# Patient Record
Sex: Female | Born: 1980 | Hispanic: Yes | Marital: Single | Smoking: Never smoker
Health system: Southern US, Community
[De-identification: ages and names within clinical notes are randomized; demographics above are authoritative.]

## PROBLEM LIST (undated history)

## (undated) DIAGNOSIS — Z21 Asymptomatic human immunodeficiency virus [HIV] infection status: Secondary | ICD-10-CM

## (undated) DIAGNOSIS — B20 Human immunodeficiency virus [HIV] disease: Secondary | ICD-10-CM

## (undated) DIAGNOSIS — B999 Unspecified infectious disease: Secondary | ICD-10-CM

## (undated) DIAGNOSIS — F32A Depression, unspecified: Secondary | ICD-10-CM

## (undated) HISTORY — PX: NO PAST SURGERIES: SHX2092

## (undated) HISTORY — DX: Asymptomatic human immunodeficiency virus (hiv) infection status: Z21

## (undated) HISTORY — DX: Depression, unspecified: F32.A

## (undated) HISTORY — DX: Unspecified infectious disease: B99.9

## (undated) HISTORY — DX: Human immunodeficiency virus (HIV) disease: B20

---

## 2009-11-06 ENCOUNTER — Encounter: Payer: Self-pay | Admitting: Internal Medicine

## 2009-11-06 LAB — HIV ANTIBODY (ROUTINE TESTING W REFLEX): HIV: REACTIVE

## 2009-11-24 ENCOUNTER — Encounter: Payer: Self-pay | Admitting: Internal Medicine

## 2009-12-12 ENCOUNTER — Ambulatory Visit: Payer: Self-pay | Admitting: Internal Medicine

## 2009-12-12 DIAGNOSIS — B20 Human immunodeficiency virus [HIV] disease: Secondary | ICD-10-CM

## 2009-12-12 LAB — CONVERTED CEMR LAB
Albumin: 4.4 g/dL (ref 3.5–5.2)
Basophils Relative: 0 % (ref 0–1)
Bilirubin Urine: NEGATIVE
CO2: 23 meq/L (ref 19–32)
Cholesterol: 132 mg/dL (ref 0–200)
Eosinophils Relative: 1 % (ref 0–5)
GC Probe Amp, Urine: NEGATIVE
Glucose, Bld: 90 mg/dL (ref 70–99)
HCT: 42 % (ref 36.0–46.0)
HIV-1 antibody: POSITIVE — AB
HIV: REACTIVE
Hemoglobin: 13.8 g/dL (ref 12.0–15.0)
Hep A Total Ab: POSITIVE — AB
Hep B S Ab: NEGATIVE
Ketones, ur: NEGATIVE mg/dL
MCHC: 32.9 g/dL (ref 30.0–36.0)
Monocytes Absolute: 0.3 10*3/uL (ref 0.1–1.0)
Monocytes Relative: 6 % (ref 3–12)
Neutro Abs: 3.2 10*3/uL (ref 1.7–7.7)
Nitrite: NEGATIVE
Potassium: 3.7 meq/L (ref 3.5–5.3)
RBC: 5.14 M/uL — ABNORMAL HIGH (ref 3.87–5.11)
Sodium: 138 meq/L (ref 135–145)
Specific Gravity, Urine: 1.016 (ref 1.005–1.030)
Total Bilirubin: 0.4 mg/dL (ref 0.3–1.2)
Total Protein: 7.4 g/dL (ref 6.0–8.3)
Triglycerides: 176 mg/dL — ABNORMAL HIGH (ref <150)
Urine Glucose: NEGATIVE mg/dL
pH: 8 (ref 5.0–8.0)

## 2009-12-28 ENCOUNTER — Ambulatory Visit: Payer: Self-pay | Admitting: Internal Medicine

## 2009-12-29 ENCOUNTER — Encounter (INDEPENDENT_AMBULATORY_CARE_PROVIDER_SITE_OTHER): Payer: Self-pay | Admitting: *Deleted

## 2010-01-03 ENCOUNTER — Encounter (INDEPENDENT_AMBULATORY_CARE_PROVIDER_SITE_OTHER): Payer: Self-pay | Admitting: *Deleted

## 2010-01-03 ENCOUNTER — Telehealth: Payer: Self-pay | Admitting: Internal Medicine

## 2010-01-04 ENCOUNTER — Encounter (INDEPENDENT_AMBULATORY_CARE_PROVIDER_SITE_OTHER): Payer: Self-pay | Admitting: *Deleted

## 2010-01-08 ENCOUNTER — Encounter (INDEPENDENT_AMBULATORY_CARE_PROVIDER_SITE_OTHER): Payer: Self-pay | Admitting: *Deleted

## 2010-02-08 ENCOUNTER — Encounter (INDEPENDENT_AMBULATORY_CARE_PROVIDER_SITE_OTHER): Payer: Self-pay | Admitting: *Deleted

## 2010-02-12 ENCOUNTER — Encounter (INDEPENDENT_AMBULATORY_CARE_PROVIDER_SITE_OTHER): Payer: Self-pay | Admitting: *Deleted

## 2010-02-12 ENCOUNTER — Ambulatory Visit: Payer: Self-pay | Admitting: Internal Medicine

## 2010-02-12 LAB — CONVERTED CEMR LAB
Albumin: 4.3 g/dL (ref 3.5–5.2)
Alkaline Phosphatase: 61 units/L (ref 39–117)
BUN: 7 mg/dL (ref 6–23)
Eosinophils Absolute: 0.1 10*3/uL (ref 0.0–0.7)
Eosinophils Relative: 2 % (ref 0–5)
Glucose, Bld: 86 mg/dL (ref 70–99)
HCT: 39.8 % (ref 36.0–46.0)
HIV-1 RNA Quant, Log: 2.24 — ABNORMAL HIGH (ref <1.68)
Lymphs Abs: 1.1 10*3/uL (ref 0.7–4.0)
MCV: 84.9 fL (ref 78.0–100.0)
Monocytes Relative: 10 % (ref 3–12)
Potassium: 3.8 meq/L (ref 3.5–5.3)
RBC: 4.69 M/uL (ref 3.87–5.11)
WBC: 4.7 10*3/uL (ref 4.0–10.5)

## 2010-03-01 ENCOUNTER — Ambulatory Visit: Payer: Self-pay | Admitting: Internal Medicine

## 2010-04-11 ENCOUNTER — Encounter (INDEPENDENT_AMBULATORY_CARE_PROVIDER_SITE_OTHER): Payer: Self-pay | Admitting: *Deleted

## 2010-05-30 ENCOUNTER — Ambulatory Visit: Payer: Self-pay | Admitting: Internal Medicine

## 2010-05-30 LAB — CONVERTED CEMR LAB
Albumin: 4.9 g/dL (ref 3.5–5.2)
CO2: 25 meq/L (ref 19–32)
Calcium: 8.9 mg/dL (ref 8.4–10.5)
Chloride: 104 meq/L (ref 96–112)
Eosinophils Relative: 2 % (ref 0–5)
Glucose, Bld: 89 mg/dL (ref 70–99)
HCT: 38.8 % (ref 36.0–46.0)
HIV 1 RNA Quant: <20 copies/mL (ref <20)
HIV-1 RNA Quant, Log: <1.3 (ref <1.30)
Lymphocytes Relative: 20 % (ref 12–46)
Lymphs Abs: 1.5 10*3/uL (ref 0.7–4.0)
Neutro Abs: 5.3 10*3/uL (ref 1.7–7.7)
Neutrophils Relative %: 72 % (ref 43–77)
Platelets: 313 10*3/uL (ref 150–400)
Potassium: 3.9 meq/L (ref 3.5–5.3)
Sodium: 138 meq/L (ref 135–145)
Total Protein: 7.7 g/dL (ref 6.0–8.3)
WBC: 7.4 10*3/uL (ref 4.0–10.5)

## 2010-06-13 ENCOUNTER — Ambulatory Visit: Payer: Self-pay | Admitting: Internal Medicine

## 2010-06-13 DIAGNOSIS — M25569 Pain in unspecified knee: Secondary | ICD-10-CM | POA: Insufficient documentation

## 2010-08-28 ENCOUNTER — Ambulatory Visit
Admission: RE | Admit: 2010-08-28 | Discharge: 2010-08-28 | Payer: Self-pay | Source: Home / Self Care | Attending: Internal Medicine | Admitting: Internal Medicine

## 2010-08-28 ENCOUNTER — Encounter: Payer: Self-pay | Admitting: Adult Health

## 2010-08-28 LAB — CONVERTED CEMR LAB
AST: 23 units/L (ref 0–37)
Albumin: 4.4 g/dL (ref 3.5–5.2)
Alkaline Phosphatase: 94 units/L (ref 39–117)
BUN: 11 mg/dL (ref 6–23)
Basophils Absolute: 0 10*3/uL (ref 0.0–0.1)
Basophils Relative: 1 % (ref 0–1)
CO2: 27 meq/L (ref 19–32)
Calcium: 9.6 mg/dL (ref 8.4–10.5)
Creatinine, Ser: 0.63 mg/dL (ref 0.40–1.20)
HIV-1 RNA Quant, Log: <1.3 (ref <1.30)
MCHC: 33.9 g/dL (ref 30.0–36.0)
Neutro Abs: 3.7 10*3/uL (ref 1.7–7.7)
Neutrophils Relative %: 61 % (ref 43–77)
RBC: 4.28 M/uL (ref 3.87–5.11)
RDW: 12.6 % (ref 11.5–15.5)
Total Bilirubin: 0.2 mg/dL — ABNORMAL LOW (ref 0.3–1.2)
Total Protein: 7.4 g/dL (ref 6.0–8.3)

## 2010-08-28 NOTE — Assessment & Plan Note (Signed)
Summary: F/U/VS   CC:  follow-up visit, lab results, and c/o bilateral knee pain x 6 days.  History of Present Illness: (Interpretor utilized) Pt taking her Atripla every day.  No side effects. C/o bilateral knee pain when she bends down for the last 6 days.  No known injury.  she has not tried any OTC medication for the pain.  Preventive Screening-Counseling & Management  Alcohol-Tobacco     Alcohol drinks/day: 0     Smoking Status: never  Caffeine-Diet-Exercise     Caffeine use/day: occasional soda     Does Patient Exercise: yes     Type of exercise: walking  Safety-Violence-Falls     Seat Belt Use: yes      Sexual History:  n/a.        Drug Use:  never.    Comments: pt. given condoms   Updated Prior Medication List: ATRIPLA 600-200-300 MG TABS (EFAVIRENZ-EMTRICITAB-TENOFOVIR) Take 1 tablet by mouth at bedtime  Current Allergies (reviewed today): No known allergies  Review of Systems  The patient denies anorexia, fever, and weight loss.    Vital Signs:  Patient profile:   30 year old female Height:      60.5 inches (153.67 cm) Weight:      149.12 pounds (67.78 kg) BMI:     28.75 Temp:     98.4 degrees F (36.89 degrees C) oral Pulse rate:   73 / minute BP sitting:   108 / 73  (right arm)  Vitals Entered By: Wendall Mola CMA Duncan Dull) (June 13, 2010 9:42 AM) CC: follow-up visit, lab results, c/o bilateral knee pain x 6 days Is Patient Diabetic? No Pain Assessment Patient in pain? yes     Location: knees Intensity: 5 Type: aching Onset of pain  Intermittent Nutritional Status BMI of 25 - 29 = overweight Nutritional Status Detail appetite "good"  Have you ever been in a relationship where you felt threatened, hurt or afraid?No   Does patient need assistance? Functional Status Self care Ambulation Normal Comments pt. has missed one dose of Atirpla since last visit   Physical Exam  General:  alert, well-developed, well-nourished,  and well-hydrated.   Head:  normocephalic and atraumatic.   Mouth:  pharynx pink and moist.   Msk:  normal ROM, no joint tenderness, no joint swelling, and no joint warmth.     Impression & Recommendations:  Problem # 1:  HIV INFECTION (ICD-042)  Pt.s most recent CD4ct was 430and VL <20 .  Pt instructed to continue the current antiretroviral regimen.  Pt encouraged to take medication regularly and not miss doses.  Pt will f/u in 3 months for repeat blood work and will see me 2 weeks later. Influenza vaccine and third Hepatitis vaccine given.  Diagnostics Reviewed:  HIV: HIV positive - not AIDS (12/28/2009)   HIV-Western blot: Positive (12/12/2009)   CD4: 430 (05/31/2010)   WBC: 7.4 (05/30/2010)   Hgb: 13.1 (05/30/2010)   HCT: 38.8 (05/30/2010)   Platelets: 313 (05/30/2010) HIV genotype: See Comment (12/12/2009)   HIV-1 RNA: <20 copies/mL (05/30/2010)   HBSAg: NEG (12/12/2009)  Orders: Est. Patient Level III (99213)Future Orders: T-CD4SP ([facility]) (CD4SP) ... 09/11/2010 T-HIV Viral Load [id number]) ... 09/11/2010 T-Comprehensive Metabolic Panel [id number]) ... 09/11/2010 T-CBC w/Diff ([id number]) ... 09/11/2010  Problem # 2:  KNEE PAIN, BILATERAL (ICD-719.46) exam nl trial of Ibuprofen  Other Orders: Influenza Vaccine NON MCR (62952) Hepatitis B Vaccine >79yrs (84132) Admin 1st Vaccine (44010)  Patient Instructions: 1)  Please  schedule a follow-up appointment in 3 months, 2 weeks after labs.       Immunizations Administered:  Influenza Vaccine # 1:    Vaccine Type: Fluvax Non-MCR    Site: left deltoid    Mfr: Novartis    Dose: 0.5 ml    Route: IM    Given by: Wendall Mola CMA ( AAMA)    Exp. Date: 10/28/2010    Lot #: [id number]    VIS given: 02/20/10 version given June 13, 2010.  Hepatitis B Vaccine # 3:    Vaccine Type: HepB Adult    Site: right deltoid    Mfr: Merck    Dose: 0.5 ml    Route: IM    Given by: Wendall Mola CMA ( AAMA)     Exp. Date: 07/16/2012    Lot #: [id number]    VIS given: 02/12/06 version given June 13, 2010.  Flu Vaccine Consent Questions:    Do you have a history of severe allergic reactions to this vaccine? no    Any prior history of allergic reactions to egg and/or gelatin? no    Do you have a sensitivity to the preservative Thimersol? no    Do you have a past history of Guillan-Barre Syndrome? no    Do you currently have an acute febrile illness? no    Have you ever had a severe reaction to latex? no    Vaccine information given and explained to patient? yes    Are you currently pregnant? no

## 2010-08-28 NOTE — Assessment & Plan Note (Signed)
Summary: new 042/tkk   CC:  new patient to establish and lab results.  History of Present Illness: (Interpreter utilized)Pt diagnosed HIV (+) in april at her yearly PE. She currently does not have a partner.  Her risk factor would be heterosexual sex. She has been feeling well.  Not currently employed.  Preventive Screening-Counseling & Management  Alcohol-Tobacco     Alcohol drinks/day: 0     Smoking Status: never  Caffeine-Diet-Exercise     Caffeine use/day: 0     Does Patient Exercise: no  Safety-Violence-Falls     Seat Belt Use: yes      Sexual History:  n/a.        Drug Use:  never.     Current Allergies (reviewed today): No known allergies  Social History: Sexual History:  n/a Drug Use:  never  Review of Systems  The patient denies anorexia, fever, weight loss, and dyspnea on exertion.    Vital Signs:  Patient profile:   30 year old female Height:      60.5 inches (153.67 cm) Weight:      152.8 pounds (69.45 kg) BMI:     29.46 Temp:     98.7 degrees F (37.06 degrees C) oral Pulse rate:   87 / minute BP sitting:   118 / 71  (left arm)  Vitals Entered By: Wendall Mola CMA Duncan Dull) (December 28, 2009 2:36 PM) CC: new patient to establish, lab results Is Patient Diabetic? No Pain Assessment Patient in pain? no      Nutritional Status BMI of 25 - 29 = overweight Nutritional Status Detail appetite "very good"  Have you ever been in a relationship where you felt threatened, hurt or afraid?No   Does patient need assistance? Functional Status Self care Ambulation Normal   Physical Exam  General:  alert, well-developed, well-nourished, and well-hydrated.   Head:  normocephalic and atraumatic.   Mouth:  pharynx pink and moist.  no thrush  Neck:  no cervical lymphadenopathy.   Lungs:  normal breath sounds.   Heart:  normal rate, regular rhythm, and no murmur.          Medication Adherence: 12/28/2009   Adherence to medications reviewed with  patient. Counseling to provide adequate adherence provided   Prevention For Positives: 12/28/2009   Safe sex practices discussed with patient. Condoms offered.                             Impression & Recommendations:  Problem # 1:  HIV INFECTION (ICD-042) Discussed pathophysiology of HIV and the meaning of CD4ct and VL.  Pt.s current Cd4ct is 380  and VL is  29,440 and genotype shows a naive virus .  At this Point antiretroviral treatment is indicated . Discussed treatment options.  Pt does not want to get pregnant and is not sexually active. She would like to start Atripla.  Potential side effects were discussed. I will refer her to [name] to get enrolled in a drug program so she can get her medication.  She will return in 6 weeks for repeat labs.  hepatits B vaccine #1 given today.  Discussed safe sex and transmisiion routes with the patient.  Diagnostics Reviewed:  HIV: REACTIVE (12/12/2009)   HIV-Western blot: Positive (12/12/2009)   CD4: 380 (12/13/2009)   WBC: 5.0 (12/12/2009)   Hgb: 13.8 (12/12/2009)   HCT: 42.0 (12/12/2009)   Platelets: 278 (12/12/2009) HIV genotype: See Comment (12/12/2009)  HIV-1 RNA: 29400 (12/12/2009)   HBSAg: NEG (12/12/2009)  Orders: New Patient Level III (99203)Future Orders: T-CBC w/Diff ([id number]) ... 02/27/2010 T-CD4SP ([facility]) (CD4SP) ... 02/27/2010 T-Comprehensive Metabolic Panel [id number]) ... 02/27/2010 T-HIV Viral Load [id number]) ... 02/27/2010  Medications Added to Medication List This Visit: 1)  Atripla 600-200-300 Mg Tabs (Efavirenz-emtricitab-tenofovir) .... Take 1 tablet by mouth at bedtime  Other Orders: Hepatitis B Vaccine >23yrs [phone]) Admin 1st Vaccine (81829) Admin 1st Vaccine [facility]) [phone])  Patient Instructions: 1)  Please schedule a follow-up appointment in 10 weeks, 2 weeks after labs.    Hepatitis B Vaccine # 1    Vaccine Type: HepB Adult    Site: left deltoid    Mfr: Merck    Dose: 0.5 ml    Route:  IM    Given by: Wendall Mola CMA ( AAMA)    Exp. Date: 04/24/2012    Lot #: [id number]    VIS given: 02/12/06 version given December 28, 2009.          Medication Adherence: 12/28/2009   Adherence to medications reviewed with patient. Counseling to provide adequate adherence provided    Prevention For Positives: 12/28/2009   Safe sex practices discussed with patient. Condoms offered.

## 2010-08-28 NOTE — Miscellaneous (Signed)
Summary: Bridge Counselor  Clinical Lists Changes BC met with pt at the [facility] Department today. BC provided HIV education and treatment adherence. BC discussed how and when to take Atripla and the pregnancy warning. Pt does not yet have her medication due to ADAP pending. BC picked up a sample bottle from [place] to deliver to pt tomorrow.  Sharol Roussel  January 04, 2010 4:47 PM

## 2010-08-28 NOTE — Progress Notes (Signed)
Summary: sample given  Phone Note Call from Patient   Caller: Bridge Counselor Summary of Call: Bridge counselor came by to pick up medication to deliver to patient on tomorrow.  Her ADAP application is still pending approval. Initial call taken by: Paulo Fruit  BS,CPht II,MPH,  January 03, 2010 4:18 PM    Prescriptions: ATRIPLA 600-200-300 MG TABS (EFAVIRENZ-EMTRICITAB-TENOFOVIR) Take 1 tablet by mouth at bedtime  #30 x 0   Entered by:   Paulo Fruit  BS,CPht II,MPH   Authorized by:   Yisroel Ramming MD   Signed by:   Paulo Fruit  BS,CPht II,MPH on 01/03/2010   Method used:   Samples Given   RxID:   [id number]   Sample Given, Lot #: [id number] Expiration Date:03 2013 Patient has been instructed regarding the correct time, dose and frequency of taking this med, including desired effects and most common side effects. Paulo Fruit  BS,CPht II,MPH  January 03, 2010 4:19 PM

## 2010-08-28 NOTE — Assessment & Plan Note (Signed)
Summary: F/U [MKJ]   CC:  follow-up visit, lab results, and c/o red spots on right leg.  History of Present Illness: (Interpreter utilized) Pt missed 3 days of her Atripla because of the transfer from [facility] to [facility]. She is tolerating it well.    Preventive Screening-Counseling & Management  Alcohol-Tobacco     Alcohol drinks/day: 0     Smoking Status: never  Caffeine-Diet-Exercise     Caffeine use/day: occasional soda     Does Patient Exercise: no  Safety-Violence-Falls     Seat Belt Use: yes      Sexual History:  n/a.        Drug Use:  never.     Updated Prior Medication List: ATRIPLA 600-200-300 MG TABS (EFAVIRENZ-EMTRICITAB-TENOFOVIR) Take 1 tablet by mouth at bedtime  Current Allergies: No known allergies  Review of Systems  The patient denies anorexia, fever, and weight loss.    Vital Signs:  Patient profile:   30 year old female Height:      60.5 inches (153.67 cm) Weight:      148.12 pounds (67.33 kg) BMI:     28.55 Temp:     98.0 degrees F (36.67 degrees C) oral Pulse rate:   56 / minute BP sitting:   94 / 65  (left arm)  Vitals Entered By: Wendall Mola CMA Duncan Dull) (March 01, 2010 9:39 AM) CC: follow-up visit, lab results, c/o red spots on right leg Is Patient Diabetic? No Pain Assessment Patient in pain? no      Nutritional Status BMI of 25 - 29 = overweight Nutritional Status Detail appetite "good"  Does patient need assistance? Functional Status Self care Ambulation Normal Comments pt. missed one dose of meds since last visit   Physical Exam  General:  alert, well-developed, well-nourished, and well-hydrated.   Head:  normocephalic and atraumatic.   Lungs:  normal breath sounds.      Impression & Recommendations:  Problem # 1:  HIV INFECTION (ICD-042) Pt.s most recent CD4ct was 390 and VL 173 .  Pt instructed to continue the current antiretroviral regimen.  Pt encouraged to take medication regularly and not miss  doses.  Pt will f/u in 3 months for repeat blood work and will see me 2 weeks later. [place] Hepatitis B vaccine given.  Diagnostics Reviewed:  HIV: HIV positive - not AIDS (12/28/2009)   HIV-Western blot: Positive (12/12/2009)   CD4: 390 (02/13/2010)   WBC: 4.7 (02/12/2010)   Hgb: 13.8 (02/12/2010)   HCT: 39.8 (02/12/2010)   Platelets: 278 (02/12/2010) HIV genotype: See Comment (12/12/2009)   HIV-1 RNA: 173 (02/12/2010)   HBSAg: NEG (12/12/2009)  Orders: Est. Patient Level III (99213)Future Orders: T-CD4SP ([facility]) (CD4SP) ... 05/30/2010 T-HIV Viral Load [id number]) ... 05/30/2010 T-Comprehensive Metabolic Panel [id number]) ... 05/30/2010 T-CBC w/Diff ([id number]) ... 05/30/2010  Other Orders: Hepatitis B Vaccine >17yrs (29528) Admin 1st Vaccine (41324)  Patient Instructions: 1)  Please schedule a follow-up appointment in 3 months, 2 weeks after labs.    Immunizations Administered:  Hepatitis B Vaccine # 2:    Vaccine Type: HepB Adult    Site: left deltoid    Mfr: Merck    Dose: 0.5 ml    Route: IM    Given by: Wendall Mola CMA ( AAMA)    Exp. Date: 11/26/2011    Lot #: [id number]    VIS given: 02/12/06 version given March 01, 2010.

## 2010-08-28 NOTE — Miscellaneous (Signed)
Summary: Bridge Counselor  Clinical Lists Changes BC spoke with pt this date. Pt did receive her medication in the mail and has resumed taking them with no problems. BC will provide transportation to pt's appointment on 02/12/10 at 10am.  Selena Batten [place]  February 08, 2010 11:08 AM

## 2010-08-28 NOTE — Miscellaneous (Signed)
Summary: Bridge Counselor  Clinical Lists Changes BC missed pt at her appointment with Dr. Philipp Deputy. BC will meet with pt at the [facility] Department on June 8,2011 to complete pt's assessment.  Sharol Roussel  December 29, 2009 12:41 PM

## 2010-08-28 NOTE — Letter (Signed)
Summary: Catherine Grimes: Income Verification  Catherine Grimes: Income Verification   Imported By: Florinda Marker 01/02/2010 15:37:01  _____________________________________________________________________  External Attachment:    Type:   Image     Comment:   External Document

## 2010-08-28 NOTE — Miscellaneous (Signed)
Summary: Bridge Counselor  Clinical Lists Changes BC provided transportation to [facility] this date for pt's lab work. Pt also brought in several lab bills that were given to [name] D to be passed to [name]. Pt was told to bring her ryan white card to every clinic visit. Pt will return on August 4th to see Dr. Philipp Deputy.  Sharol Roussel  February 12, 2010 11:28 AM

## 2010-08-28 NOTE — Assessment & Plan Note (Signed)
Summary: new 042/spanish speaking only    Other Orders: T-Comprehensive Metabolic Panel [phone]) T-CBC w/Diff [id number]) T-CD4SP [facility]) (CD4SP) T-Chlamydia  Probe, urine [id number]) T-GC Probe, urine [phone]) T-Hepatitis B Surface Antigen [id number]) T-Hepatitis B Surface Antibody 825 108 0224) T-Hepatitis B Core Antibody [id number]) T-Hepatitis C Antibody ([id number]) T-HIV Antibody  (Reflex) ([id number]) T-HIV Ab Confirmatory Test/Western Blot ([id number]) T-Lipid Profile [id number]) T-RPR (Syphilis) (42706-23762) T-Urinalysis (83151-76160) T-HIV1 Quant rflx Ultra or Genotype ([id number]) T-Hepatitis A Antibody (85462-70350) Pneumococcal Vaccine (09381) Admin 1st Vaccine (82993) TB Skin Test (71696) Admin of Any Addtl Vaccine (78938)     Immunizations Administered:  Pneumonia Vaccine:    Vaccine Type: Pneumovax    Site: left deltoid    Mfr: Merck    Dose: 0.5 ml    Route: IM    Given by: Wendall Mola CMA ( AAMA)    Exp. Date: 05/23/2011    Lot #: [id number]  PPD Skin Test:    Vaccine Type: PPD    Site: left forearm    Mfr: Sanofi Pasteur    Dose: 0.1 ml    Route: ID    Given by: Wendall Mola CMA ( AAMA)    Exp. Date: 12/01/2011    Lot #: [id number]   Appended Document: New 042 intake     Clinical Lists Changes  Observations: Added new observation of TB PPDRESULT: negative (12/12/2009 12:36) Added new observation of PPD RESULT: < 5mm (12/12/2009 12:36) Added new observation of TB-PPD RDDTE: 12/14/2009 (12/12/2009 12:36) Added new observation of INFECTDIS MD: Philipp Deputy (12/12/2009 12:36) Added new observation of DATE1STVISIT: 12/12/2009 (12/12/2009 12:36) Added new observation of MARITAL STAT: Single (12/12/2009 12:36) Added new observation of LATINO/HISP: Yes (12/12/2009 12:36) Added new observation of RWPARTICIP: Yes (12/12/2009 12:36) Added new observation of PREGTHISYR: No (12/12/2009 12:36) Added new  observation of SUBSTANCEABU: 12/12/2009 (12/12/2009 12:36) Added new observation of DEPSCREEN: 12/12/2009 (12/12/2009 12:36) Added new observation of EDUCMATPROV: 12/12/2009 (12/12/2009 12:36) Added new observation of PAP SMEAR: unknown (12/12/2009 12:36) Added new observation of LAST PAP DAT: 11/22/2009 (12/12/2009 12:36) Added new observation of SH REVIEWED: Pt reports sexual intercourse wit 3 female patients in the last year.  One in the last six months  (12/12/2009 12:36) Added new observation of DATE OF LMP: 11-07-09 (12/12/2009 12:36) Added new observation of PREVENTPOS: 12/12/2009 (12/12/2009 12:36) Added new observation of GENDER: Female (12/12/2009 12:36) Added new observation of HIV RISK BEH: Heterosexual contact (12/12/2009 12:36) Added new observation of HIV INIT DX: 11/08/2009 (12/12/2009 12:36) Added new observation of DRUG USE: No (12/12/2009 12:36) Added new observation of ALCOHOL COMM: No (12/12/2009 12:36) Added new observation of TOBACCO USE: never (12/12/2009 12:36) Added new observation of PAYOR: No Insurance (12/12/2009 12:36)      Infectious Disease New Patient Intake Referring MD/Agency: [facility] [place]  Address: [address] [place] [address]  Medical Records: Received Health Insurance / Payor: No Insurance Employer: None   Does insurance cover prescriptions? No Our patient has been informed that medication assistance programs are available.  Our Co-ordinator will be meeting with the patient during this visit to discuss financial and medication assistance.   Do you have a Primary physician: No Are family members aware of patient's diagnosis?  If so, are they supportive? Aunt, cousin  Describe patient's current social support (family, friends, support groups): good  Medical History Medical Problems OTHER than HIV: No   Medication Allergies: No   Medications: No   Medical Hx Comments: Spanish speaking only, interpreter  present. Pt  states he had a Pap Smear at  [facility] Dept on 11-22-09   Family History Heart Disease: No Hypertension: No Diabetes: No High Cholesterol: No Kidney Disease: No Cancer: No Thyroid Disease: No  Tobacco Use: never  Behavioral Health Assessment Have you ever been diagnosed with depression or mental illness? No  Do you drink alcohol? No Do you use recreational drugs? No Do you feel you have a problem with drugs and/or alcohol? No   Have you ever been in a treatment facility for any addiction? No If you are currently using drugs and/or alcohol, would you like help for this addiction? No  HIV Intake Information When did you first test positive for HIV? 11/08/2009 Type of test Conducted: WB   Where was this test performed?  Name of Agency: [facility]  City/State: [place] Was this your first time ever being tested or HIV? Yes Risk Factor(s) for HIV: Heterosexual contact  Method of Exposure to HIV: Heterosexual Intercourse Have you ever been hospitalized for any HIV-related condition? No  Have you ever been under the care of a physician for being HIV positive? No  Newly Diagnosed Patients Has a Disease Intervention Specialist from the Health Department contacted the patient? Yes.   The patient has been informed that the [facility] Department will contact ALL newly reported cases. Health Department Contact:  [phone]   (SSN is needed for confirmation)  Reported Today: 12/05/2009 Health Department Contact:  [phone]            (SSN is needed for confirmation)  Person Reporting: prev. reported/tkk Do you have any Non-HIV related medical conditions or other prior hospitalizations or surgeries? No  HIV Medications Information The patient is currently NOT taking any HIV medications.  Infection History  Patient has been diagnosed with the following opportunistic infections: Are there any other symptoms you need to discuss? No Have  you received literature/education prior to this visit about HIV/AIDS? No Do you understand the meaning of a Viral Load? No Do you understand the meaning of a CD4 count? No Lab Values Education/Handout Given Yes Medication Education/Handout Given Yes  Sexual History Are you in a current relationship? No Are you currently sexually active? No If no, when was your last encounter? 10/06/2009 When was your last unprotected sex? 10/06/2009 Safe Sex Counseling/Pamphlet Given Sexual History Comments: Pt reports sexual intercourse wit 3 female patients in the last year.  One in the last six months   Evaluation and Follow-Up INTAKE CHECK LIST: HIV Education, Safe Sex Counseling, Case Management Referral, HIV Material Given, Juanell Fairly Consent  Prevention For Positives: 12/12/2009   Safe sex practices discussed with patient. Condoms offered. Juanell Fairly Consent: Yes Are you in need of condoms at this time? Yes Our patient has been informed that condoms are always available in this clinic.   Are you involved in any social organization? [facility] Providers Name of Agency: Referral made to [place] home care providers  and Bridge Counselors  Does patient have problems that warrant Social Worker referral? No SW Comments: Sharol Roussel will provide pt with transportation and connection to [facility] Providers .  Obstetric/Gynecological History Pap Smear Date: 11/22/2009 Result:  unknown Office/Clinic:  [facility]   Last Menstrual Period: 11-07-09 Hysterectomy:  No  Pregnancy History Gravida: 1 Para: 0           Prevention For Positives: 12/12/2009   Safe sex practices discussed with patient. Condoms offered.  12/12/2009   Patient was screened for substance abuse and depression. Referal was made as indicated.                       PPD Results    Date of reading: 12/14/2009    Results: < 5mm    Interpretation: negative

## 2010-08-28 NOTE — Miscellaneous (Signed)
Summary: HIPAA Restrictions  HIPAA Restrictions   Imported By: Florinda Marker 12/12/2009 14:53:40  _____________________________________________________________________  External Attachment:    Type:   Image     Comment:   External Document

## 2010-08-28 NOTE — Miscellaneous (Signed)
Summary: RW update  Clinical Lists Changes 

## 2010-08-28 NOTE — Miscellaneous (Signed)
Summary: Bridge Counselor  Clinical Lists Changes BC received pt's letter of support via mail this date. BC gave the letter to [name] D for pt's ADAP.  Sharol Roussel  January 08, 2010 9:39 AM

## 2010-08-28 NOTE — Miscellaneous (Signed)
Summary: Bridge Counselor  Clinical Lists Changes BC delivered pt's medication today. BC also reinforced how and when to take the Atripla. BC also gave pt a pillbox. BC assisted pt with preparing a support letter for her ADAP. Pt will have her aunt sign it and mail back to [facility]. BC will give the letter to Paulo Fruit once received.  Sharol Roussel  January 04, 2010 4:49 PM

## 2010-08-29 LAB — T-HELPER CELL (CD4) - (RCID CLINIC ONLY)
CD4 % Helper T Cell: 35 % (ref 33–55)
CD4 T Cell Abs: 650 uL (ref 400–2700)

## 2010-09-10 ENCOUNTER — Encounter (INDEPENDENT_AMBULATORY_CARE_PROVIDER_SITE_OTHER): Payer: Self-pay | Admitting: *Deleted

## 2010-09-11 ENCOUNTER — Ambulatory Visit: Payer: Self-pay | Admitting: Adult Health

## 2010-09-11 ENCOUNTER — Encounter (INDEPENDENT_AMBULATORY_CARE_PROVIDER_SITE_OTHER): Payer: Self-pay | Admitting: *Deleted

## 2010-09-19 NOTE — Miscellaneous (Signed)
Summary: ADAP update  Clinical Lists Changes  Observations: Added new observation of AIDSDAP: Pending-approval for 2012 (09/11/2010 12:36)

## 2010-09-19 NOTE — Miscellaneous (Signed)
  Clinical Lists Changes  Observations: Added new observation of INCOMESOURCE: UNEMPLOYED (09/10/2010 15:58) Added new observation of HOUSEINCOME: 0  (09/10/2010 15:58) Added new observation of #CHILD<18 IN: No  (09/10/2010 15:58) Added new observation of FAMILYSIZE: 1  (09/10/2010 15:58) Added new observation of HOUSING: Stable/permanent  (09/10/2010 15:58)

## 2010-09-19 NOTE — Miscellaneous (Signed)
Summary: ADAP  Clinical Lists Changes BC completed pt's ADAP eligibility paperwork today and passed all documents to Ardis Rowan for submission.

## 2010-09-21 ENCOUNTER — Encounter (INDEPENDENT_AMBULATORY_CARE_PROVIDER_SITE_OTHER): Payer: Self-pay | Admitting: *Deleted

## 2010-09-25 NOTE — Miscellaneous (Signed)
  Clinical Lists Changes  Observations: Added new observation of HOUSING: stable/permanent (09/21/2010 11:33)

## 2010-10-09 LAB — T-HELPER CELL (CD4) - (RCID CLINIC ONLY): CD4 T Cell Abs: 430 uL (ref 400–2700)

## 2010-11-06 ENCOUNTER — Telehealth: Payer: Self-pay | Admitting: *Deleted

## 2010-11-06 NOTE — Telephone Encounter (Signed)
rec'd message from [place], our interpretor. Pt c/o urinary burning & pain with voids x 2 wks. Worse over the past 4 days. She does not know if pt has a pcp. She will ask her. If so, go to pcp. If not, go to Urgent Care as we have no appts for today & pt needs to be seen today. She will relay this

## 2010-11-08 ENCOUNTER — Ambulatory Visit (INDEPENDENT_AMBULATORY_CARE_PROVIDER_SITE_OTHER): Payer: Self-pay | Admitting: Adult Health

## 2010-11-08 ENCOUNTER — Encounter: Payer: Self-pay | Admitting: Adult Health

## 2010-11-08 VITALS — BP 123/67 | HR 78 | Temp 98.1°F | Wt 145.0 lb

## 2010-11-08 DIAGNOSIS — B20 Human immunodeficiency virus [HIV] disease: Secondary | ICD-10-CM

## 2010-11-08 MED ORDER — EMTRICITAB-RILPIVIR-TENOFOV DF 200-25-300 MG PO TABS: 1.0000 | ORAL_TABLET | Freq: Every day | ORAL | Status: DC

## 2010-11-08 NOTE — Progress Notes (Signed)
  Subjective:    Patient ID: Catherine Grimes, female    DOB: January 14, 1981, 30 y.o.   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  She asked questions regarding risk of infection to infant, risk of infection to her partner, and general risks to her overall health should she become pregnant.    Review of Systems     Objective:   Physical Exam        Assessment & Plan:  A detailed discussion with her, her pastor's wife, and through the interpreter was made.  We discussed the overall risks of vertical transmission with a mother who is virologically controlled and immunocompetent as being significantly low.  We also discussed the risk of infecting her partner as being low as long as she maintains complete virologic suppression as she has been doing.  While it is highly likely she would give normal delivery and carry to term without complication given her current state of good health, we did emphasize that obstetricians still consider pregnancy in HIV as "high risk."  She verbally acknowledged this information.   We also addressed the concern of being on certain medications that may cause fetal abnormalities such as the Atripla she is currently taking (Cat.D).  As she is showing interest in becoming pregnant when she is married, we decided to switch her off Atripla and begin Complera 200-25-300 1 tab qd (Cat. B).  We suggested that at the time of pregnancy, we may consider another treatment option, but for now Complera therapy would be safe enough with the least amount of changes in her treatment schedule.  Drug effects, regimen, SE's, and ADR's reviewed.  Verbally acknowledged information and agreed with plan.  We will have her RTC in 4 weeks to recheck staging labs with a f/u in 6 weeks.

## 2010-11-12 ENCOUNTER — Ambulatory Visit: Payer: Self-pay | Admitting: Adult Health

## 2010-12-14 ENCOUNTER — Inpatient Hospital Stay (HOSPITAL_COMMUNITY): Admission: RE | Admit: 2010-12-14 | Payer: Self-pay | Source: Ambulatory Visit

## 2010-12-14 ENCOUNTER — Ambulatory Visit (INDEPENDENT_AMBULATORY_CARE_PROVIDER_SITE_OTHER): Payer: Self-pay | Admitting: *Deleted

## 2010-12-14 DIAGNOSIS — Z124 Encounter for screening for malignant neoplasm of cervix: Secondary | ICD-10-CM

## 2010-12-14 DIAGNOSIS — R87610 Atypical squamous cells of undetermined significance on cytologic smear of cervix (ASC-US): Secondary | ICD-10-CM

## 2010-12-14 NOTE — Progress Notes (Signed)
  Subjective:     Catherine Grimes is a 30 y.o. woman who comes in today for a  pap smear only.  Review of Systems   Objective:    There were no vitals taken for this visit. Pelvic Exam: Pap smear obtained.   Assessment:    Screening pap smear.   Plan:    Follow up in one year or as indicated by Pap results.

## 2010-12-14 NOTE — Patient Instructions (Signed)
  Your results will be back in a week.  I will send you a letter.  Thank you for coming to the Center for your care

## 2010-12-20 ENCOUNTER — Other Ambulatory Visit: Payer: Self-pay

## 2010-12-20 ENCOUNTER — Encounter: Payer: Self-pay | Admitting: *Deleted

## 2010-12-27 ENCOUNTER — Other Ambulatory Visit (INDEPENDENT_AMBULATORY_CARE_PROVIDER_SITE_OTHER): Payer: Self-pay

## 2010-12-27 ENCOUNTER — Other Ambulatory Visit: Payer: Self-pay | Admitting: Infectious Diseases

## 2010-12-27 DIAGNOSIS — B20 Human immunodeficiency virus [HIV] disease: Secondary | ICD-10-CM

## 2010-12-28 LAB — COMPREHENSIVE METABOLIC PANEL
CO2: 29 mEq/L (ref 19–32)
Creat: 0.79 mg/dL (ref 0.40–1.20)
Glucose, Bld: 79 mg/dL (ref 70–99)
Sodium: 140 mEq/L (ref 135–145)
Total Bilirubin: 0.2 mg/dL — ABNORMAL LOW (ref 0.3–1.2)
Total Protein: 7.1 g/dL (ref 6.0–8.3)

## 2010-12-28 LAB — CBC WITH DIFFERENTIAL/PLATELET
Eosinophils Absolute: 0.2 10*3/uL (ref 0.0–0.7)
Eosinophils Relative: 3 % (ref 0–5)
HCT: 37.6 % (ref 36.0–46.0)
Hemoglobin: 12.7 g/dL (ref 12.0–15.0)
Lymphs Abs: 2 10*3/uL (ref 0.7–4.0)
MCH: 29.8 pg (ref 26.0–34.0)
MCV: 88.3 fL (ref 78.0–100.0)
Monocytes Relative: 6 % (ref 3–12)
RBC: 4.26 MIL/uL (ref 3.87–5.11)

## 2010-12-31 LAB — HIV-1 RNA QUANT-NO REFLEX-BLD
HIV 1 RNA Quant: <20 copies/mL (ref <20)
HIV-1 RNA Quant, Log: <1.3 {Log} (ref <1.30)

## 2011-01-01 ENCOUNTER — Encounter: Payer: Self-pay | Admitting: *Deleted

## 2011-01-01 ENCOUNTER — Telehealth: Payer: Self-pay | Admitting: *Deleted

## 2011-01-01 NOTE — Telephone Encounter (Signed)
Patient mailed letter in Spanish with appointment date and time at [facility].  Scheduled for 02/25/11 at 2:45 pm Wendall Mola CMA

## 2011-01-08 ENCOUNTER — Ambulatory Visit: Payer: Self-pay | Admitting: Adult Health

## 2011-01-10 ENCOUNTER — Ambulatory Visit (INDEPENDENT_AMBULATORY_CARE_PROVIDER_SITE_OTHER): Payer: Self-pay | Admitting: Adult Health

## 2011-01-10 ENCOUNTER — Encounter: Payer: Self-pay | Admitting: Adult Health

## 2011-01-10 VITALS — BP 91/57 | HR 79 | Temp 98.5°F | Wt 149.0 lb

## 2011-01-10 DIAGNOSIS — B20 Human immunodeficiency virus [HIV] disease: Secondary | ICD-10-CM

## 2011-01-10 NOTE — Progress Notes (Signed)
  Subjective:    Patient ID: Catherine Grimes, female    DOB: 06-May-1981, 30 y.o.   MRN: [id number]  HPI Presents to clinic for laboratory followup. Adherent to her new ARV regimen with good tolerance and no complications. Voices no complaints today.   Review of Systems  Constitutional: Negative.   HENT: Negative.   Eyes: Negative.   Respiratory: Negative.   Cardiovascular: Negative.   Gastrointestinal: Negative.   Genitourinary: Negative.   Musculoskeletal: Negative.   Skin: Negative.   Neurological: Negative.   Hematological: Negative.   Psychiatric/Behavioral: Negative.        Objective:   Physical Exam  Constitutional: She is oriented to person, place, and time. She appears well-developed and well-nourished. No distress.  HENT:  Head: Normocephalic and atraumatic.  Eyes: Conjunctivae and EOM are normal. Pupils are equal, round, and reactive to light.  Neck: Normal range of motion. Neck supple.  Cardiovascular: Normal rate and regular rhythm.   Pulmonary/Chest: Effort normal.  Abdominal: Soft. Bowel sounds are normal.  Musculoskeletal: Normal range of motion.  Neurological: She is alert and oriented to person, place, and time. No cranial nerve deficit. She exhibits normal muscle tone. Coordination normal.  Skin: Skin is warm and dry.  Psychiatric: She has a normal mood and affect. Her behavior is normal. Judgment and thought content normal.          Assessment & Plan:  1. HIV. Labs obtained 12/27/2010 show a CD4 count of 790 at 39% with a viral load of less than 20 copies/mL. Clinically stable on current regimen. Recommend continuing present management, repeating labs in 6 weeks for followup in 2 months. Verbally acknowledged this information and agreed with plan of care.

## 2011-01-31 ENCOUNTER — Other Ambulatory Visit: Payer: Self-pay

## 2011-02-25 ENCOUNTER — Encounter: Payer: Self-pay | Admitting: Family Medicine

## 2011-03-05 ENCOUNTER — Other Ambulatory Visit (INDEPENDENT_AMBULATORY_CARE_PROVIDER_SITE_OTHER): Payer: Self-pay

## 2011-03-05 ENCOUNTER — Other Ambulatory Visit: Payer: Self-pay | Admitting: Adult Health

## 2011-03-05 DIAGNOSIS — B20 Human immunodeficiency virus [HIV] disease: Secondary | ICD-10-CM

## 2011-03-06 LAB — T-HELPER CELL (CD4) - (RCID CLINIC ONLY): CD4 T Cell Abs: 940 uL (ref 400–2700)

## 2011-03-06 LAB — CBC WITH DIFFERENTIAL/PLATELET
Basophils Absolute: 0 10*3/uL (ref 0.0–0.1)
Basophils Relative: 0 % (ref 0–1)
Eosinophils Absolute: 0.2 10*3/uL (ref 0.0–0.7)
Eosinophils Relative: 3 % (ref 0–5)
Lymphocytes Relative: 35 % (ref 12–46)
MCHC: 33.6 g/dL (ref 30.0–36.0)
MCV: 87.1 fL (ref 78.0–100.0)
Platelets: 314 10*3/uL (ref 150–400)
RDW: 13 % (ref 11.5–15.5)
WBC: 7 10*3/uL (ref 4.0–10.5)

## 2011-03-06 LAB — COMPLETE METABOLIC PANEL WITH GFR
ALT: 42 U/L — ABNORMAL HIGH (ref 0–35)
AST: 24 U/L (ref 0–37)
Alkaline Phosphatase: 72 U/L (ref 39–117)
Creat: 0.7 mg/dL (ref 0.50–1.10)
Total Bilirubin: 0.3 mg/dL (ref 0.3–1.2)

## 2011-03-07 LAB — HIV-1 RNA QUANT-NO REFLEX-BLD
HIV 1 RNA Quant: <20 copies/mL (ref <20)
HIV-1 RNA Quant, Log: <1.3 {Log} (ref <1.30)

## 2011-03-19 ENCOUNTER — Encounter: Payer: Self-pay | Admitting: Adult Health

## 2011-03-19 ENCOUNTER — Ambulatory Visit (INDEPENDENT_AMBULATORY_CARE_PROVIDER_SITE_OTHER): Payer: Self-pay | Admitting: Adult Health

## 2011-03-19 ENCOUNTER — Ambulatory Visit: Payer: Self-pay

## 2011-03-19 VITALS — BP 92/56 | HR 63 | Temp 98.7°F | Ht 64.0 in | Wt 141.0 lb

## 2011-03-19 DIAGNOSIS — Z23 Encounter for immunization: Secondary | ICD-10-CM

## 2011-03-19 DIAGNOSIS — B20 Human immunodeficiency virus [HIV] disease: Secondary | ICD-10-CM

## 2011-03-19 NOTE — Patient Instructions (Signed)
Followup with Juanell Fairly, and ADAP eligibility with Mrs. Jeraldine Loots today.

## 2011-03-27 ENCOUNTER — Ambulatory Visit: Payer: Self-pay

## 2011-04-29 ENCOUNTER — Other Ambulatory Visit: Payer: Self-pay | Admitting: *Deleted

## 2011-04-29 DIAGNOSIS — B20 Human immunodeficiency virus [HIV] disease: Secondary | ICD-10-CM

## 2011-04-29 MED ORDER — EMTRICITAB-RILPIVIR-TENOFOV DF 200-25-300 MG PO TABS: 1.0000 | ORAL_TABLET | Freq: Every day | ORAL | Status: DC

## 2011-07-02 ENCOUNTER — Other Ambulatory Visit (INDEPENDENT_AMBULATORY_CARE_PROVIDER_SITE_OTHER): Payer: Self-pay

## 2011-07-02 ENCOUNTER — Other Ambulatory Visit: Payer: Self-pay | Admitting: Infectious Diseases

## 2011-07-02 DIAGNOSIS — B20 Human immunodeficiency virus [HIV] disease: Secondary | ICD-10-CM

## 2011-07-03 ENCOUNTER — Other Ambulatory Visit: Payer: Self-pay

## 2011-07-03 LAB — CBC WITH DIFFERENTIAL/PLATELET
Basophils Absolute: 0 10*3/uL (ref 0.0–0.1)
Eosinophils Absolute: 0.2 10*3/uL (ref 0.0–0.7)
Lymphocytes Relative: 22 % (ref 12–46)
Lymphs Abs: 1.5 10*3/uL (ref 0.7–4.0)
Neutrophils Relative %: 67 % (ref 43–77)
Platelets: 346 10*3/uL (ref 150–400)
RBC: 4.27 MIL/uL (ref 3.87–5.11)
RDW: 13.5 % (ref 11.5–15.5)
WBC: 7 10*3/uL (ref 4.0–10.5)

## 2011-07-03 LAB — COMPLETE METABOLIC PANEL WITH GFR
ALT: 47 U/L — ABNORMAL HIGH (ref 0–35)
AST: 30 U/L (ref 0–37)
CO2: 24 mEq/L (ref 19–32)
Chloride: 106 mEq/L (ref 96–112)
Creat: 0.77 mg/dL (ref 0.50–1.10)
GFR, Est African American: >89 mL/min
Sodium: 139 mEq/L (ref 135–145)
Total Bilirubin: 0.3 mg/dL (ref 0.3–1.2)
Total Protein: 7 g/dL (ref 6.0–8.3)

## 2011-07-03 LAB — T-HELPER CELL (CD4) - (RCID CLINIC ONLY): CD4 T Cell Abs: 590 uL (ref 400–2700)

## 2011-07-04 LAB — HIV-1 RNA QUANT-NO REFLEX-BLD: HIV-1 RNA Quant, Log: <1.3 {Log} (ref <1.30)

## 2011-07-17 ENCOUNTER — Other Ambulatory Visit: Payer: Self-pay | Admitting: Infectious Diseases

## 2011-07-17 ENCOUNTER — Encounter: Payer: Self-pay | Admitting: Infectious Diseases

## 2011-07-17 ENCOUNTER — Ambulatory Visit (INDEPENDENT_AMBULATORY_CARE_PROVIDER_SITE_OTHER): Payer: Self-pay | Admitting: Infectious Diseases

## 2011-07-17 DIAGNOSIS — Z79899 Other long term (current) drug therapy: Secondary | ICD-10-CM

## 2011-07-17 DIAGNOSIS — N912 Amenorrhea, unspecified: Secondary | ICD-10-CM

## 2011-07-17 DIAGNOSIS — B20 Human immunodeficiency virus [HIV] disease: Secondary | ICD-10-CM

## 2011-07-17 DIAGNOSIS — Z113 Encounter for screening for infections with a predominantly sexual mode of transmission: Secondary | ICD-10-CM

## 2011-07-17 NOTE — Assessment & Plan Note (Signed)
She is doing very well on her current/new ART. She is given condoms and instructed that she must use condoms until she wants to get pregnant. She is advised to use her seat belt as well. She will rtc in 5-6 months. Her vax are up to date.

## 2011-07-17 NOTE — Progress Notes (Signed)
  Subjective:    Patient ID: Catherine Grimes, female    DOB: 09/12/1980, 30 y.o.   MRN: [id number]  HPI 30 yo F with hx of HIV+, prev on atripla, wants to get pregnant. Recently changed to complera.    CD4 590, VL <20 (07-02-11). No problems with meds- using the bathroom without difficulty, no problems with urine. No abn dreams.  Not married, having irregular periods, every 5 months.    Review of Systems     Objective:   Physical Exam  Constitutional: She appears well-developed and well-nourished.  HENT:  Mouth/Throat: No oropharyngeal exudate.  Eyes: EOM are normal. Pupils are equal, round, and reactive to light. No scleral icterus.  Neck: Neck supple.  Cardiovascular: Normal rate, regular rhythm and normal heart sounds.   Pulmonary/Chest: Effort normal and breath sounds normal.  Abdominal: Soft. Bowel sounds are normal. She exhibits no distension. There is no tenderness.  Musculoskeletal: She exhibits no edema.  Lymphadenopathy:    She has no cervical adenopathy.          Assessment & Plan:

## 2011-07-17 NOTE — Assessment & Plan Note (Signed)
Will refer her to OB/GYN. Will check her HgBA1C, TSH, and pregnancy test.

## 2011-07-18 LAB — HEMOGLOBIN A1C
Hgb A1c MFr Bld: 5.3 % (ref <5.7)
Mean Plasma Glucose: 105 mg/dL (ref <117)

## 2011-07-18 LAB — HCG, SERUM, QUALITATIVE: Preg, Serum: POSITIVE

## 2011-07-19 ENCOUNTER — Telehealth: Payer: Self-pay

## 2011-07-19 NOTE — Telephone Encounter (Signed)
Patient was contacted via Interpretation line and informed of her positive pregnancy test.   She was told to continue the Complera and go to the [facility] Dept for referral for Pregnancy Medicaid so she can be under care of OB physician. Follow up with Dr Ninetta Lights as planned.  Call our office if she has any problem . Someone from our office will call her on Monday to give more information.  Laurell Josephs, RN IV

## 2011-07-22 ENCOUNTER — Telehealth: Payer: Self-pay | Admitting: *Deleted

## 2011-07-22 NOTE — Telephone Encounter (Signed)
Spoke with patient via interpretor line and she said she is living in [place] now.  I gave her the address to the [facility] Department and phone number.  Referral for OB sent to [facility] clinic.  I will call her back as soon as they schedule her appointment at [facility]. Wendall Mola CMA

## 2011-07-22 NOTE — Telephone Encounter (Signed)
Patient notified of her pregnancy through interpretor Raynelle Fanning with [facility] on 07/18/11.  Raynelle Fanning cell number [phone]. Wendall Mola CMA

## 2011-07-25 ENCOUNTER — Telehealth: Payer: Self-pay | Admitting: *Deleted

## 2011-07-25 NOTE — Telephone Encounter (Signed)
Patient notified via Georgiann Mccoy, of her appointment at [facility] on 08/13/11 at 8:15 AM. Wendall Mola CMA

## 2011-07-25 NOTE — Progress Notes (Signed)
Patient referred to [facility], appt. Scheduled for 08/13/11 at 8:15 AM.  Patient notified via interpretor. Wendall Mola CMA

## 2011-07-30 NOTE — L&D Delivery Note (Signed)
Delivery Note Progressed quickly to complete dilation and only pushed a few times.  At 11:30 PM a viable and healthy female was delivered via Vaginal, Spontaneous Delivery (Presentation:OA ;  ).  APGAR: 9/9, ; weight .   Placenta status: spontaneous and grossly intact with 3 vessel Cord:    No difficulty with shoulders, compound hand noted at birth.  Anesthesia:  none Episiotomy: none Lacerations: small periurethral, not bleeding,not repaired Suture Repair: none Est. Blood Loss (mL): 100  Mom to postpartum.  Baby to nursery-stable.  [facility] 02/23/2012, 11:40 PM

## 2011-08-27 ENCOUNTER — Ambulatory Visit (INDEPENDENT_AMBULATORY_CARE_PROVIDER_SITE_OTHER): Payer: Self-pay | Admitting: *Deleted

## 2011-08-27 DIAGNOSIS — B20 Human immunodeficiency virus [HIV] disease: Secondary | ICD-10-CM

## 2011-08-27 DIAGNOSIS — O0990 Supervision of high risk pregnancy, unspecified, unspecified trimester: Secondary | ICD-10-CM

## 2011-08-27 NOTE — Progress Notes (Signed)
In for initial prenatal hollisters. All Labs and initial prenatal done. She wants to think about flu shot, not sure if she's had it or not she will check and let us know.  Ultrasound scheduled for tomorrow at 330. Pt is feeling well, no problems at this time. Education Booklets given.

## 2011-08-28 ENCOUNTER — Ambulatory Visit (HOSPITAL_COMMUNITY)
Admission: RE | Admit: 2011-08-28 | Discharge: 2011-08-28 | Disposition: A | Discharge status: Home / Self Care | Payer: Self-pay | Source: Ambulatory Visit | Attending: Obstetrics and Gynecology | Admitting: Obstetrics and Gynecology

## 2011-08-28 DIAGNOSIS — O98519 Other viral diseases complicating pregnancy, unspecified trimester: Secondary | ICD-10-CM | POA: Insufficient documentation

## 2011-08-28 DIAGNOSIS — Z21 Asymptomatic human immunodeficiency virus [HIV] infection status: Secondary | ICD-10-CM | POA: Insufficient documentation

## 2011-08-28 DIAGNOSIS — Z3689 Encounter for other specified antenatal screening: Secondary | ICD-10-CM | POA: Insufficient documentation

## 2011-08-28 DIAGNOSIS — B20 Human immunodeficiency virus [HIV] disease: Secondary | ICD-10-CM

## 2011-08-28 LAB — OBSTETRIC PANEL
Antibody Screen: NEGATIVE
Basophils Absolute: 0 10*3/uL (ref 0.0–0.1)
Basophils Relative: 1 % (ref 0–1)
Eosinophils Absolute: 0.1 10*3/uL (ref 0.0–0.7)
Eosinophils Relative: 2 % (ref 0–5)
HCT: 37.6 % (ref 36.0–46.0)
MCH: 29.2 pg (ref 26.0–34.0)
MCHC: 34.8 g/dL (ref 30.0–36.0)
MCV: 83.9 fL (ref 78.0–100.0)
Monocytes Absolute: 0.3 10*3/uL (ref 0.1–1.0)
Platelets: 239 10*3/uL (ref 150–400)
RDW: 12.9 % (ref 11.5–15.5)
Rubella: 87.5 IU/mL — ABNORMAL HIGH
WBC: 3.6 10*3/uL — ABNORMAL LOW (ref 4.0–10.5)

## 2011-08-28 LAB — GLUCOSE TOLERANCE, 1 HOUR: Glucose, 1 Hour GTT: 107 mg/dL (ref 70–140)

## 2011-08-29 LAB — HEMOGLOBINOPATHY EVALUATION: Hemoglobin Other: 0 %

## 2011-08-29 LAB — CULTURE, OB URINE: Colony Count: NO GROWTH

## 2011-09-02 NOTE — Progress Notes (Signed)
This encounter was created in error - please disregard.

## 2011-09-03 NOTE — Assessment & Plan Note (Addendum)
Clinically stable on current regimen. Continue present management.  Counseling provided on prevention of transmission of HIV. Condoms offered:  Yes Medication adherence discussed with patient.  Follow up visit in 4 months with labs 2 weeks prior to appointment. Patient acknowledged information provided to them and agreed with plan of care.    

## 2011-09-03 NOTE — Progress Notes (Signed)
Subjective:    Patient ID: Catherine Grimes is a 31 y.o. female.  Chief Complaint: HIV Follow-up Visit Catherine Grimes is here for follow-up of HIV infection. She is feeling unchanged since her last visit.  She claims continued adherence to therapy with good tolerance and no complications. There are not additional complaints. She is asking how long must she wait before she can become pregnant.  Data Review: Diagnostic studies reviewed.  Review of Systems - General ROS: negative for - fatigue, fever, hot flashes or malaise Psychological ROS: negative for - anxiety, behavioral disorder, concentration difficulties, depression or mood swings Ophthalmic ROS: negative ENT ROS: negative Respiratory ROS: no cough, shortness of breath, or wheezing Cardiovascular ROS: no chest pain or dyspnea on exertion Gastrointestinal ROS: no abdominal pain, change in bowel habits, or black or bloody stools Musculoskeletal ROS: negative Neurological ROS: no TIA or stroke symptoms Dermatological ROS: negative for rash and skin lesion changes  Objective:   General appearance: alert and cooperative Head: Normocephalic, without obvious abnormality, atraumatic Eyes: conjunctivae/corneas clear. PERRL, EOM's intact. Fundi benign. Ears: normal TM's and external ear canals both ears Throat: lips, mucosa, and tongue normal; teeth and gums normal Resp: clear to auscultation bilaterally Chest wall: no tenderness Cardio: regular rate and rhythm, S1, S2 normal, no murmur, click, rub or gallop GI: soft, non-tender; bowel sounds normal; no masses,  no organomegaly Extremities: extremities normal, atraumatic, no cyanosis or edema Skin: Skin color, texture, turgor normal. No rashes or lesions Neurologic: Grossly normal Psych:  No vegetative signs or delusional behaviors noted.    Laboratory: From 03/05/2011 ,  CD4 count was 940 c/cmm @ 38 %. Viral load <20 copies/ml.     Assessment/Plan:   HIV  INFECTION Clinically stable on current regimen. Continue present management.  Counseling provided on prevention of transmission of HIV. Condoms offered:  Yes Medication adherence discussed with patient. Informed her that for safety sake, she should refill an from getting pregnant, between 4-6 months after the last dose of her Atripla until then, she should use condoms.  Follow up visit in 4 months with labs 2 weeks prior to appointment. Patient acknowledged information provided to them and agreed with plan of care.      Shawntay Prest A. Sundra Aland, MS, [facility] Infectious Disease [phone]  09/03/2011, 9:05 AM

## 2011-09-05 ENCOUNTER — Ambulatory Visit (INDEPENDENT_AMBULATORY_CARE_PROVIDER_SITE_OTHER): Payer: Self-pay | Admitting: Family Medicine

## 2011-09-05 ENCOUNTER — Encounter: Payer: Self-pay | Admitting: Family Medicine

## 2011-09-05 DIAGNOSIS — O0990 Supervision of high risk pregnancy, unspecified, unspecified trimester: Secondary | ICD-10-CM

## 2011-09-05 DIAGNOSIS — B20 Human immunodeficiency virus [HIV] disease: Secondary | ICD-10-CM

## 2011-09-05 DIAGNOSIS — Z21 Asymptomatic human immunodeficiency virus [HIV] infection status: Secondary | ICD-10-CM

## 2011-09-05 DIAGNOSIS — O98519 Other viral diseases complicating pregnancy, unspecified trimester: Secondary | ICD-10-CM

## 2011-09-05 DIAGNOSIS — O98719 Human immunodeficiency virus [HIV] disease complicating pregnancy, unspecified trimester: Secondary | ICD-10-CM | POA: Insufficient documentation

## 2011-09-05 DIAGNOSIS — R87610 Atypical squamous cells of undetermined significance on cytologic smear of cervix (ASC-US): Secondary | ICD-10-CM

## 2011-09-05 LAB — POCT URINALYSIS DIP (DEVICE)
Bilirubin Urine: NEGATIVE
Glucose, UA: NEGATIVE mg/dL
Ketones, ur: NEGATIVE mg/dL

## 2011-09-05 NOTE — Patient Instructions (Signed)
Embarazo - Segundo trimestre (Pregnancy - Second Trimester) El segundo trimestre del embarazo (del 3 al 6mes) es un perodo de evolucin rpida para usted y el beb. Hacia el final del sexto mes, el beb mide aproximadamente 23 cm y pesa 680 g. Comenzar a sentir los movimientos del beb entre las 18 y las 20 semanas de embarazo. Podr sentir las pataditas ("quickening en ingls"). Hay un rpido aumento de peso. Puede segregar un lquido claro (calostro) de las mamas. Quizs sienta pequeas contracciones en el vientre (tero) Esto se conoce como falso trabajo de parto o contracciones de Braxton-Hicks. Es como una prctica del trabajo de parto que se produce cuando el beb est listo para salir. Generalmente los problemas de vmitos matinales ya se han superado hacia el final del primer trimestre. Algunas mujeres desarrollan pequeas manchas oscuras (que se denominan cloasma, mscara del embarazo) en la cara que normalmente se van luego del nacimiento del beb. La exposicin al sol empeora las manchas. Puede desarrollarse acn en algunas mujeres embarazadas, y puede desaparecer en aquellas que ya tienen acn. EXAMENES PRENATALES  Durante los exmenes prenatales, deber seguir realizando pruebas de sangre, segn avance el embarazo. Estas pruebas se realizan para controlar su salud y la del beb. Tambin se realizan anlisis de sangre para conocer los niveles de hemoglobina. La anemia (bajo nivel de hemoglobina) es frecuente durante el embarazo. Para prevenirla, se administran hierro y vitaminas. Tambin se le realizarn exmenes para saber si tiene diabetes entre las 24 y las 28 semanas del embarazo. Podrn repetirle algunas de las pruebas que le hicieron previamente.   En cada visita le medirn el tamao del tero. Esto se realiza para asegurarse de que el beb est creciendo correctamente de acuerdo al estado del embarazo.   Tambin en cada visita prenatal controlarn su presin arterial. Esto se  realiza para asegurarse de que no tenga toxemia.   Se controlar su orina para asegurarse de que no tenga infecciones, diabetes o protena en la orina.   Se controlar su peso regularmente para asegurarse que el aumento ocurre al ritmo indicado. Esto se hace para asegurarse que usted y el beb tienen una evolucin normal.   En algunas ocasiones se realiza una prueba de ultrasonido para confirmar el correcto desarrollo y evolucin del beb. Esta prueba se realiza con ondas sonoras inofensivas para el beb, de modo que el profesional pueda calcular ms precisamente la fecha del parto.  Algunas veces se realizan pruebas especializadas del lquido amnitico que rodea al beb. Esta prueba se denomina amniocentesis. El lquido amnitico se obtiene introduciendo una aguja en el vientre (abdomen). Se realiza para controlar los cromosomas en aquellos casos en los que existe alguna preocupacin acerca de algn problema gentico que pueda sufrir el beb. En ocasiones se lleva a cabo cerca del final del embarazo, si es necesario inducir al parto. En este caso se realiza para asegurarse que los pulmones del beb estn lo suficientemente maduros como para que pueda vivir fuera del tero. CAMBIOS QUE OCURREN EN EL SEGUNDO TRIMESTRE DEL EMBARAZO Su organismo atravesar numerosos cambios durante el embarazo. Estos pueden variar de una persona a otra. Converse con el profesional que la asiste acerca los cambios que usted note y que la preocupen.  Durante el segundo trimestre probablemente sienta un aumento del apetito. Es normal tener "antojos" de ciertas comidas. Esto vara de una persona a otra y de un embarazo a otro.   El abdomen inferior comenzar a abultarse.   Podr tener la necesidad   de orinar con ms frecuencia debido a que el tero y el beb presionan sobre la vejiga. Tambin es frecuente contraer ms infecciones urinarias durante el embarazo (dolor al orinar). Puede evitarlas bebiendo gran cantidad de  lquidos y vaciando la vejiga antes y despus de mantener relaciones sexuales.   Podrn aparecer las primeras estras en las caderas, abdomen y mamas. Estos son cambios normales del cuerpo durante el embarazo. No existen medicamentos ni ejercicios que puedan prevenir estos cambios.   Es posible que comience a desarrollar venas inflamadas y abultadas (varices) en las piernas. El uso de medias de descanso, elevar sus pies durante 15 minutos, 3 a 4 veces al da y limitar la sal en su dieta ayuda a aliviar el problema.   Podr sentir acidez gstrica a medida que el tero crece y presiona contra el estmago. Puede tomar anticidos, con la autorizacin de su mdico, para aliviar este problema. Tambin es til ingerir pequeas comidas 4 a 5 veces al da.   La constipacin puede tratarse con un laxante o agregando fibra a su dieta. Beber grandes cantidades de lquidos, comer vegetales, frutas y granos integrales es de gran ayuda.   Tambin es beneficioso practicar actividad fsica. Si ha sido una persona activa hasta el embarazo, podr continuar con la mayora de las actividades durante el mismo. Si ha sido menos activa, puede ser beneficioso que comience con un programa de ejercicios, como realizar caminatas.   Puede desarrollar hemorroides (vrices en el recto) hacia el final del segundo trimestre. Tomar baos de asiento tibios y utilizar cremas recomendadas por el profesional que lo asiste sern de ayuda para los problemas de hemorroides.   Tambin podr sentir dolor de espalda durante este momento de su embarazo. Evite levantar objetos pesados, utilice zapatos de taco bajo y mantenga una buena postura para ayudar a reducir los problemas de espalda.   Algunas mujeres embarazadas desarrollan hormigueo y adormecimiento de la mano y los dedos debido a la hinchazn y compresin de los ligamentos de la mueca (sndrome del tnel carpiano). Esto desaparece una vez que el beb nace.   Como sus pechos se  agrandan, necesitar un sujetador ms grande. Use un sostn de soporte, cmodo y de algodn. No utilice un sostn para amamantar hasta el ltimo mes de embarazo si va a amamantar al beb.   Podr observar una lnea oscura desde el ombligo hacia la zona pbica denominada linea nigra.   Podr observar que sus mejillas se ponen coloradas debido al aumento de flujo sanguneo en la cara.   Podr desarrollar "araitas" en la cara, cuello y pecho. Esto desaparece una vez que el beb nace.  INSTRUCCIONES PARA EL CUIDADO DOMICILIARIO  Es extremadamente importante que evite el cigarrillo, hierbas medicinales, alcohol y las drogas no prescriptas durante el embarazo. Estas sustancias qumicas afectan la formacin y el desarrollo del beb. Evite estas sustancias durante todo el embarazo para asegurar el nacimiento de un beb sano.   La mayor parte de los cuidados que se aconsejan son los mismos que los indicados para el primer trimestre del embarazo. Cumpla con las citas tal como se le indic. Siga las instrucciones del profesional que lo asiste con respecto al uso de los medicamentos, el ejercicio y la dieta.   Durante el embarazo debe obtener nutrientes para usted y para su beb. Consuma alimentos balanceados a intervalos regulares. Elija alimentos como carne, pescado, leche y otros productos lcteos descremados, vegetales, frutas, panes integrales y cereales. El profesional le informar cul es el   aumento de peso ideal.   Las relaciones sexuales fsicas pueden continuarse hasta cerca del fin del embarazo si no existen otros problemas. Estos problemas pueden ser la prdida temprana (prematura) de lquido amnitico de las membranas, sangrado vaginal, dolor abdominal u otros problemas mdicos o del embarazo.   Realice actividad fsica todos los das, si no tiene restricciones. Consulte con el profesional que la asiste si no sabe con certeza si determinados ejercicios son seguros. El mayor aumento de peso tiene  lugar durante los ltimos 2 trimestres del embarazo. El ejercicio la ayudar a:   Controlar su peso.   Ponerla en forma para el parto.   Ayudarla a perder peso luego de haber dado a luz.   Use un buen sostn o como los que se usan para hacer deportes para aliviar la sensibilidad de las mamas. Tambin puede serle til si lo usa mientras duerme. Si pierde calostro, podr utilizar apsitos en el sostn.   No utilice la baera con agua caliente, baos turcos y saunas durante el embarazo.   Utilice el cinturn de seguridad sin excepcin cuando conduzca. Este la proteger a usted y al beb en caso de accidente.   Evite comer carne cruda, queso crudo, y el contacto con los utensilios y desperdicios de los gatos. Estos elementos contienen grmenes que pueden causar defectos de nacimiento en el beb.   El segundo trimestre es un buen momento para visitar a su dentista y evaluar su salud dental si an no lo ha hecho. Es importante mantener los dientes limpios. Utilice un cepillo de dientes blando. Cepllese ms suavemente durante el embarazo.   Es ms fcil perder algo de orina durante el embarazo. Apretar y fortalecer los msculos de la pelvis la ayudar con este problema. Practique detener la miccin cuando est en el bao. Estos son los mismos msculos que necesita fortalecer. Son tambin los mismos msculos que utiliza cuando trata de evitar los gases. Puede practicar apretando estos msculos 10 veces, y repetir esto 3 veces por da aproximadamente. Una vez que conozca qu msculos debe apretar, no realice estos ejercicios durante la miccin. Puede favorecerle una infeccin si la orina vuelve hacia atrs.   Pida ayuda si tiene necesidades econmicas, de asesoramiento o nutricionales durante el embarazo. El profesional podr ayudarla con respecto a estas necesidades, o derivarla a otros especialistas.   La piel puede ponerse grasa. Si esto sucede, lvese la cara con un jabn suave, utilice un  humectante no graso y maquillaje con base de aceite o crema.  CONSUMO DE MEDICAMENTOS Y DROGAS DURANTE EL EMBARAZO  Contine tomando las vitaminas apropiadas para esta etapa tal como se le indic. Las vitaminas deben contener un miligramo de cido flico y deben suplementarse con hierro. Guarde todas las vitaminas fuera del alcance de los nios. La ingestin de slo un par de vitaminas o tabletas que contengan hierro puede ocasionar la muerte en un beb o en un nio pequeo.   Evite el uso de medicamentos, inclusive los de venta libre y hierbas que no hayan sido prescriptos o indicados por el profesional que la asiste. Algunos medicamentos pueden causar problemas fsicos al beb. Utilice los medicamentos de venta libre o de prescripcin para el dolor, el malestar o la fiebre, segn se lo indique el profesional que lo asiste. No utilice aspirina.   El consumo de alcohol est relacionado con ciertos defectos de nacimiento. Esto incluye el sndrome de alcoholismo fetal. Debe evitar el consumo de alcohol en cualquiera de sus formas. El cigarrillo   causa nacimientos prematuros y bebs de bajo peso. El uso de drogas recreativas est absolutamente prohibido. Son muy nocivas para el beb. Un beb que nace de una madre adicta, ser adicto al nacer. Ese beb tendr los mismos sntomas de abstinencia que un adulto.   Infrmele al profesional si consume alguna droga.   No consuma drogas ilegales. Pueden causarle mucho dao al beb.  SOLICITE ATENCIN MDICA SI: Tiene preguntas o preocupaciones durante su embarazo. Es mejor que llame para consultar las dudas que esperar hasta su prxima visita prenatal. De esta forma se sentir ms tranquila.  SOLICITE ATENCIN MDICA DE INMEDIATO SI:  La temperatura oral se eleva sin motivo por encima de 102 F (38.9 C) o segn le indique el profesional que lo asiste.   Tiene una prdida de lquido por la vagina (canal de parto). Si sospecha una ruptura de las membranas,  tmese la temperatura y llame al profesional para informarlo sobre esto.   Observa unas pequeas manchas, una hemorragia vaginal o elimina cogulos. Notifique al profesional acerca de la cantidad y de cuntos apsitos est utilizando. Unas pequeas manchas de sangre son algo comn durante el embarazo, especialmente despus de mantener relaciones sexuales.   Presenta un olor desagradable en la secrecin vaginal y observa un cambio en el color, de transparente a blanco.   Contina con las nuseas y no obtiene alivio de los remedios indicados. Vomita sangre o algo similar a la borra del caf.   [place] o sube ms de 900 g. en una semana, o segn lo indicado por el profesional que la asiste.   Observa que se le hinchan el rostro, las manos, los pies o las piernas.   Ha estado expuesta a la rubola y no ha sufrido la enfermedad.   Ha estado expuesta a la quinta enfermedad o a la varicela.   Presenta dolor abdominal. Las molestias en el ligamento redondo son una causa no cancerosa (benigna) frecuente de dolor abdominal durante el embarazo. El profesional que la asiste deber evaluarla.   Presenta dolor de cabeza intenso que no se alivia.   Presenta fiebre, diarrea, dolor al orinar o le falta la respiracin.   Presenta dificultad para ver, visin borrosa, o visin doble.   Sufre una cada, un accidente de trnsito o cualquier tipo de trauma.   Vive en un hogar en el que existe violencia fsica o mental.  Document Released: 04/24/2005 Document Revised: 03/27/2011 [employer] Patient Information 2012 [employer]. Amamantar al beb (Breastfeeding) LOS BENEFICIOS DE AMAMANTAR Para el beb  La primera leche (calostro ) ayuda al mejor funcionamiento del sistema digestivo del beb.   La leche tiene anticuerpos que provienen de la madre y que ayudan a prevenir las infecciones en el beb.   Hay una menor incidencia de asma, enfermedades alrgicas y SMSI (sndrome de muerte sbita nfantil).    Los nutrientes que contiene la leche materna son mejores que las frmulas para el bibern y favorecen el desarrollo cerebral.   Los bebs amamantados sufren menos gases, clicos y constipacin.  Para la mam  La lactancia materna favorece el desarrollo de un vnculo muy especial entre la madre y el beb.   Es ms conveniente, siempre disponible a la temperatura adecuada y ms econmica que la leche maternizada.   Consume caloras en la madre y la ayuda a perder el peso ganado durante el embarazo.   Favorece la contraccin del tero a su tamao normal, de manera ms rpida y disminuye las hemorragias luego del parto.     Las madres que amamantan tienen menor riesgo de desarrollar cncer de mama.  AMAMNTELO CON FRECUENCIA  Un beb sano, nacido a trmino, puede amamantarse con tanta frecuencia como cada hora, o espaciar las comidas cada tres horas.   Esta frecuencia variar de un beb a otro. Observe al beb cuando manifieste signos de hambre, antes que regirse por el reloj.   Amamntelo tan seguido como el beb lo solicite, o cuando usted sienta la necesidad de aliviar sus mamas.   Despierte al beb si han pasado 3  4 horas desde la ltima comida.   El amamantamiento frecuente la ayudar a producir ms leche y a prevenir problemas de dolor en los pezones e hinchazn de las mamas.  LA POSICIN DEL BEB PARA AMAMANTARLO  Ya sea que se encuentre acostada o sentada, asegrese que el abdomen del beb enfrente el suyo.   Sostenga la mama con el pulgar por arriba y el resto de los dedos por debajo. Asegrese que sus dedos se encuentren lejos del pezn y de la boca del beb.   Toque suavemente los labios del beb y la mejilla ms cercana a la mama con el dedo o el pezn.   Cuando la boca del beb se abra lo suficiente, introduzca el pezn y la zona oscura que lo rodea tanto como le sea posible dentro de la boca.   Coloque a beb cerca suyo de modo que su nariz y mejillas toquen las mamas  al mamar.  LAS COMIDAS  La duracin de cada comida vara de un beb a otro y de una comida a otra.   El beb debe succionar alrededor de dos o tres minutos para que le llegue leche. Esto se denomina "bajada". Por este motivo, permita que el nio se alimente en cada mama todo lo que desee. Terminar de mamar cuando haya recibido la cantidad adecuada de nutrientes.   Para detener la succin coloque su dedo en la comisura de la boca del nio y deslcelo entre sus encas antes de quitarle la mama de la boca. Esto la ayudar a evitar el dolor en los pezones.  REDUCIR LA CONGESTIN DE LAS MAMAS  Durante la primera semana despus del parto, usted puede experimentar congestin en las mamas. Cuando las mamas estn congestionadas, se sienten calientes, llenas y molestas al tacto. Puede reducir la congestin si:   Lo amamanta frecuentemente, cada 2-3 horas. Las mams que amamantan pronto y con frecuencia tienen menos problemas de congestin.   Coloque bolsas fras livianas entre cada mamada. Esto ayuda a reducir la hinchazn. Envuelva las bolsas de hielo en una toalla liviana para proteger su piel.   Aplique compresas hmedas calientes sobre la mama durante 5 a 10 minutos antes de amamantar al nio. Esto aumenta la circulacin y ayuda a que la leche fluya.   Masajee suavemente la mama antes y durante la alimentacin.   Asegrese que el nio vaca al menos una mama antes de cambiar de lado.   Use un sacaleche para vaciar la mama si el beb se duerme o no se alimenta bien. Tambin podr quitarse la leche con esta bomba si tiene que volver al trabajo o siente que las mamas estn congestionadas.   Evite los biberones, chupetes o complementar la alimentacin con agua o jugos en lugar de la leche materna.   Verifique que el beb se encuentra en la posicin correcta mientras lo alimenta.   Evite el cansancio, el estrs y la anemia   Use un soutien que sostenga   bien sus mamas y evite los que tienen aro.    Consuma una dieta balanceada y beba lquidos en cantidad.  Si sigue estas indicaciones, la congestin debe mejorar en 24 a 48 horas. Si an tiene dificultades, consulte a su asesor en lactancia. TENDR SUFICIENTE LECHE MI BEB? Algunas veces las madres se preocupan acerca de si sus bebs tendrn la leche suficiente. Puede asegurarse que el beb tiene la leche suficiente si:  El beb succiona y escucha que traga activamente.   El nio se alimenta al menos 8 a 12 veces en 24 horas. Alimntelo hasta que se desprenda por sus propios medios o se quede dormido en la primera mama (al menos durante 10 a 20 minutos), luego ofrzcale el otro lado.   El beb moja 5 a 6 paales descartables (6 a 8 paales de tela) en 24 horas cuando tiene 5  6 das de vida.   Tiene al menos 2-3 deposiciones todos los das en los primeros meses. La leche materna es todo el alimento que el beb necesita. No es necesario que el nio ingiera agua o preparados de bibern. De hecho, para ayudar a que sus mamas produzcan ms leche, lo mejor es no darle al beb suplementos durante las primeras semanas.   La materia fecal debe ser blanda y amarillenta.   El beb debe aumentar 112 a 196 g por semana.  CUDESE Cuide sus mamas del siguiente modo:  Bese o dchese diariamente.   No lave sus pezones con jabn.   Comience a amamantar del lado izquierdo en una comida y del lado derecho en la siguiente.   Notar que aumenta el suministro de leche a los 2 a 5 das despus del parto. Puede sentir algunas molestias por la congestin, lo que hace que sus mamas estn duras y sensibles. La congestin disminuye en 24 a 48 horas. Mientras tanto, aplique toallas hmedas calientes durante 5 a 10 minutos antes de amamantar. Un masaje suave y la extraccin de un poco de leche antes de amamantar ablandarn las mamas y har ms fcil que el beb se agarre. Use un buen sostn y seque al aire los pezones durante 10 a 15 minutos luego de cada  alimentacin.   Solo utilice apsitos de algodn.   Utilice lanolina pura sobre los pezones luego de amamantar. No necesita lavarlos luego de alimentar al nio.  Cudese del siguiente modo:   Consuma alimentos bien balanceados y refrigerios nutritivos.   Beba leche, jugos de fruta y agua para satisfacer la sed (alrededor de 8 vasos por da).   Descanse lo suficiente.   Aumente la ingesta de calcio en la dieta (1200mg/da).   Evite los alimentos que usted nota que puedan afectar al beb.  SOLICITE ATENCIN MDICA SI:  Tiene preguntas que formular o dificultades con la alimentacin a pecho.   Necesita ayuda.   Observa una zona dura, roja y que le duele en la zona de la mama, y se acompaa de fiebre de 100.5 F (38.1 C) o ms.   El beb est muy somnoliento como para alimentarse bien o tiene problemas para dormir.   El beb moja menos de 6 paales por da, a partir de los 5 das de vida.   La piel del beb o la parte blanca de sus ojos est ms amarilla de lo que estaba en el hospital.   Se siente deprimida.  Document Released: 07/15/2005 Document Revised: 03/27/2011 [employer] Patient Information 2012 [employer]. 

## 2011-09-05 NOTE — Progress Notes (Signed)
Pulse: 81

## 2011-09-05 NOTE — Progress Notes (Signed)
Nutrition Notes: (1st consult) Pt referred for 1st consult due to weight loss. Dx. HIV+, overweight, poor wt gain. Pt reports poor appetite, only eats 2 meals daily, snacks occasional. Current wt of 146# is 5# loss from reported pregravid wt of 151#. Disc wt gain goals of 15-25# total, plots around 8#< expected for weeks gestation. Pt agrees to increase dairy (currently on gets 3x/ week).  Overall intake of fruits and veggies and proteins is adequate.  Follow up in 4-6 weeks. Cy Blamer, RD

## 2011-09-05 NOTE — Progress Notes (Signed)
   Subjective:    Catherine Grimes is a G1P0 [redacted]w[redacted]d being seen today for her first obstetrical visit.  Her obstetrical history is significant for HIV. Patient does not intend to breast feed. Pregnancy history fully reviewed.  Patient reports no complaints.  Filed Vitals:   09/05/11 0858  BP: 95/53  Temp: 98 F (36.7 C)  Weight: 146 lb (66.225 kg)    HISTORY: OB History    Grav Para Term Preterm Abortions TAB SAB Ect Mult Living   1              # Outc Date GA Lbr Len/2nd Wgt Sex Del Anes PTL Lv   1 CUR              Past Medical History  Diagnosis Date  . HIV infection   . Infection     HIV   Past Surgical History  Procedure Date  . No past surgeries    Family History  Problem Relation Age of Onset  . Diabetes Mother      Exam    Uterine Size: size equals dates  Pelvic Exam:    Perineum: No Hemorrhoids   Vulva: normal   Vagina:  normal mucosa       Cervix: nulliparous appearance and small growth noted on cervix   Adnexa: normal adnexa   Bony Pelvis: average  System:     Skin: normal coloration and turgor, no rashes    Neurologic: oriented, normal, normal mood   Extremities: normal strength, tone, and muscle mass   HEENT sclera clear, anicteric   Mouth/Teeth mucous membranes moist, pharynx normal without lesions   Neck supple   Cardiovascular: regular rate and rhythm, no murmurs or gallops   Respiratory:  appears well, vitals normal, no respiratory distress, acyanotic, normal RR, ear and throat exam is normal, neck free of mass or lymphadenopathy, chest clear, no wheezing, crepitations, rhonchi, normal symmetric air entry   Abdomen: soft, non-tender; bowel sounds normal; no masses,  no organomegaly   Urinary: urethral meatus normal and bladder not palpable      Assessment:    Pregnancy: G1P0 Patient Active Problem List  Diagnoses  . HIV INFECTION  . Supervision of high-risk pregnancy  . ASCUS on Pap smear        Plan:     Initial labs  drawn. Prenatal vitamins. Problem list reviewed and updated. Genetic Screening discussed Quad Screen: undecided.  Ultrasound discussed; fetal survey: discussed.  Follow up in 4 weeks.   Kipp Shank S 09/05/2011

## 2011-09-09 ENCOUNTER — Telehealth: Payer: Self-pay | Admitting: *Deleted

## 2011-09-09 NOTE — Telephone Encounter (Addendum)
Need to call patient with interpreter- see below Message copied by Gerome Apley on Mon Sep 09, 2011  1:13 PM ------      Message from: Reva Bores      Created: Mon Sep 09, 2011  8:01 AM       nml pap, may inform pt.

## 2011-09-10 NOTE — Telephone Encounter (Signed)
Called pt with [facility] interpreter # [phone].  The call was answered by voice mail- no message left.

## 2011-09-11 NOTE — Telephone Encounter (Signed)
Called pt with Spanish interpreter and left message to return our call before noon and if she does not call back within the time frame Spanish interpreter will call before she leaves.

## 2011-09-11 NOTE — Telephone Encounter (Signed)
Called patient with Spanish interpreter, left message we are calling again with some information, please call clinic - it is not urgent

## 2011-09-12 NOTE — Telephone Encounter (Signed)
Per Dr. Shawnie Pons we do not need to inform pt about normal pap results due to cytology sending out normal pap result letters.

## 2011-10-03 ENCOUNTER — Encounter: Payer: Self-pay | Admitting: Physician Assistant

## 2011-10-09 ENCOUNTER — Other Ambulatory Visit (INDEPENDENT_AMBULATORY_CARE_PROVIDER_SITE_OTHER): Payer: Self-pay

## 2011-10-09 ENCOUNTER — Ambulatory Visit: Payer: Self-pay

## 2011-10-09 DIAGNOSIS — B20 Human immunodeficiency virus [HIV] disease: Secondary | ICD-10-CM

## 2011-10-09 DIAGNOSIS — Z113 Encounter for screening for infections with a predominantly sexual mode of transmission: Secondary | ICD-10-CM

## 2011-10-09 DIAGNOSIS — Z79899 Other long term (current) drug therapy: Secondary | ICD-10-CM

## 2011-10-10 ENCOUNTER — Ambulatory Visit (INDEPENDENT_AMBULATORY_CARE_PROVIDER_SITE_OTHER): Payer: Self-pay | Admitting: Family Medicine

## 2011-10-10 ENCOUNTER — Encounter: Payer: Self-pay | Admitting: Family Medicine

## 2011-10-10 VITALS — BP 105/62 | Temp 97.2°F | Wt 149.8 lb

## 2011-10-10 DIAGNOSIS — B20 Human immunodeficiency virus [HIV] disease: Secondary | ICD-10-CM

## 2011-10-10 DIAGNOSIS — O0990 Supervision of high risk pregnancy, unspecified, unspecified trimester: Secondary | ICD-10-CM

## 2011-10-10 DIAGNOSIS — Z21 Asymptomatic human immunodeficiency virus [HIV] infection status: Secondary | ICD-10-CM

## 2011-10-10 DIAGNOSIS — O98519 Other viral diseases complicating pregnancy, unspecified trimester: Secondary | ICD-10-CM

## 2011-10-10 DIAGNOSIS — O98719 Human immunodeficiency virus [HIV] disease complicating pregnancy, unspecified trimester: Secondary | ICD-10-CM

## 2011-10-10 LAB — POCT URINALYSIS DIP (DEVICE)
Hgb urine dipstick: NEGATIVE
Nitrite: NEGATIVE
Protein, ur: NEGATIVE mg/dL
Specific Gravity, Urine: 1.015 (ref 1.005–1.030)
Urobilinogen, UA: 0.2 mg/dL (ref 0.0–1.0)

## 2011-10-10 LAB — COMPLETE METABOLIC PANEL WITH GFR
ALT: 15 U/L (ref 0–35)
AST: 20 U/L (ref 0–37)
CO2: 23 mEq/L (ref 19–32)
Calcium: 8.8 mg/dL (ref 8.4–10.5)
Chloride: 105 mEq/L (ref 96–112)
Creat: 0.58 mg/dL (ref 0.50–1.10)
GFR, Est African American: >89 mL/min
Potassium: 4.1 mEq/L (ref 3.5–5.3)
Sodium: 136 mEq/L (ref 135–145)
Total Protein: 6.3 g/dL (ref 6.0–8.3)

## 2011-10-10 LAB — CBC
MCV: 89.6 fL (ref 78.0–100.0)
Platelets: 285 10*3/uL (ref 150–400)
RBC: 3.94 MIL/uL (ref 3.87–5.11)
RDW: 14.4 % (ref 11.5–15.5)
WBC: 7.3 10*3/uL (ref 4.0–10.5)

## 2011-10-10 LAB — LIPID PANEL
LDL Cholesterol: 36 mg/dL (ref 0–99)
Triglycerides: 190 mg/dL — ABNORMAL HIGH (ref <150)
VLDL: 38 mg/dL (ref 0–40)

## 2011-10-10 LAB — T-HELPER CELL (CD4) - (RCID CLINIC ONLY): CD4 % Helper T Cell: 41 % (ref 33–55)

## 2011-10-10 LAB — RPR

## 2011-10-10 NOTE — Progress Notes (Signed)
Patient without complaints or questions.  Denies vaginal bleeding, abnormal vaginal discharge, contractions, loss of fluid.  Reports good fetal activity.  Will schedule Follow up in 4 weeks.

## 2011-10-10 NOTE — Patient Instructions (Signed)
Embarazo - Segundo trimestre (Pregnancy - Second Trimester) El segundo trimestre del embarazo (del 3 al 6mes) es un perodo de evolucin rpida para usted y el beb. Hacia el final del sexto mes, el beb mide aproximadamente 23 cm y pesa 680 g. Comenzar a sentir los movimientos del beb entre las 18 y las 20 semanas de embarazo. Podr sentir las pataditas ("quickening en ingls"). Hay un rpido aumento de peso. Puede segregar un lquido claro (calostro) de las mamas. Quizs sienta pequeas contracciones en el vientre (tero) Esto se conoce como falso trabajo de parto o contracciones de Braxton-Hicks. Es como una prctica del trabajo de parto que se produce cuando el beb est listo para salir. Generalmente los problemas de vmitos matinales ya se han superado hacia el final del primer trimestre. Algunas mujeres desarrollan pequeas manchas oscuras (que se denominan cloasma, mscara del embarazo) en la cara que normalmente se van luego del nacimiento del beb. La exposicin al sol empeora las manchas. Puede desarrollarse acn en algunas mujeres embarazadas, y puede desaparecer en aquellas que ya tienen acn. EXAMENES PRENATALES  Durante los exmenes prenatales, deber seguir realizando pruebas de sangre, segn avance el embarazo. Estas pruebas se realizan para controlar su salud y la del beb. Tambin se realizan anlisis de sangre para conocer los niveles de hemoglobina. La anemia (bajo nivel de hemoglobina) es frecuente durante el embarazo. Para prevenirla, se administran hierro y vitaminas. Tambin se le realizarn exmenes para saber si tiene diabetes entre las 24 y las 28 semanas del embarazo. Podrn repetirle algunas de las pruebas que le hicieron previamente.   En cada visita le medirn el tamao del tero. Esto se realiza para asegurarse de que el beb est creciendo correctamente de acuerdo al estado del embarazo.   Tambin en cada visita prenatal controlarn su presin arterial. Esto se realiza  para asegurarse de que no tenga toxemia.   Se controlar su orina para asegurarse de que no tenga infecciones, diabetes o protena en la orina.   Se controlar su peso regularmente para asegurarse que el aumento ocurre al ritmo indicado. Esto se hace para asegurarse que usted y el beb tienen una evolucin normal.   En algunas ocasiones se realiza una prueba de ultrasonido para confirmar el correcto desarrollo y evolucin del beb. Esta prueba se realiza con ondas sonoras inofensivas para el beb, de modo que el profesional pueda calcular ms precisamente la fecha del parto.  Algunas veces se realizan pruebas especializadas del lquido amnitico que rodea al beb. Esta prueba se denomina amniocentesis. El lquido amnitico se obtiene introduciendo una aguja en el vientre (abdomen). Se realiza para controlar los cromosomas en aquellos casos en los que existe alguna preocupacin acerca de algn problema gentico que pueda sufrir el beb. En ocasiones se lleva a cabo cerca del final del embarazo, si es necesario inducir al parto. En este caso se realiza para asegurarse que los pulmones del beb estn lo suficientemente maduros como para que pueda vivir fuera del tero. CAMBIOS QUE OCURREN EN EL SEGUNDO TRIMESTRE DEL EMBARAZO Su organismo atravesar numerosos cambios durante el embarazo. Estos pueden variar de una persona a otra. Converse con el profesional que la asiste acerca los cambios que usted note y que la preocupen.  Durante el segundo trimestre probablemente sienta un aumento del apetito. Es normal tener "antojos" de ciertas comidas. Esto vara de una persona a otra y de un embarazo a otro.   El abdomen inferior comenzar a abultarse.   Podr tener la necesidad   de orinar con ms frecuencia debido a que el tero y el beb presionan sobre la vejiga. Tambin es frecuente contraer ms infecciones urinarias durante el embarazo (dolor al orinar). Puede evitarlas bebiendo gran cantidad de lquidos y  vaciando la vejiga antes y despus de mantener relaciones sexuales.   Podrn aparecer las primeras estras en las caderas, abdomen y mamas. Estos son cambios normales del cuerpo durante el embarazo. No existen medicamentos ni ejercicios que puedan prevenir estos cambios.   Es posible que comience a desarrollar venas inflamadas y abultadas (varices) en las piernas. El uso de medias de descanso, elevar sus pies durante 15 minutos, 3 a 4 veces al da y limitar la sal en su dieta ayuda a aliviar el problema.   Podr sentir acidez gstrica a medida que el tero crece y presiona contra el estmago. Puede tomar anticidos, con la autorizacin de su mdico, para aliviar este problema. Tambin es til ingerir pequeas comidas 4 a 5 veces al da.   La constipacin puede tratarse con un laxante o agregando fibra a su dieta. Beber grandes cantidades de lquidos, comer vegetales, frutas y granos integrales es de gran ayuda.   Tambin es beneficioso practicar actividad fsica. Si ha sido una persona activa hasta el embarazo, podr continuar con la mayora de las actividades durante el mismo. Si ha sido menos activa, puede ser beneficioso que comience con un programa de ejercicios, como realizar caminatas.   Puede desarrollar hemorroides (vrices en el recto) hacia el final del segundo trimestre. Tomar baos de asiento tibios y utilizar cremas recomendadas por el profesional que lo asiste sern de ayuda para los problemas de hemorroides.   Tambin podr sentir dolor de espalda durante este momento de su embarazo. Evite levantar objetos pesados, utilice zapatos de taco bajo y mantenga una buena postura para ayudar a reducir los problemas de espalda.   Algunas mujeres embarazadas desarrollan hormigueo y adormecimiento de la mano y los dedos debido a la hinchazn y compresin de los ligamentos de la mueca (sndrome del tnel carpiano). Esto desaparece una vez que el beb nace.   Como sus pechos se agrandan,  necesitar un sujetador ms grande. Use un sostn de soporte, cmodo y de algodn. No utilice un sostn para amamantar hasta el ltimo mes de embarazo si va a amamantar al beb.   Podr observar una lnea oscura desde el ombligo hacia la zona pbica denominada linea nigra.   Podr observar que sus mejillas se ponen coloradas debido al aumento de flujo sanguneo en la cara.   Podr desarrollar "araitas" en la cara, cuello y pecho. Esto desaparece una vez que el beb nace.  INSTRUCCIONES PARA EL CUIDADO DOMICILIARIO  Es extremadamente importante que evite el cigarrillo, hierbas medicinales, alcohol y las drogas no prescriptas durante el embarazo. Estas sustancias qumicas afectan la formacin y el desarrollo del beb. Evite estas sustancias durante todo el embarazo para asegurar el nacimiento de un beb sano.   La mayor parte de los cuidados que se aconsejan son los mismos que los indicados para el primer trimestre del embarazo. Cumpla con las citas tal como se le indic. Siga las instrucciones del profesional que lo asiste con respecto al uso de los medicamentos, el ejercicio y la dieta.   Durante el embarazo debe obtener nutrientes para usted y para su beb. Consuma alimentos balanceados a intervalos regulares. Elija alimentos como carne, pescado, leche y otros productos lcteos descremados, vegetales, frutas, panes integrales y cereales. El profesional le informar cul es el   aumento de peso ideal.   Las relaciones sexuales fsicas pueden continuarse hasta cerca del fin del embarazo si no existen otros problemas. Estos problemas pueden ser la prdida temprana (prematura) de lquido amnitico de las membranas, sangrado vaginal, dolor abdominal u otros problemas mdicos o del embarazo.   Realice actividad fsica todos los das, si no tiene restricciones. Consulte con el profesional que la asiste si no sabe con certeza si determinados ejercicios son seguros. El mayor aumento de peso tiene lugar  durante los ltimos 2 trimestres del embarazo. El ejercicio la ayudar a:   Controlar su peso.   Ponerla en forma para el parto.   Ayudarla a perder peso luego de haber dado a luz.   Use un buen sostn o como los que se usan para hacer deportes para aliviar la sensibilidad de las mamas. Tambin puede serle til si lo usa mientras duerme. Si pierde calostro, podr utilizar apsitos en el sostn.   No utilice la baera con agua caliente, baos turcos y saunas durante el embarazo.   Utilice el cinturn de seguridad sin excepcin cuando conduzca. Este la proteger a usted y al beb en caso de accidente.   Evite comer carne cruda, queso crudo, y el contacto con los utensilios y desperdicios de los gatos. Estos elementos contienen grmenes que pueden causar defectos de nacimiento en el beb.   El segundo trimestre es un buen momento para visitar a su dentista y evaluar su salud dental si an no lo ha hecho. Es importante mantener los dientes limpios. Utilice un cepillo de dientes blando. Cepllese ms suavemente durante el embarazo.   Es ms fcil perder algo de orina durante el embarazo. Apretar y fortalecer los msculos de la pelvis la ayudar con este problema. Practique detener la miccin cuando est en el bao. Estos son los mismos msculos que necesita fortalecer. Son tambin los mismos msculos que utiliza cuando trata de evitar los gases. Puede practicar apretando estos msculos 10 veces, y repetir esto 3 veces por da aproximadamente. Una vez que conozca qu msculos debe apretar, no realice estos ejercicios durante la miccin. Puede favorecerle una infeccin si la orina vuelve hacia atrs.   Pida ayuda si tiene necesidades econmicas, de asesoramiento o nutricionales durante el embarazo. El profesional podr ayudarla con respecto a estas necesidades, o derivarla a otros especialistas.   La piel puede ponerse grasa. Si esto sucede, lvese la cara con un jabn suave, utilice un humectante no  graso y maquillaje con base de aceite o crema.  CONSUMO DE MEDICAMENTOS Y DROGAS DURANTE EL EMBARAZO  Contine tomando las vitaminas apropiadas para esta etapa tal como se le indic. Las vitaminas deben contener un miligramo de cido flico y deben suplementarse con hierro. Guarde todas las vitaminas fuera del alcance de los nios. La ingestin de slo un par de vitaminas o tabletas que contengan hierro puede ocasionar la muerte en un beb o en un nio pequeo.   Evite el uso de medicamentos, inclusive los de venta libre y hierbas que no hayan sido prescriptos o indicados por el profesional que la asiste. Algunos medicamentos pueden causar problemas fsicos al beb. Utilice los medicamentos de venta libre o de prescripcin para el dolor, el malestar o la fiebre, segn se lo indique el profesional que lo asiste. No utilice aspirina.   El consumo de alcohol est relacionado con ciertos defectos de nacimiento. Esto incluye el sndrome de alcoholismo fetal. Debe evitar el consumo de alcohol en cualquiera de sus formas. El cigarrillo   causa nacimientos prematuros y bebs de bajo peso. El uso de drogas recreativas est absolutamente prohibido. Son muy nocivas para el beb. Un beb que nace de una madre adicta, ser adicto al nacer. Ese beb tendr los mismos sntomas de abstinencia que un adulto.   Infrmele al profesional si consume alguna droga.   No consuma drogas ilegales. Pueden causarle mucho dao al beb.  SOLICITE ATENCIN MDICA SI: Tiene preguntas o preocupaciones durante su embarazo. Es mejor que llame para consultar las dudas que esperar hasta su prxima visita prenatal. De esta forma se sentir ms tranquila.  SOLICITE ATENCIN MDICA DE INMEDIATO SI:  La temperatura oral se eleva sin motivo por encima de 102 F (38.9 C) o segn le indique el profesional que lo asiste.   Tiene una prdida de lquido por la vagina (canal de parto). Si sospecha una ruptura de las membranas, tmese la  temperatura y llame al profesional para informarlo sobre esto.   Observa unas pequeas manchas, una hemorragia vaginal o elimina cogulos. Notifique al profesional acerca de la cantidad y de cuntos apsitos est utilizando. Unas pequeas manchas de sangre son algo comn durante el embarazo, especialmente despus de mantener relaciones sexuales.   Presenta un olor desagradable en la secrecin vaginal y observa un cambio en el color, de transparente a blanco.   Contina con las nuseas y no obtiene alivio de los remedios indicados. Vomita sangre o algo similar a la borra del caf.   [place] o sube ms de 900 g. en una semana, o segn lo indicado por el profesional que la asiste.   Observa que se le hinchan el rostro, las manos, los pies o las piernas.   Ha estado expuesta a la rubola y no ha sufrido la enfermedad.   Ha estado expuesta a la quinta enfermedad o a la varicela.   Presenta dolor abdominal. Las molestias en el ligamento redondo son una causa no cancerosa (benigna) frecuente de dolor abdominal durante el embarazo. El profesional que la asiste deber evaluarla.   Presenta dolor de cabeza intenso que no se alivia.   Presenta fiebre, diarrea, dolor al orinar o le falta la respiracin.   Presenta dificultad para ver, visin borrosa, o visin doble.   Sufre una cada, un accidente de trnsito o cualquier tipo de trauma.   Vive en un hogar en el que existe violencia fsica o mental.  Document Released: 04/24/2005 Document Revised: 07/04/2011 [employer] Patient Information 2012 [employer]. 

## 2011-10-10 NOTE — Progress Notes (Signed)
U/S scheduled 10/16/11 at 1 pm.

## 2011-10-10 NOTE — Progress Notes (Signed)
Pulse: 78 Interpreter present for checkin 

## 2011-10-14 ENCOUNTER — Ambulatory Visit: Payer: Self-pay

## 2011-10-14 LAB — HIV-1 RNA QUANT-NO REFLEX-BLD
HIV 1 RNA Quant: <20 copies/mL (ref <20)
HIV-1 RNA Quant, Log: <1.3 {Log} (ref <1.30)

## 2011-10-16 ENCOUNTER — Ambulatory Visit (HOSPITAL_COMMUNITY)
Admission: RE | Admit: 2011-10-16 | Discharge: 2011-10-16 | Disposition: A | Discharge status: Home / Self Care | Payer: Self-pay | Source: Ambulatory Visit | Attending: Family Medicine | Admitting: Family Medicine

## 2011-10-16 DIAGNOSIS — O98519 Other viral diseases complicating pregnancy, unspecified trimester: Secondary | ICD-10-CM | POA: Insufficient documentation

## 2011-10-16 DIAGNOSIS — O358XX0 Maternal care for other (suspected) fetal abnormality and damage, not applicable or unspecified: Secondary | ICD-10-CM | POA: Insufficient documentation

## 2011-10-16 DIAGNOSIS — Z1389 Encounter for screening for other disorder: Secondary | ICD-10-CM | POA: Insufficient documentation

## 2011-10-16 DIAGNOSIS — Z363 Encounter for antenatal screening for malformations: Secondary | ICD-10-CM | POA: Insufficient documentation

## 2011-10-16 DIAGNOSIS — Z21 Asymptomatic human immunodeficiency virus [HIV] infection status: Secondary | ICD-10-CM | POA: Insufficient documentation

## 2011-10-16 DIAGNOSIS — O98719 Human immunodeficiency virus [HIV] disease complicating pregnancy, unspecified trimester: Secondary | ICD-10-CM

## 2011-10-17 ENCOUNTER — Encounter: Payer: Self-pay | Admitting: Family Medicine

## 2011-10-23 ENCOUNTER — Encounter: Payer: Self-pay | Admitting: Infectious Diseases

## 2011-10-23 ENCOUNTER — Ambulatory Visit: Payer: Self-pay | Admitting: Infectious Diseases

## 2011-10-23 ENCOUNTER — Ambulatory Visit (INDEPENDENT_AMBULATORY_CARE_PROVIDER_SITE_OTHER): Payer: Self-pay | Admitting: Infectious Diseases

## 2011-10-23 VITALS — BP 113/73 | HR 73 | Temp 98.8°F | Wt 150.0 lb

## 2011-10-23 DIAGNOSIS — K1379 Other lesions of oral mucosa: Secondary | ICD-10-CM | POA: Insufficient documentation

## 2011-10-23 DIAGNOSIS — R221 Localized swelling, mass and lump, neck: Secondary | ICD-10-CM

## 2011-10-23 DIAGNOSIS — B20 Human immunodeficiency virus [HIV] disease: Secondary | ICD-10-CM

## 2011-10-23 NOTE — Assessment & Plan Note (Signed)
Will have her seen by oral surgery for resectin, bx.

## 2011-10-23 NOTE — Assessment & Plan Note (Signed)
She is doing very well. Will continue her current meds (rilpivirine is class B). She is given condoms. Spoke with her at length about adherence, condoms, not breast feeding, risk of neonatal infection (very low given her undetectable virus). Will se her back in 6 weeks.

## 2011-10-23 NOTE — Progress Notes (Signed)
  Subjective:    Patient ID: Catherine Grimes, female    DOB: Sep 07, 1980, 31 y.o.   MRN: [id number]  HPI 30 yo F with hx of HIV+, prev on atripla, changed to complera.  Is 21 weeks and 3 days by u/s (10-16-11).   HIV 1 RNA Quant (copies/mL)  Date Value  10/09/2011 <20   07/02/2011 <20   03/05/2011 <20      CD4 T Cell Abs (cmm)  Date Value  10/09/2011 610   07/02/2011 590   03/05/2011 940     Has been doing well, no missed doses of ART. Has had bleeding in her mouth with eating and brushing her teeth. Has had for ~ 3 months.     Review of Systems  Constitutional: Negative for appetite change and unexpected weight change.  Cardiovascular: Negative for leg swelling.  Gastrointestinal: Negative for diarrhea and constipation.  Genitourinary: Negative for dysuria.       Objective:   Physical Exam  Constitutional: She appears well-developed and well-nourished.  HENT:  Head: Head is without abrasion.  Mouth/Throat: Oropharynx is clear and moist. No oropharyngeal exudate.    Eyes: EOM are normal. Pupils are equal, round, and reactive to light.  Neck: Neck supple.  Cardiovascular: Normal rate and regular rhythm.   Pulmonary/Chest: Effort normal and breath sounds normal.  Abdominal: Soft. Bowel sounds are normal. She exhibits no distension. There is no tenderness.  Musculoskeletal: She exhibits no edema.  Lymphadenopathy:    She has no cervical adenopathy.          Assessment & Plan:

## 2011-11-01 ENCOUNTER — Encounter: Payer: Self-pay | Admitting: *Deleted

## 2011-11-01 ENCOUNTER — Other Ambulatory Visit: Payer: Self-pay | Admitting: Infectious Diseases

## 2011-11-01 NOTE — Progress Notes (Signed)
Patient ID: Catherine Grimes, female   DOB: February 12, 1981, 31 y.o.   MRN: [id number]  Per Dr. Ninetta Lights request I have contacted Dr. Tressie Ellis office [phone]) for her referral for oral mass. I left message for him to review pt's case. MD will return on 4/15 and review. Dr. Ileene Hutchinson has Dr. Tyrone Apple # as well as my # to let us know if she is a candidate. Tacey Heap RN

## 2011-11-05 ENCOUNTER — Telehealth: Payer: Self-pay | Admitting: *Deleted

## 2011-11-05 NOTE — Telephone Encounter (Signed)
Interpretor, Raynelle Fanning called regarding patient's oral surgeon referral.  She is having issues with an oral mass and has been unable to to be referred due to being uninsured.  Dr. Jim Like with [facility] agreed to see her at his other office [facility] on 11/07/11 at 1:00 pm, Sharol Roussel with [facility] will provide transportation and the interpretor will meet them there. Wendall Mola CMA

## 2011-11-07 ENCOUNTER — Ambulatory Visit (INDEPENDENT_AMBULATORY_CARE_PROVIDER_SITE_OTHER): Payer: Self-pay | Admitting: Family Medicine

## 2011-11-07 VITALS — BP 102/68 | Temp 97.4°F | Wt 151.4 lb

## 2011-11-07 DIAGNOSIS — Z21 Asymptomatic human immunodeficiency virus [HIV] infection status: Secondary | ICD-10-CM

## 2011-11-07 DIAGNOSIS — O98519 Other viral diseases complicating pregnancy, unspecified trimester: Secondary | ICD-10-CM

## 2011-11-07 DIAGNOSIS — O98719 Human immunodeficiency virus [HIV] disease complicating pregnancy, unspecified trimester: Secondary | ICD-10-CM

## 2011-11-07 LAB — POCT URINALYSIS DIP (DEVICE)
Glucose, UA: NEGATIVE mg/dL
Hgb urine dipstick: NEGATIVE
Protein, ur: NEGATIVE mg/dL
Specific Gravity, Urine: 1.025 (ref 1.005–1.030)
Urobilinogen, UA: 0.2 mg/dL (ref 0.0–1.0)
pH: 5.5 (ref 5.0–8.0)

## 2011-11-07 NOTE — Progress Notes (Signed)
Pulse: 85

## 2011-11-07 NOTE — Patient Instructions (Signed)
Embarazo - Segundo trimestre (Pregnancy - Second Trimester) El segundo trimestre del embarazo (del 3 al 6mes) es un perodo de evolucin rpida para usted y el beb. Hacia el final del sexto mes, el beb mide aproximadamente 23 cm y pesa 680 g. Comenzar a sentir los movimientos del beb entre las 18 y las 20 semanas de embarazo. Podr sentir las pataditas ("quickening en ingls"). Hay un rpido aumento de peso. Puede segregar un lquido claro (calostro) de las mamas. Quizs sienta pequeas contracciones en el vientre (tero) Esto se conoce como falso trabajo de parto o contracciones de Braxton-Hicks. Es como una prctica del trabajo de parto que se produce cuando el beb est listo para salir. Generalmente los problemas de vmitos matinales ya se han superado hacia el final del primer trimestre. Algunas mujeres desarrollan pequeas manchas oscuras (que se denominan cloasma, mscara del embarazo) en la cara que normalmente se van luego del nacimiento del beb. La exposicin al sol empeora las manchas. Puede desarrollarse acn en algunas mujeres embarazadas, y puede desaparecer en aquellas que ya tienen acn. EXAMENES PRENATALES  Durante los exmenes prenatales, deber seguir realizando pruebas de sangre, segn avance el embarazo. Estas pruebas se realizan para controlar su salud y la del beb. Tambin se realizan anlisis de sangre para conocer los niveles de hemoglobina. La anemia (bajo nivel de hemoglobina) es frecuente durante el embarazo. Para prevenirla, se administran hierro y vitaminas. Tambin se le realizarn exmenes para saber si tiene diabetes entre las 24 y las 28 semanas del embarazo. Podrn repetirle algunas de las pruebas que le hicieron previamente.   En cada visita le medirn el tamao del tero. Esto se realiza para asegurarse de que el beb est creciendo correctamente de acuerdo al estado del embarazo.   Tambin en cada visita prenatal controlarn su presin arterial. Esto se  realiza para asegurarse de que no tenga toxemia.   Se controlar su orina para asegurarse de que no tenga infecciones, diabetes o protena en la orina.   Se controlar su peso regularmente para asegurarse que el aumento ocurre al ritmo indicado. Esto se hace para asegurarse que usted y el beb tienen una evolucin normal.   En algunas ocasiones se realiza una prueba de ultrasonido para confirmar el correcto desarrollo y evolucin del beb. Esta prueba se realiza con ondas sonoras inofensivas para el beb, de modo que el profesional pueda calcular ms precisamente la fecha del parto.  Algunas veces se realizan pruebas especializadas del lquido amnitico que rodea al beb. Esta prueba se denomina amniocentesis. El lquido amnitico se obtiene introduciendo una aguja en el vientre (abdomen). Se realiza para controlar los cromosomas en aquellos casos en los que existe alguna preocupacin acerca de algn problema gentico que pueda sufrir el beb. En ocasiones se lleva a cabo cerca del final del embarazo, si es necesario inducir al parto. En este caso se realiza para asegurarse que los pulmones del beb estn lo suficientemente maduros como para que pueda vivir fuera del tero. CAMBIOS QUE OCURREN EN EL SEGUNDO TRIMESTRE DEL EMBARAZO Su organismo atravesar numerosos cambios durante el embarazo. Estos pueden variar de una persona a otra. Converse con el profesional que la asiste acerca los cambios que usted note y que la preocupen.  Durante el segundo trimestre probablemente sienta un aumento del apetito. Es normal tener "antojos" de ciertas comidas. Esto vara de una persona a otra y de un embarazo a otro.   El abdomen inferior comenzar a abultarse.   Podr tener la necesidad   de orinar con ms frecuencia debido a que el tero y el beb presionan sobre la vejiga. Tambin es frecuente contraer ms infecciones urinarias durante el embarazo (dolor al orinar). Puede evitarlas bebiendo gran cantidad de lquidos y  vaciando la vejiga antes y despus de mantener relaciones sexuales.   Podrn aparecer las primeras estras en las caderas, abdomen y mamas. Estos son cambios normales del cuerpo durante el embarazo. No existen medicamentos ni ejercicios que puedan prevenir estos cambios.   Es posible que comience a desarrollar venas inflamadas y abultadas (varices) en las piernas. El uso de medias de descanso, elevar sus pies durante 15 minutos, 3 a 4 veces al da y limitar la sal en su dieta ayuda a aliviar el problema.   Podr sentir acidez gstrica a medida que el tero crece y presiona contra el estmago. Puede tomar anticidos, con la autorizacin de su mdico, para aliviar este problema. Tambin es til ingerir pequeas comidas 4 a 5 veces al da.   La constipacin puede tratarse con un laxante o agregando fibra a su dieta. Beber grandes cantidades de lquidos, comer vegetales, frutas y granos integrales es de gran ayuda.   Tambin es beneficioso practicar actividad fsica. Si ha sido una persona activa hasta el embarazo, podr continuar con la mayora de las actividades durante el mismo. Si ha sido menos activa, puede ser beneficioso que comience con un programa de ejercicios, como realizar caminatas.   Puede desarrollar hemorroides (vrices en el recto) hacia el final del segundo trimestre. Tomar baos de asiento tibios y utilizar cremas recomendadas por el profesional que lo asiste sern de ayuda para los problemas de hemorroides.   Tambin podr sentir dolor de espalda durante este momento de su embarazo. Evite levantar objetos pesados, utilice zapatos de taco bajo y mantenga una buena postura para ayudar a reducir los problemas de espalda.   Algunas mujeres embarazadas desarrollan hormigueo y adormecimiento de la mano y los dedos debido a la hinchazn y compresin de los ligamentos de la mueca (sndrome del tnel carpiano). Esto desaparece una vez que el beb nace.   Como sus pechos se agrandan,  necesitar un sujetador ms grande. Use un sostn de soporte, cmodo y de algodn. No utilice un sostn para amamantar hasta el ltimo mes de embarazo si va a amamantar al beb.   Podr observar una lnea oscura desde el ombligo hacia la zona pbica denominada linea nigra.   Podr observar que sus mejillas se ponen coloradas debido al aumento de flujo sanguneo en la cara.   Podr desarrollar "araitas" en la cara, cuello y pecho. Esto desaparece una vez que el beb nace.  INSTRUCCIONES PARA EL CUIDADO DOMICILIARIO  Es extremadamente importante que evite el cigarrillo, hierbas medicinales, alcohol y las drogas no prescriptas durante el embarazo. Estas sustancias qumicas afectan la formacin y el desarrollo del beb. Evite estas sustancias durante todo el embarazo para asegurar el nacimiento de un beb sano.   La mayor parte de los cuidados que se aconsejan son los mismos que los indicados para el primer trimestre del embarazo. Cumpla con las citas tal como se le indic. Siga las instrucciones del profesional que lo asiste con respecto al uso de los medicamentos, el ejercicio y la dieta.   Durante el embarazo debe obtener nutrientes para usted y para su beb. Consuma alimentos balanceados a intervalos regulares. Elija alimentos como carne, pescado, leche y otros productos lcteos descremados, vegetales, frutas, panes integrales y cereales. El profesional le informar cul es el   aumento de peso ideal.   Las relaciones sexuales fsicas pueden continuarse hasta cerca del fin del embarazo si no existen otros problemas. Estos problemas pueden ser la prdida temprana (prematura) de lquido amnitico de las membranas, sangrado vaginal, dolor abdominal u otros problemas mdicos o del embarazo.   Realice actividad fsica todos los das, si no tiene restricciones. Consulte con el profesional que la asiste si no sabe con certeza si determinados ejercicios son seguros. El mayor aumento de peso tiene  lugar durante los ltimos 2 trimestres del embarazo. El ejercicio la ayudar a:   Controlar su peso.   Ponerla en forma para el parto.   Ayudarla a perder peso luego de haber dado a luz.   Use un buen sostn o como los que se usan para hacer deportes para aliviar la sensibilidad de las mamas. Tambin puede serle til si lo usa mientras duerme. Si pierde calostro, podr utilizar apsitos en el sostn.   No utilice la baera con agua caliente, baos turcos y saunas durante el embarazo.   Utilice el cinturn de seguridad sin excepcin cuando conduzca. Este la proteger a usted y al beb en caso de accidente.   Evite comer carne cruda, queso crudo, y el contacto con los utensilios y desperdicios de los gatos. Estos elementos contienen grmenes que pueden causar defectos de nacimiento en el beb.   El segundo trimestre es un buen momento para visitar a su dentista y evaluar su salud dental si an no lo ha hecho. Es importante mantener los dientes limpios. Utilice un cepillo de dientes blando. Cepllese ms suavemente durante el embarazo.   Es ms fcil perder algo de orina durante el embarazo. Apretar y fortalecer los msculos de la pelvis la ayudar con este problema. Practique detener la miccin cuando est en el bao. Estos son los mismos msculos que necesita fortalecer. Son tambin los mismos msculos que utiliza cuando trata de evitar los gases. Puede practicar apretando estos msculos 10 veces, y repetir esto 3 veces por da aproximadamente. Una vez que conozca qu msculos debe apretar, no realice estos ejercicios durante la miccin. Puede favorecerle una infeccin si la orina vuelve hacia atrs.   Pida ayuda si tiene necesidades econmicas, de asesoramiento o nutricionales durante el embarazo. El profesional podr ayudarla con respecto a estas necesidades, o derivarla a otros especialistas.   La piel puede ponerse grasa. Si esto sucede, lvese la cara con un jabn suave, utilice un  humectante no graso y maquillaje con base de aceite o crema.  CONSUMO DE MEDICAMENTOS Y DROGAS DURANTE EL EMBARAZO  Contine tomando las vitaminas apropiadas para esta etapa tal como se le indic. Las vitaminas deben contener un miligramo de cido flico y deben suplementarse con hierro. Guarde todas las vitaminas fuera del alcance de los nios. La ingestin de slo un par de vitaminas o tabletas que contengan hierro puede ocasionar la muerte en un beb o en un nio pequeo.   Evite el uso de medicamentos, inclusive los de venta libre y hierbas que no hayan sido prescriptos o indicados por el profesional que la asiste. Algunos medicamentos pueden causar problemas fsicos al beb. Utilice los medicamentos de venta libre o de prescripcin para el dolor, el malestar o la fiebre, segn se lo indique el profesional que lo asiste. No utilice aspirina.   El consumo de alcohol est relacionado con ciertos defectos de nacimiento. Esto incluye el sndrome de alcoholismo fetal. Debe evitar el consumo de alcohol en cualquiera de sus formas. El cigarrillo   causa nacimientos prematuros y bebs de [place]. El uso de drogas recreativas est absolutamente prohibido. Son muy nocivas para el beb. Un beb que nace de [employer], ser adicto al nacer. Ese beb tendr los mismos sntomas de abstinencia que un adulto.   Infrmele al profesional si consume alguna droga.   No consuma drogas ilegales. Pueden causarle mucho dao al beb.  SOLICITE ATENCIN MDICA SI: Tiene preguntas o preocupaciones durante su embarazo. Es mejor que llame para Science writer las dudas que esperar hasta su prxima visita prenatal. Thressa Sheller forma se sentir ms tranquila.  SOLICITE ATENCIN MDICA DE INMEDIATO SI:  La temperatura oral se eleva sin motivo por encima de 102 F (38.9 C) o segn le indique el profesional que lo asiste.   Tiene una prdida de lquido por la vagina (canal de parto). Si sospecha una ruptura de las [place],  tmese la temperatura y llame al profesional para informarlo sobre esto.   Observa unas pequeas manchas, una hemorragia vaginal o elimina cogulos. Notifique al profesional acerca de la cantidad y de cuntos apsitos est utilizando. Unas pequeas manchas de sangre son algo comn durante el Psychiatrist, especialmente despus de Sales promotion account executive.   Presenta un olor desagradable en la secrecin vaginal y observa un cambio en el color, de transparente a blanco.   Contina con las nuseas y no obtiene alivio de los remedios indicados. Vomita sangre o algo similar a la borra del caf.   [place] o sube ms de 900 g. en una semana, o segn lo indicado por el profesional que la asiste.   Observa que se le [employer], las manos, los pies o las piernas.   Ha estado expuesta a la rubola y no ha sufrido la enfermedad.   Ha estado expuesta a la quinta enfermedad o a la varicela.   Presenta dolor abdominal. Las molestias en el ligamento redondo son Neomia Dear causa no cancerosa (benigna) frecuente de dolor abdominal durante el embarazo. El profesional que la asiste deber evaluarla.   Presenta dolor de cabeza intenso que no se [place].   Presenta fiebre, diarrea, dolor al orinar o le falta la respiracin.   Presenta dificultad para ver, visin borrosa, o visin doble.   Sufre una cada, un accidente de trnsito o cualquier tipo de trauma.   Vive en un hogar en el que existe violencia fsica o mental.  Document Released: 04/24/2005 Document Revised: 07/04/2011 [facility] Patient Information 2012 [place], [place]. Eleccin del mtodo anticonceptivo  (Contraception Choices) La anticoncepcin (control de la natalidad) es el uso de cualquier mtodo o dispositivo para Location manager. A continuacin se indican algunos de esos mtodos.  MTODOS HORMONALES   Implante anticonceptivo. Es un tubo plstico delgado que contiene la hormona progesterona. No contiene estrgenos. El mdico inserta el tubo  en la parte interna del brazo. El tubo puede Geneticist, molecular durante 3 aos. Despus de los 3 aos debe retirarse. El implante impide que los ovarios liberen vulos (ovulacin), espesa el moco cervical, lo que evita que los espermatozoides ingresen al tero y hace ms delgada la membrana que cubre el interior del tero.   Inyecciones de progesterona sola. Estas inyecciones se administran cada 3 meses para evitar el embarazo. La progesterona sinttica impide que los ovarios liberen vulos. Tambin hace que el moco cervical se espese y modifica el recubrimiento interno del tero. Esto hace ms difcil que los espermatozoides sobrevivan en el tero.   Pldoras anticonceptivas. Las pldoras anticonceptivas contienen estrgenos y  progesterona. Actan impidiendo que el vulo se forme en el ovario(ovulacin). Las pldoras anticonceptivas son recetadas por el mdico.Tambin se utilizan para tratar los perodos menstruales abundantes.   Minipldora. Este tipo de pldora anticonceptiva contiene slo hormona progesterona. Deben tomarse todos los [address]  [address] del mes y debe recetarlas el mdico.   Parches anticonceptivos. El parche contiene hormonas similares a las que contienen las pldoras anticonceptivas. Deben cambiarse una vez por semana y se utilizan bajo prescripcin mdica.   Anillo vaginal. Anillo vaginal contiene hormonas similares a las que contienen las pldoras anticonceptivas. Se deja colocado durante tres semanas, se lo retira durante 1 semana y luego se coloca uno nuevo. La paciente debe sentirse cmoda para insertar y retirar el anillo de la vagina.Es necesaria la receta del mdico.   Anticonceptivos de Associate Professor. Los anticonceptivos de emergencia son mtodos para evitar un embarazo despus de una relacin sexual sin proteccin. Esta pldora puede tomarse inmediatamente despus de Child psychotherapist sexuales o hasta 5 [place] de haber tenido sexo sin proteccin. Es ms efectiva si se toma poco tiempo  despus. Los anticonceptivos de emergencia estn disponibles sin prescripcin mdica. Consltelo con su farmacutico. No use los anticonceptivos de emergencia como nico mtodo anticonceptivo.  MTODOS DE BARRERA   Condn masculino. Es una vaina delgada (ltex o goma) que se [place] en el pene durante el acto sexual. Deri Fuelling con espermicida para aumentar la efectividad.   Condn femenino. Es una vaina blanda y floja que se adapta suavemente a la vagina antes de las relaciones sexuales.   Diafragma. Es una barrera de ltex redonda y Casimer Bilis que debe ser ajustada por un profesional. Se inserta en la vagina, junto con un gel espermicida. Debe insertarse antes de Management consultant. Debe dejar el diafragma colocado en la vagina durante 6 a 8 horas despus de la relacin sexual.   Capuchn cervical. Es una taza de ltex o plstico, redonda y [place] que cubre el cuello del tero y debe ser ajustada por un mdico. Puede dejarlo colocado en la vagina hasta 48 horas despus de las Clinical research associate.   Esponja. Es una pieza blanda y circular de espuma de poliuretano. Contiene un espermicida. Se inserta en la vagina despus de mojarla y antes de las [employer].   Espermicidas. Los espermicidas son qumicos que matan o bloquean el esperma y no lo dejan ingresar al cuello del tero y al tero. Vienen en forma de cremas, geles, supositorios, espuma o comprimidos. No es necesario tener Emergency planning/management officer. Se insertan en la vagina con un aplicador antes de Management consultant. El proceso debe repetirse cada vez que tiene relaciones sexuales.  ANTICONCEPTIVOS INTRAUTERINOS   Dispositivo intrauterino (DIU). Es un dispositivo en forma de T que se coloca en el tero durante el perodo menstrual, para Location manager. Hay dos tipos:   DIU de cobre. Este tipo de DIU est recubierto con un alambre de cobre y se inserta dentro del tero. El cobre hace que el tero y las trompas de [place]opio produzcan un  liquido que [employer] espermatozoides. Puede permanecer colocado durante 10 aos.   DIU hormonal. Este tipo de DIU contiene la hormona progestina (progesterona sinttica). La hormona espesa el moco cervical y evita que los espermatozoides ingresen al tero y tambin afina la membrana que cubre el tero para evitar la implantacin del vulo fertilizado. La hormona debilita o destruye los espermatozoides que ingresan al tero. Puede permanecer colocado durante 5 aos.  MTODOS ANTICONCEPTIVOS PERMANENTES   Ligadura de trompas en  la mujer. La ligadura de trompas en la mujer se realiza sellando, atando u obstruyendo quirrgicamente las trompas de [place] lo que impide que el vulo descienda hacia el tero.   Esterilizacin masculina. Se realiza atando los conductos por los que pasan los espermatozoides (vasectoma).Esto impide que el esperma ingrese a la vagina durante el acto sexual. Luego del procedimiento, el hombre puede eyacular lquido (semen).  MTODOS DE PLANIFICACIN NATURAL   Planificacin familiar natural.  Consiste en no tener relaciones sexuales o usar un mtodo de barrera (condn, [place], capuchn cervical) en los [employer] la mujer podra quedar [place].   Mtodo calendario.  Consiste en el seguimiento de la duracin de cada ciclo menstrual y la identificacin de los perodos frtiles.   Mtodo de Occupational hygienist.  Consiste en evitar las relaciones sexuales durante la ovulacin.   Mtodo sintotrmico. Paramedic las relaciones sexuales en la poca en la que se est ovulando, utilizando un termmetro y tendiendo en cuenta los sntomas de la ovulacin.   Mtodo post-ovulacin. Consiste en planificar las relaciones sexuales para despus de haber ovulado.  Independientemente del tipo o mtodo anticonceptivo que usted elija, es importante que use condones para protegerse contra las enfermedades de transmisin sexual (ETS). Hable con su mdico con respecto a qu mtodo  anticonceptivo es el ms apropiado para usted.  Document Released: 07/15/2005 Document Revised: 07/04/2011 [facility] Patient Information 2012 [place], [place].

## 2011-11-07 NOTE — Progress Notes (Signed)
Doing well--undetectable viral load.  Missed [place] screen.

## 2011-12-04 ENCOUNTER — Encounter: Payer: Self-pay | Admitting: Infectious Diseases

## 2011-12-04 ENCOUNTER — Ambulatory Visit (INDEPENDENT_AMBULATORY_CARE_PROVIDER_SITE_OTHER): Payer: Self-pay | Admitting: Infectious Diseases

## 2011-12-04 VITALS — BP 83/55 | HR 80 | Temp 98.6°F | Wt 154.0 lb

## 2011-12-04 DIAGNOSIS — B20 Human immunodeficiency virus [HIV] disease: Secondary | ICD-10-CM

## 2011-12-04 DIAGNOSIS — O98519 Other viral diseases complicating pregnancy, unspecified trimester: Secondary | ICD-10-CM

## 2011-12-04 DIAGNOSIS — I959 Hypotension, unspecified: Secondary | ICD-10-CM | POA: Insufficient documentation

## 2011-12-04 LAB — COMPREHENSIVE METABOLIC PANEL
Albumin: 3.5 g/dL (ref 3.5–5.2)
Alkaline Phosphatase: 62 U/L (ref 39–117)
BUN: 6 mg/dL (ref 6–23)
Calcium: 8.7 mg/dL (ref 8.4–10.5)
Creat: 0.53 mg/dL (ref 0.50–1.10)
Glucose, Bld: 80 mg/dL (ref 70–99)
Potassium: 4 mEq/L (ref 3.5–5.3)

## 2011-12-04 LAB — CBC
HCT: 36.3 % (ref 36.0–46.0)
Hemoglobin: 12.2 g/dL (ref 12.0–15.0)
MCHC: 33.6 g/dL (ref 30.0–36.0)
MCV: 89.4 fL (ref 78.0–100.0)

## 2011-12-04 NOTE — Progress Notes (Signed)
Addended by: Mariea Clonts D on: 12/04/2011 12:05 PM   Modules accepted: Orders

## 2011-12-04 NOTE — Progress Notes (Signed)
  Subjective:    Patient ID: Catherine Grimes, female    DOB: 08-12-1980, 31 y.o.   MRN: [id number]  HPI 31 yo F with hx of HIV+, prev on atripla, changed to complera.  Is 27 weeks today. No problems with medications, no forgotten. Has questions about her Bp- has felt well, no CP, no SOB, no headaches. Had 1 episode of light headedness/feeling like she would pass out on 5-4, resolved after 7 minutes and resumed her normal activities. No problems with walking, working.  HIV 1 RNA Quant (copies/mL)  Date Value  10/09/2011 <20   07/02/2011 <20   03/05/2011 <20      CD4 T Cell Abs (cmm)  Date Value  10/09/2011 610   07/02/2011 590   03/05/2011 940        Review of Systems  Respiratory: Negative for shortness of breath.   Gastrointestinal: Negative for diarrhea and constipation.  Genitourinary: Negative for dysuria.  Neurological: Negative for headaches.       Objective:   Physical Exam  Constitutional: She appears well-developed and well-nourished.  Eyes: EOM are normal. Pupils are equal, round, and reactive to light.  Neck: Neck supple.  Cardiovascular: Normal rate, regular rhythm and normal heart sounds.   Pulmonary/Chest: Effort normal and breath sounds normal.  Abdominal: Soft. Bowel sounds are normal. She exhibits no distension. There is no tenderness.  Lymphadenopathy:    She has no cervical adenopathy.          Assessment & Plan:

## 2011-12-04 NOTE — Assessment & Plan Note (Signed)
She is doing very well. She has some concerns about her BP, i discussed with her that lower BP is not unusual in pregnancy. Asked her to f/u with her OB as well. She is offered/refused condoms. She [place] about traveling by [place], i see no reason why she cannot. Will check her labs today. See her back in 6 weeks.

## 2011-12-04 NOTE — Assessment & Plan Note (Signed)
As above, will also recheck her HCT today

## 2011-12-05 ENCOUNTER — Ambulatory Visit (INDEPENDENT_AMBULATORY_CARE_PROVIDER_SITE_OTHER): Payer: Self-pay | Admitting: Physician Assistant

## 2011-12-05 DIAGNOSIS — Z21 Asymptomatic human immunodeficiency virus [HIV] infection status: Secondary | ICD-10-CM

## 2011-12-05 DIAGNOSIS — O98519 Other viral diseases complicating pregnancy, unspecified trimester: Secondary | ICD-10-CM

## 2011-12-05 LAB — HIV-1 RNA ULTRAQUANT REFLEX TO GENTYP+: HIV-1 RNA Quant, Log: <1.3 {Log} (ref <1.30)

## 2011-12-05 LAB — T-HELPER CELL (CD4) - (RCID CLINIC ONLY): CD4 % Helper T Cell: 38 % (ref 33–55)

## 2011-12-05 NOTE — Progress Notes (Signed)
Pulse- 87 Pt c/o dizziness

## 2011-12-05 NOTE — Progress Notes (Signed)
Nutrition Note:  Referral for poor wt gain Pt originally seen 09/05/11 for 1st visit.  Pt reported poor appetite and limited intake. Today pt wt is 155.3# (4.3# overall gain). Pt reports great appetite and intake of 3-4 meals daily. No nausea or vomiting, takes PNV. Assured pt that sometimes wt loss or inadequate gain happens.   Agrees to continue intake as reported, adding 2-3 small snacks if possible.  Follow up if referred.  Cy Blamer, RD

## 2011-12-05 NOTE — Progress Notes (Signed)
No Complaints. + FM size=dates. Nutrition today secondary to low weight gain. Denies SEs r/t HIV meds

## 2011-12-05 NOTE — Patient Instructions (Signed)
Embarazo - Tercer trimestre (Pregnancy - Third Trimester) El tercer trimestre del embarazo (los ltimos 3 meses) es el perodo de cambios ms rpidos que atraviesan usted y el beb. El aumento de peso es ms rpido. El beb alcanza un largo de aproximadamente 50 cm (20 pulgadas) y pesa entre 2,700 y 4,500 kg (6 a 10 libras). El beb gana ms tejido graso y ya est listo para la vida fuera del cuerpo de la madre. Mientras estn en el interior, los bebs tienen perodos de sueo y vigilia, succionan el pulgar y tienen hipo. Quizs sienta pequeas contracciones del tero. Este es el falso trabajo de parto. Tambin se las conoce como contracciones de Braxton-Hicks. Es como una prctica del parto. Los problemas ms habituales de esta etapa del embarazo incluyen mayor dificultad para respirar, hinchazn de las manos y los pies por retencin de lquidos y la necesidad de orinar con ms frecuencia debido a que el tero y el beb presionan sobre la vejiga.  EXAMENES PRENATALES  Durante los exmenes prenatales, deber seguir realizando pruebas de sangre, segn avance el embarazo. Estas pruebas se realizan para controlar su salud y la del beb. Tambin se realizan anlisis de sangre para conocer los niveles de hemoglobina. La anemia (bajo nivel de hemoglobina) es frecuente durante el embarazo. Para prevenirla, se administran hierro y vitaminas. Tambin le harn nuevas pruebas para descartar la diabetes. Podrn repetirle algunas de las pruebas que le hicieron previamente.   En cada visita le medirn el tamao del tero. Es para asegurarse de que el beb se desarrolla correctamente.   Tambin en cada visita la pesarn. Esto se realiza para asegurarse de que aumenta de peso al ritmo indicado y que usted y su beb evolucionan normalmente.   En algunas ocasiones se realiza una ecografa para confirmar el correcto desarrollo y evolucin del beb. Esta prueba se realiza con ondas sonoras inofensivas para el beb, de modo  que el profesional pueda calcular con ms precisin la fecha del parto.   Discuta las posibilidades de la anestesia si necesita cesrea.  Algunas veces se realizan pruebas especializadas del lquido amnitico que rodea al beb. Esta prueba se denomina amniocentesis. El lquido amnitico se obtiene introduciendo una aguja en el abdomen (vientre). En ocasiones se lleva a cabo cerca del final del embarazo, si es necesario adelantar el parto. En este caso se realiza para asegurarse de que los pulmones del beb estn lo suficientemente maduros como para que pueda vivir fuera del tero. CAMBIOS QUE OCURREN EN EL TERCER TRIMESTRE DEL EMBARAZO Su organismo atravesar diferentes cambios durante el embarazo que varan de una persona a otra. Converse con el profesional que la asiste acerca los cambios que usted note y que la preocupen.  Durante el ltimo trimestre probablemente sienta un aumento del apetito. Es normal tener "antojos" de ciertas comidas. Esto vara de una persona a otra y de un embarazo a otro.   Podrn aparecer las primeras estras en las caderas, abdomen y mamas. Estos son cambios normales del cuerpo durante el embarazo. No existen medicamentos ni ejercicios que puedan prevenir estos cambios.   El estreimiento puede tratarse con un laxante o agregando fibra a su dieta. Beber grandes cantidades de lquidos, tomar fibras en forma de verduras, frutas y granos integrales es de gran ayuda.   Tambin es beneficioso practicar actividad fsica. Si ha sido una persona activa hasta el embarazo, podr continuar con la mayora de las actividades durante el mismo. Si ha sido menos activa, puede ser beneficioso   que comience con un programa de ejercicios, como realizar caminatas. Consulte con el profesional que la asiste antes de comenzar un programa de ejercicios.   Evite el consumo de cigarrillos, el alcohol, los medicamentos no prescritos y las "drogas de la calle" durante el embarazo. Estas sustancias  qumicas afectan la formacin y el desarrollo del beb. Evite estas sustancias durante todo el embarazo para asegurar el nacimiento de un beb sano.   Dolor de espalda, venas varicosas y hemorroides podran aparecer o empeorar.   Los movimientos del beb pueden ser ms bruscos y aparecer ms a menudo.   Puede que note dificultades para respirar facilmente.   El ombligo podra salrsele hacia afuera.   Puede segregar un lquido amarillento (calostro) de las mamas.   Puede segregar mucus con sangre. Esto normalmente ocurre unos pocos das a una semana antes de que comience el trabajo de parto.  INSTRUCCIONES PARA EL CUIDADO DOMICILIARIO  La mayor parte de los cuidados que se aconsejan son los mismos que los indicados para las primeras etapas del embarazo. Es importante que concurra a todas las citas con el profesional y siga sus instrucciones con respecto a los medicamentos que deba utilizar, a la actividad fsica y a la dieta.   Durante el embarazo debe obtener nutrientes para usted y para su beb. Consuma alimentos balanceados a intervalos regulares. Elija alimentos como carne, pescado, leche y otros productos lcteos descremados, verduras, frutas, panes integrales y cereales. El profesional le informar cul es el aumento de peso ideal.   Las relaciones sexuales pueden continuarse hasta casi el final del embarazo, si no se presentan otros problemas como prdida prematura (antes de tiempo) de lquido amnitico, hemorragia vaginal o dolor abdominal (en el vientre).   Realice actividad fsica todos los das, si no tiene restricciones. Consulte con el profesional que la asiste si no sabe con certeza si determinados ejercicios son seguros. El mayor aumento de peso se produce en los dos ltimos trimestres del embarazo.   Haga reposo con frecuencia, con las piernas elevadas, o segn lo necesite para evitar los calambres y el dolor de cintura.   Use un buen sostn o como los que se usan para hacer  deportes para aliviar la sensibilidad de las mamas. Tambin puede serle til si lo usa mientras duerme. Si pierde calostro, podr utilizar apsitos en el sostn.   No utilice la baera con agua caliente, baos turcos y saunas.   Colquese el cinturn de seguridad cuando conduzca. Este la proteger a usted y al beb en caso de accidente.   Evite comer carne cruda y el contacto con los utensilios y desperdicios de los gatos. Estos elementos contienen grmenes que pueden causar defectos de nacimiento en el beb.   Es fcil perder algo de orina durante el embarazo. Apretar y fortalecer los msculos de la pelvis la ayudar con este problema. Practique detener la miccin cuando est en el bao. Estos son los mismos msculos que necesita fortalecer. Son tambin los mismos msculos que utiliza cuando trata de evitar los gases. Puede practicar apretando estos msculos diez veces, y repetir esto tres veces por da aproximadamente. Una vez que conozca qu msculos debe contraer, no realice estos ejercicios durante la miccin. Puede favorecerle una infeccin si la orina vuelve hacia atrs.   Pida ayuda si tiene necesidades econmicas, de asesoramiento o nutricionales durante el embarazo. El profesional podr ayudarla con respecto a estas necesidades, o derivarla a otros especialistas.   Practique la ida hasta el hospital a modo   de prueba.   Tome clases prenatales junto con su pareja para comprender, practicar y hacer preguntas acerca del trabajo de parto y el nacimiento.   Prepare la habitacin del beb.   No viaje fuera de la ciudad a menos que sea absolutamente necesario y con el consejo del mdico.   Use slo zapatos bajos sin taco para tener un mejor equilibrio y prevenir cadas.  EL CONSUMO DE MEDICAMENTOS Y DROGAS DURANTE EL EMBARAZO  Contine tomando las vitaminas apropiadas para esta etapa tal como se le indic. Las vitaminas deben contener un miligramo de cido flico y deben suplementarse con  hierro. Guarde todas las vitaminas fuera del alcance de los nios. La ingestin de slo un par de vitaminas o comprimidos que contengan hierro pueden ocasionar la muerte en un beb o en un nio pequeo.   Evite el uso de medicamentos, inclusive los de venta libre, que no hayan sido prescritos o indicados por el profesional que la asiste. Algunos medicamentos pueden causar problemas fsicos al beb. Utilice los medicamentos de venta libre o de prescripcin para el dolor, el malestar o la fiebre, segn se lo indique el profesional que lo asiste. No utilice aspirina, ibuprofeno (Motrin, Advil, Nuprin) o naproxeno (Aleve) a menos que el profesional la autorice.   El alcohol se asocia a cierto nmero de defectos del nacimiento, incluido el sndrome de alcoholismo fetal. Debe evitar el consumo de alcohol en cualquiera de sus formas. El cigarrillo causa nacimientos prematuros y bebs de bajo peso al nacer. Las drogas de la calle son muy nocivas para el beb y estn absolutamente prohibidas. Un beb que nace de una madre adicta, ser adicto al nacer. Ese beb tendr los mismos sntomas de abstinencia que un adulto.   Infrmele al profesional si consume alguna droga.  SOLICITE ATENCIN MDICA SI: Tiene alguna preocupacin durante el embarazo. Es mejor que llame para formular las preguntas si no puede esperar hasta la prxima visita, que sentirse preocupada por ellas.  DECISIONES ACERCA DE LA CIRCUNCISIN Usted puede saber o no cul es el sexo de su beb. Si es un varn, ste es el momento de pensar acerca de la circuncisin. La circuncisin es la extirpacin del prepucio. Esta es la piel que cubre el extremo sensible del pene. No hay un motivo mdico que lo justifique. Generalmente la decisin se toma segn lo que sea popular en ese momento, o se basa en creencias religiosas. Podr conversar estos temas con el profesional que la asiste. SOLICITE ATENCIN MDICA DE INMEDIATO SI:  La temperatura oral se eleva  sin motivo por encima de 102 F (38.9 C) o segn le indique el profesional que la asiste.   Tiene una prdida de lquido por la vagina (canal de parto). Si sospecha una ruptura de las membranas, tmese la temperatura y llame al profesional para informarlo sobre esto.   Observa unas pequeas manchas, una hemorragia vaginal o elimina cogulos. Avsele al profesional acerca de la cantidad y de cuntos apsitos est utilizando.   Presenta un olor desagradable en la secrecin vaginal y observa un cambio en el color, de transparente a blanco.   Ha vomitado durante ms de 24 horas.   Presenta escalofros o fiebre.   Comienza a sentir falta de aire.   Siente ardor al orinar.   [place]aja o sube ms de 900 g (ms de 2 libras), o segn lo indicado por el profesional que la asiste. Observa que sbitamente se le hinchan el rostro, las manos, los pies o las   piernas.   Presenta dolor abdominal. Las molestias en el ligamento redondo son una causa benigna (no cancerosa) frecuente de dolor abdominal durante el embarazo, pero el profesional que la asiste deber evaluarlo.   Presenta dolor de cabeza intenso que no se alivia.   Si no siente los movimientos del beb durante ms de tres horas. Si piensa que el beb no se mueve tanto como lo haca habitualmente, coma algo que contenga azcar y recustese sobre el lado izquierdo durante una hora. El beb debe moverse al menos 4  5 veces por hora. Comunquese inmediatamente si el beb se mueve menos que lo indicado.   Se cae, se ve involucrada en un accidente automovilstico o sufre algn tipo de traumatismo.   En su hogar hay violencia mental o fsica.  Document Released: 04/24/2005 Document Revised: 07/04/2011 ExitCare Patient Information 2012 [employer]. 

## 2011-12-06 LAB — GLUCOSE TOLERANCE, 1 HOUR: Glucose, 1 Hour GTT: 108 mg/dL (ref 70–140)

## 2011-12-26 ENCOUNTER — Ambulatory Visit (INDEPENDENT_AMBULATORY_CARE_PROVIDER_SITE_OTHER): Payer: Self-pay | Admitting: Family Medicine

## 2011-12-26 ENCOUNTER — Encounter: Payer: Self-pay | Admitting: Family Medicine

## 2011-12-26 VITALS — BP 108/70 | Temp 99.0°F | Wt 159.2 lb

## 2011-12-26 DIAGNOSIS — O98719 Human immunodeficiency virus [HIV] disease complicating pregnancy, unspecified trimester: Secondary | ICD-10-CM

## 2011-12-26 DIAGNOSIS — Z21 Asymptomatic human immunodeficiency virus [HIV] infection status: Secondary | ICD-10-CM

## 2011-12-26 DIAGNOSIS — O98519 Other viral diseases complicating pregnancy, unspecified trimester: Secondary | ICD-10-CM

## 2011-12-26 LAB — POCT URINALYSIS DIP (DEVICE)
Leukocytes, UA: NEGATIVE
Nitrite: NEGATIVE
Protein, ur: NEGATIVE mg/dL
pH: 7 (ref 5.0–8.0)

## 2011-12-26 NOTE — Progress Notes (Signed)
P-[phone] 

## 2011-12-26 NOTE — Progress Notes (Signed)
No complaints.  No contractions, decreased fetal activity, vaginal bleeding, vaginal discharge.  Normal GTT - 108

## 2011-12-26 NOTE — Progress Notes (Signed)
U/S January 09, 2012 at 345pm.

## 2011-12-26 NOTE — Patient Instructions (Signed)
Embarazo - Tercer trimestre (Pregnancy - Third Trimester) El tercer trimestre del embarazo (los ltimos 3 meses) es el perodo de cambios ms rpidos que atraviesan usted y el beb. El aumento de peso es ms rpido. El beb alcanza un largo de aproximadamente 50 cm (20 pulgadas) y pesa entre 2,700 y 4,500 kg (6 a 10 libras). El beb gana ms tejido graso y ya est listo para la vida fuera del cuerpo de la madre. Mientras estn en el interior, los bebs tienen perodos de sueo y vigilia, succionan el pulgar y tienen hipo. Quizs sienta pequeas contracciones del tero. Este es el falso trabajo de parto. Tambin se las conoce como contracciones de Braxton-Hicks. Es como una prctica del parto. Los problemas ms habituales de esta etapa del embarazo incluyen mayor dificultad para respirar, hinchazn de las manos y los pies por retencin de lquidos y la necesidad de orinar con ms frecuencia debido a que el tero y el beb presionan sobre la vejiga.  EXAMENES PRENATALES  Durante los exmenes prenatales, deber seguir realizando pruebas de sangre, segn avance el embarazo. Estas pruebas se realizan para controlar su salud y la del beb. Tambin se realizan anlisis de sangre para conocer los niveles de hemoglobina. La anemia (bajo nivel de hemoglobina) es frecuente durante el embarazo. Para prevenirla, se administran hierro y vitaminas. Tambin le harn nuevas pruebas para descartar la diabetes. Podrn repetirle algunas de las pruebas que le hicieron previamente.   En cada visita le medirn el tamao del tero. Es para asegurarse de que el beb se desarrolla correctamente.   Tambin en cada visita la pesarn. Esto se realiza para asegurarse de que aumenta de peso al ritmo indicado y que usted y su beb evolucionan normalmente.   En algunas ocasiones se realiza una ecografa para confirmar el correcto desarrollo y evolucin del beb. Esta prueba se realiza con ondas sonoras inofensivas para el beb, de modo  que el profesional pueda calcular con ms precisin la fecha del parto.   Discuta las posibilidades de la anestesia si necesita cesrea.  Algunas veces se realizan pruebas especializadas del lquido amnitico que rodea al beb. Esta prueba se denomina amniocentesis. El lquido amnitico se obtiene introduciendo una aguja en el abdomen (vientre). En ocasiones se lleva a cabo cerca del final del embarazo, si es necesario adelantar el parto. En este caso se realiza para asegurarse de que los pulmones del beb estn lo suficientemente maduros como para que pueda vivir fuera del tero. CAMBIOS QUE OCURREN EN EL TERCER TRIMESTRE DEL EMBARAZO Su organismo atravesar diferentes cambios durante el embarazo que varan de una persona a otra. Converse con el profesional que la asiste acerca los cambios que usted note y que la preocupen.  Durante el ltimo trimestre probablemente sienta un aumento del apetito. Es normal tener "antojos" de ciertas comidas. Esto vara de una persona a otra y de un embarazo a otro.   Podrn aparecer las primeras estras en las caderas, abdomen y mamas. Estos son cambios normales del cuerpo durante el embarazo. No existen medicamentos ni ejercicios que puedan prevenir estos cambios.   El estreimiento puede tratarse con un laxante o agregando fibra a su dieta. Beber grandes cantidades de lquidos, tomar fibras en forma de verduras, frutas y granos integrales es de gran ayuda.   Tambin es beneficioso practicar actividad fsica. Si ha sido una persona activa hasta el embarazo, podr continuar con la mayora de las actividades durante el mismo. Si ha sido menos activa, puede ser beneficioso   que comience con un programa de ejercicios, como realizar caminatas. Consulte con el profesional que la asiste antes de comenzar un programa de ejercicios.   Evite el consumo de cigarrillos, el alcohol, los medicamentos no prescritos y las "drogas de la calle" durante el embarazo. Estas sustancias  qumicas afectan la formacin y el desarrollo del beb. Evite estas sustancias durante todo el embarazo para asegurar el nacimiento de un beb sano.   Dolor de espalda, venas varicosas y hemorroides podran aparecer o empeorar.   Los movimientos del beb pueden ser ms bruscos y aparecer ms a menudo.   Puede que note dificultades para respirar facilmente.   El ombligo podra salrsele hacia afuera.   Puede segregar un lquido amarillento (calostro) de las mamas.   Puede segregar mucus con sangre. Esto normalmente ocurre unos pocos das a una semana antes de que comience el trabajo de parto.  INSTRUCCIONES PARA EL CUIDADO DOMICILIARIO  La mayor parte de los cuidados que se aconsejan son los mismos que los indicados para las primeras etapas del embarazo. Es importante que concurra a todas las citas con el profesional y siga sus instrucciones con respecto a los medicamentos que deba utilizar, a la actividad fsica y a la dieta.   Durante el embarazo debe obtener nutrientes para usted y para su beb. Consuma alimentos balanceados a intervalos regulares. Elija alimentos como carne, pescado, leche y otros productos lcteos descremados, verduras, frutas, panes integrales y cereales. El profesional le informar cul es el aumento de peso ideal.   Las relaciones sexuales pueden continuarse hasta casi el final del embarazo, si no se presentan otros problemas como prdida prematura (antes de tiempo) de lquido amnitico, hemorragia vaginal o dolor abdominal (en el vientre).   Realice actividad fsica todos los das, si no tiene restricciones. Consulte con el profesional que la asiste si no sabe con certeza si determinados ejercicios son seguros. El mayor aumento de peso se produce en los dos ltimos trimestres del embarazo.   Haga reposo con frecuencia, con las piernas elevadas, o segn lo necesite para evitar los calambres y el dolor de cintura.   Use un buen sostn o como los que se usan para hacer  deportes para aliviar la sensibilidad de las mamas. Tambin puede serle til si lo usa mientras duerme. Si pierde calostro, podr utilizar apsitos en el sostn.   No utilice la baera con agua caliente, baos turcos y saunas.   Colquese el cinturn de seguridad cuando conduzca. Este la proteger a usted y al beb en caso de accidente.   Evite comer carne cruda y el contacto con los utensilios y desperdicios de los gatos. Estos elementos contienen grmenes que pueden causar defectos de nacimiento en el beb.   Es fcil perder algo de orina durante el embarazo. Apretar y fortalecer los msculos de la pelvis la ayudar con este problema. Practique detener la miccin cuando est en el bao. Estos son los mismos msculos que necesita fortalecer. Son tambin los mismos msculos que utiliza cuando trata de evitar los gases. Puede practicar apretando estos msculos diez veces, y repetir esto tres veces por da aproximadamente. Una vez que conozca qu msculos debe contraer, no realice estos ejercicios durante la miccin. Puede favorecerle una infeccin si la orina vuelve hacia atrs.   Pida ayuda si tiene necesidades econmicas, de asesoramiento o nutricionales durante el embarazo. El profesional podr ayudarla con respecto a estas necesidades, o derivarla a otros especialistas.   Practique la ida hasta el hospital a modo   de prueba.   Tome clases prenatales junto con su pareja para comprender, practicar y hacer preguntas acerca del trabajo de parto y el nacimiento.   Prepare la habitacin del beb.   No viaje fuera de la ciudad a menos que sea absolutamente necesario y con el consejo del mdico.   Use slo zapatos bajos sin taco para tener un mejor equilibrio y prevenir cadas.  EL CONSUMO DE MEDICAMENTOS Y DROGAS DURANTE EL EMBARAZO  Contine tomando las vitaminas apropiadas para esta etapa tal como se le indic. Las vitaminas deben contener un miligramo de cido flico y deben suplementarse con  hierro. Guarde todas las vitaminas fuera del alcance de los nios. La ingestin de slo un par de vitaminas o comprimidos que contengan hierro pueden ocasionar la muerte en un beb o en un nio pequeo.   Evite el uso de medicamentos, inclusive los de venta libre, que no hayan sido prescritos o indicados por el profesional que la asiste. Algunos medicamentos pueden causar problemas fsicos al beb. Utilice los medicamentos de venta libre o de prescripcin para el dolor, el malestar o la fiebre, segn se lo indique el profesional que lo asiste. No utilice aspirina, ibuprofeno (Motrin, Advil, Nuprin) o naproxeno (Aleve) a menos que el profesional la autorice.   El alcohol se asocia a cierto nmero de defectos del nacimiento, incluido el sndrome de alcoholismo fetal. Debe evitar el consumo de alcohol en cualquiera de sus formas. El cigarrillo causa nacimientos prematuros y bebs de bajo peso al nacer. Las drogas de la calle son muy nocivas para el beb y estn absolutamente prohibidas. Un beb que nace de una madre adicta, ser adicto al nacer. Ese beb tendr los mismos sntomas de abstinencia que un adulto.   Infrmele al profesional si consume alguna droga.  SOLICITE ATENCIN MDICA SI: Tiene alguna preocupacin durante el embarazo. Es mejor que llame para formular las preguntas si no puede esperar hasta la prxima visita, que sentirse preocupada por ellas.  DECISIONES ACERCA DE LA CIRCUNCISIN Usted puede saber o no cul es el sexo de su beb. Si es un varn, ste es el momento de pensar acerca de la circuncisin. La circuncisin es la extirpacin del prepucio. Esta es la piel que cubre el extremo sensible del pene. No hay un motivo mdico que lo justifique. Generalmente la decisin se toma segn lo que sea popular en ese momento, o se basa en creencias religiosas. Podr conversar estos temas con el profesional que la asiste. SOLICITE ATENCIN MDICA DE INMEDIATO SI:  La temperatura oral se eleva  sin motivo por encima de 102 F (38.9 C) o segn le indique el profesional que la asiste.   Tiene una prdida de lquido por la vagina (canal de parto). Si sospecha una ruptura de las membranas, tmese la temperatura y llame al profesional para informarlo sobre esto.   Observa unas pequeas manchas, una hemorragia vaginal o elimina cogulos. Avsele al profesional acerca de la cantidad y de cuntos apsitos est utilizando.   Presenta un olor desagradable en la secrecin vaginal y observa un cambio en el color, de transparente a blanco.   Ha vomitado durante ms de 24 horas.   Presenta escalofros o fiebre.   Comienza a sentir falta de aire.   Siente ardor al orinar.   [place]aja o sube ms de 900 g (ms de 2 libras), o segn lo indicado por el profesional que la asiste. Observa que sbitamente se le hinchan el rostro, las manos, los pies o las   piernas.   Presenta dolor abdominal. Las molestias en el ligamento redondo son una causa benigna (no cancerosa) frecuente de dolor abdominal durante el embarazo, pero el profesional que la asiste deber evaluarlo.   Presenta dolor de cabeza intenso que no se alivia.   Si no siente los movimientos del beb durante ms de tres horas. Si piensa que el beb no se mueve tanto como lo haca habitualmente, coma algo que contenga azcar y recustese sobre el lado izquierdo durante una hora. El beb debe moverse al menos 4  5 veces por hora. Comunquese inmediatamente si el beb se mueve menos que lo indicado.   Se cae, se ve involucrada en un accidente automovilstico o sufre algn tipo de traumatismo.   En su hogar hay violencia mental o fsica.  Document Released: 04/24/2005 Document Revised: 07/04/2011 ExitCare Patient Information 2012 [employer]. 

## 2012-01-07 ENCOUNTER — Encounter: Payer: Self-pay | Admitting: Infectious Diseases

## 2012-01-07 ENCOUNTER — Ambulatory Visit (INDEPENDENT_AMBULATORY_CARE_PROVIDER_SITE_OTHER): Payer: Self-pay | Admitting: Infectious Diseases

## 2012-01-07 VITALS — BP 101/68 | HR 89 | Temp 98.1°F | Wt 163.8 lb

## 2012-01-07 DIAGNOSIS — B20 Human immunodeficiency virus [HIV] disease: Secondary | ICD-10-CM

## 2012-01-07 LAB — OB RESULTS CONSOLE HIV ANTIBODY (ROUTINE TESTING): HIV: REACTIVE

## 2012-01-07 NOTE — Assessment & Plan Note (Signed)
She is doing well. Will continue her complera. recheck her labs today. Offered condoms, got from [facility]. rtc 4-6 weeks.

## 2012-01-07 NOTE — Progress Notes (Signed)
  Subjective:    Patient ID: Catherine Grimes, female    DOB: 1981-04-08, 31 y.o.   MRN: [id number]  HPI 31 yo F who is  HIV+ and 32 1/[redacted] weeks pregnant. Feeling well. Denies missed ART. No problems with n/v.  HIV 1 RNA Quant (copies/mL)  Date Value  12/04/2011 <20   10/09/2011 <20   07/02/2011 <20      CD4 T Cell Abs (cmm)  Date Value  12/04/2011 610   10/09/2011 610   07/02/2011 590       Review of Systems  Gastrointestinal: Negative for diarrhea and constipation.  Genitourinary: Negative for dysuria.  Neurological: Negative for headaches.       Objective:   Physical Exam  Constitutional: She appears well-developed and well-nourished.  Eyes: EOM are normal. Pupils are equal, round, and reactive to light.  Cardiovascular: Normal rate, regular rhythm and normal heart sounds.   Pulmonary/Chest: Effort normal and breath sounds normal.  Abdominal: Soft. Bowel sounds are normal. She exhibits no distension. There is no tenderness.  Lymphadenopathy:    She has no cervical adenopathy.          Assessment & Plan:

## 2012-01-09 ENCOUNTER — Encounter: Payer: Self-pay | Admitting: Advanced Practice Midwife

## 2012-01-09 ENCOUNTER — Ambulatory Visit (INDEPENDENT_AMBULATORY_CARE_PROVIDER_SITE_OTHER): Payer: Self-pay | Admitting: Advanced Practice Midwife

## 2012-01-09 ENCOUNTER — Ambulatory Visit (HOSPITAL_COMMUNITY)
Admission: RE | Admit: 2012-01-09 | Discharge: 2012-01-09 | Disposition: A | Discharge status: Home / Self Care | Payer: Self-pay | Source: Ambulatory Visit | Attending: Family Medicine | Admitting: Family Medicine

## 2012-01-09 VITALS — BP 92/71 | Temp 99.2°F | Wt 161.5 lb

## 2012-01-09 DIAGNOSIS — O98719 Human immunodeficiency virus [HIV] disease complicating pregnancy, unspecified trimester: Secondary | ICD-10-CM

## 2012-01-09 DIAGNOSIS — Z3689 Encounter for other specified antenatal screening: Secondary | ICD-10-CM | POA: Insufficient documentation

## 2012-01-09 DIAGNOSIS — Z21 Asymptomatic human immunodeficiency virus [HIV] infection status: Secondary | ICD-10-CM

## 2012-01-09 DIAGNOSIS — O98519 Other viral diseases complicating pregnancy, unspecified trimester: Secondary | ICD-10-CM

## 2012-01-09 LAB — POCT URINALYSIS DIP (DEVICE)
Hgb urine dipstick: NEGATIVE
Ketones, ur: NEGATIVE mg/dL
Protein, ur: NEGATIVE mg/dL
Specific Gravity, Urine: 1.02 (ref 1.005–1.030)
pH: 6 (ref 5.0–8.0)

## 2012-01-09 NOTE — Patient Instructions (Signed)
Embarazo - Tercer trimestre (Pregnancy - Third Trimester) El tercer trimestre del embarazo (los ltimos 3 meses) es el perodo de cambios ms rpidos que atraviesan usted y el beb. El aumento de peso es ms rpido. El beb alcanza un largo de aproximadamente 50 cm (20 pulgadas) y pesa entre 2,700 y 4,500 kg (6 a 10 libras). El beb gana ms tejido graso y ya est listo para la vida fuera del cuerpo de la madre. Mientras estn en el interior, los bebs tienen perodos de sueo y vigilia, succionan el pulgar y tienen hipo. Quizs sienta pequeas contracciones del tero. Este es el falso trabajo de parto. Tambin se las conoce como contracciones de Braxton-Hicks. Es como una prctica del parto. Los problemas ms habituales de esta etapa del embarazo incluyen mayor dificultad para respirar, hinchazn de las manos y los pies por retencin de lquidos y la necesidad de orinar con ms frecuencia debido a que el tero y el beb presionan sobre la vejiga.  EXAMENES PRENATALES  Durante los exmenes prenatales, deber seguir realizando pruebas de sangre, segn avance el embarazo. Estas pruebas se realizan para controlar su salud y la del beb. Tambin se realizan anlisis de sangre para conocer los niveles de hemoglobina. La anemia (bajo nivel de hemoglobina) es frecuente durante el embarazo. Para prevenirla, se administran hierro y vitaminas. Tambin le harn nuevas pruebas para descartar la diabetes. Podrn repetirle algunas de las pruebas que le hicieron previamente.   En cada visita le medirn el tamao del tero. Es para asegurarse de que el beb se desarrolla correctamente.   Tambin en cada visita la pesarn. Esto se realiza para asegurarse de que aumenta de peso al ritmo indicado y que usted y su beb evolucionan normalmente.   En algunas ocasiones se realiza una ecografa para confirmar el correcto desarrollo y evolucin del beb. Esta prueba se realiza con ondas sonoras inofensivas para el beb, de modo  que el profesional pueda calcular con ms precisin la fecha del parto.   Discuta las posibilidades de la anestesia si necesita cesrea.  Algunas veces se realizan pruebas especializadas del lquido amnitico que rodea al beb. Esta prueba se denomina amniocentesis. El lquido amnitico se obtiene introduciendo una aguja en el abdomen (vientre). En ocasiones se lleva a cabo cerca del final del embarazo, si es necesario adelantar el parto. En este caso se realiza para asegurarse de que los pulmones del beb estn lo suficientemente maduros como para que pueda vivir fuera del tero. CAMBIOS QUE OCURREN EN EL TERCER TRIMESTRE DEL EMBARAZO Su organismo atravesar diferentes cambios durante el embarazo que varan de una persona a otra. Converse con el profesional que la asiste acerca los cambios que usted note y que la preocupen.  Durante el ltimo trimestre probablemente sienta un aumento del apetito. Es normal tener "antojos" de ciertas comidas. Esto vara de una persona a otra y de un embarazo a otro.   Podrn aparecer las primeras estras en las caderas, abdomen y mamas. Estos son cambios normales del cuerpo durante el embarazo. No existen medicamentos ni ejercicios que puedan prevenir estos cambios.   El estreimiento puede tratarse con un laxante o agregando fibra a su dieta. Beber grandes cantidades de lquidos, tomar fibras en forma de verduras, frutas y granos integrales es de gran ayuda.   Tambin es beneficioso practicar actividad fsica. Si ha sido una persona activa hasta el embarazo, podr continuar con la mayora de las actividades durante el mismo. Si ha sido menos activa, puede ser beneficioso   que comience con un programa de ejercicios, como realizar caminatas. Consulte con el profesional que la asiste antes de comenzar un programa de ejercicios.   Evite el consumo de cigarrillos, el alcohol, los medicamentos no prescritos y las "drogas de la calle" durante el embarazo. Estas sustancias  qumicas afectan la formacin y el desarrollo del beb. Evite estas sustancias durante todo el embarazo para asegurar el nacimiento de un beb sano.   Dolor de espalda, venas varicosas y hemorroides podran aparecer o empeorar.   Los movimientos del beb pueden ser ms bruscos y aparecer ms a menudo.   Puede que note dificultades para respirar facilmente.   El ombligo podra salrsele hacia afuera.   Puede segregar un lquido amarillento (calostro) de las mamas.   Puede segregar mucus con sangre. Esto normalmente ocurre unos pocos das a una semana antes de que comience el trabajo de parto.  INSTRUCCIONES PARA EL CUIDADO DOMICILIARIO  La mayor parte de los cuidados que se aconsejan son los mismos que los indicados para las primeras etapas del embarazo. Es importante que concurra a todas las citas con el profesional y siga sus instrucciones con respecto a los medicamentos que deba utilizar, a la actividad fsica y a la dieta.   Durante el embarazo debe obtener nutrientes para usted y para su beb. Consuma alimentos balanceados a intervalos regulares. Elija alimentos como carne, pescado, leche y otros productos lcteos descremados, verduras, frutas, panes integrales y cereales. El profesional le informar cul es el aumento de peso ideal.   Las relaciones sexuales pueden continuarse hasta casi el final del embarazo, si no se presentan otros problemas como prdida prematura (antes de tiempo) de lquido amnitico, hemorragia vaginal o dolor abdominal (en el vientre).   Realice actividad fsica todos los das, si no tiene restricciones. Consulte con el profesional que la asiste si no sabe con certeza si determinados ejercicios son seguros. El mayor aumento de peso se produce en los dos ltimos trimestres del embarazo.   Haga reposo con frecuencia, con las piernas elevadas, o segn lo necesite para evitar los calambres y el dolor de cintura.   Use un buen sostn o como los que se usan para hacer  deportes para aliviar la sensibilidad de las mamas. Tambin puede serle til si lo usa mientras duerme. Si pierde calostro, podr utilizar apsitos en el sostn.   No utilice la baera con agua caliente, baos turcos y saunas.   Colquese el cinturn de seguridad cuando conduzca. Este la proteger a usted y al beb en caso de accidente.   Evite comer carne cruda y el contacto con los utensilios y desperdicios de los gatos. Estos elementos contienen grmenes que pueden causar defectos de nacimiento en el beb.   Es fcil perder algo de orina durante el embarazo. Apretar y fortalecer los msculos de la pelvis la ayudar con este problema. Practique detener la miccin cuando est en el bao. Estos son los mismos msculos que necesita fortalecer. Son tambin los mismos msculos que utiliza cuando trata de evitar los gases. Puede practicar apretando estos msculos diez veces, y repetir esto tres veces por da aproximadamente. Una vez que conozca qu msculos debe contraer, no realice estos ejercicios durante la miccin. Puede favorecerle una infeccin si la orina vuelve hacia atrs.   Pida ayuda si tiene necesidades econmicas, de asesoramiento o nutricionales durante el embarazo. El profesional podr ayudarla con respecto a estas necesidades, o derivarla a otros especialistas.   Practique la ida hasta el hospital a modo   de prueba.   Tome clases prenatales junto con su pareja para comprender, practicar y hacer preguntas acerca del trabajo de parto y el nacimiento.   Prepare la habitacin del beb.   No viaje fuera de la ciudad a menos que sea absolutamente necesario y con el consejo del mdico.   Use slo zapatos bajos sin taco para tener un mejor equilibrio y prevenir cadas.  EL CONSUMO DE MEDICAMENTOS Y DROGAS DURANTE EL EMBARAZO  Contine tomando las vitaminas apropiadas para esta etapa tal como se le indic. Las vitaminas deben contener un miligramo de cido flico y deben suplementarse con  hierro. Guarde todas las vitaminas fuera del alcance de los nios. La ingestin de slo un par de vitaminas o comprimidos que contengan hierro pueden ocasionar la muerte en un beb o en un nio pequeo.   Evite el uso de medicamentos, inclusive los de venta libre, que no hayan sido prescritos o indicados por el profesional que la asiste. Algunos medicamentos pueden causar problemas fsicos al beb. Utilice los medicamentos de venta libre o de prescripcin para el dolor, el malestar o la fiebre, segn se lo indique el profesional que lo asiste. No utilice aspirina, ibuprofeno (Motrin, Advil, Nuprin) o naproxeno (Aleve) a menos que el profesional la autorice.   El alcohol se asocia a cierto nmero de defectos del nacimiento, incluido el sndrome de alcoholismo fetal. Debe evitar el consumo de alcohol en cualquiera de sus formas. El cigarrillo causa nacimientos prematuros y bebs de bajo peso al nacer. Las drogas de la calle son muy nocivas para el beb y estn absolutamente prohibidas. Un beb que nace de una madre adicta, ser adicto al nacer. Ese beb tendr los mismos sntomas de abstinencia que un adulto.   Infrmele al profesional si consume alguna droga.  SOLICITE ATENCIN MDICA SI: Tiene alguna preocupacin durante el embarazo. Es mejor que llame para formular las preguntas si no puede esperar hasta la prxima visita, que sentirse preocupada por ellas.  DECISIONES ACERCA DE LA CIRCUNCISIN Usted puede saber o no cul es el sexo de su beb. Si es un varn, ste es el momento de pensar acerca de la circuncisin. La circuncisin es la extirpacin del prepucio. Esta es la piel que cubre el extremo sensible del pene. No hay un motivo mdico que lo justifique. Generalmente la decisin se toma segn lo que sea popular en ese momento, o se basa en creencias religiosas. Podr conversar estos temas con el profesional que la asiste. SOLICITE ATENCIN MDICA DE INMEDIATO SI:  La temperatura oral se eleva  sin motivo por encima de 102 F (38.9 C) o segn le indique el profesional que la asiste.   Tiene una prdida de lquido por la vagina (canal de parto). Si sospecha una ruptura de las membranas, tmese la temperatura y llame al profesional para informarlo sobre esto.   Observa unas pequeas manchas, una hemorragia vaginal o elimina cogulos. Avsele al profesional acerca de la cantidad y de cuntos apsitos est utilizando.   Presenta un olor desagradable en la secrecin vaginal y observa un cambio en el color, de transparente a blanco.   Ha vomitado durante ms de 24 horas.   Presenta escalofros o fiebre.   Comienza a sentir falta de aire.   Siente ardor al orinar.   [place]aja o sube ms de 900 g (ms de 2 libras), o segn lo indicado por el profesional que la asiste. Observa que sbitamente se le hinchan el rostro, las manos, los pies o las   piernas.   Presenta dolor abdominal. Las molestias en el ligamento redondo son una causa benigna (no cancerosa) frecuente de dolor abdominal durante el embarazo, pero el profesional que la asiste deber evaluarlo.   Presenta dolor de cabeza intenso que no se alivia.   Si no siente los movimientos del beb durante ms de tres horas. Si piensa que el beb no se mueve tanto como lo haca habitualmente, coma algo que contenga azcar y recustese sobre el lado izquierdo durante una hora. El beb debe moverse al menos 4  5 veces por hora. Comunquese inmediatamente si el beb se mueve menos que lo indicado.   Se cae, se ve involucrada en un accidente automovilstico o sufre algn tipo de traumatismo.   En su hogar hay violencia mental o fsica.  Document Released: 04/24/2005 Document Revised: 07/04/2011 ExitCare Patient Information 2012 [employer]. 

## 2012-01-09 NOTE — Progress Notes (Signed)
Doing well. Dr Ninetta Lights saw her recently and was pleased. Good FM. No contractions, leaking or bleeding. Has [place] appt today at 2pm

## 2012-01-09 NOTE — Progress Notes (Signed)
Pulse 77 Patient reports pain in hips after being on feet all day  Catherine Grimes used for interpreter

## 2012-01-23 ENCOUNTER — Encounter: Payer: Self-pay | Admitting: Family

## 2012-02-06 ENCOUNTER — Ambulatory Visit (INDEPENDENT_AMBULATORY_CARE_PROVIDER_SITE_OTHER): Payer: Medicaid Other | Admitting: Obstetrics and Gynecology

## 2012-02-06 VITALS — BP 92/56 | Temp 98.8°F | Wt 163.0 lb

## 2012-02-06 DIAGNOSIS — O0990 Supervision of high risk pregnancy, unspecified, unspecified trimester: Secondary | ICD-10-CM

## 2012-02-06 DIAGNOSIS — O98719 Human immunodeficiency virus [HIV] disease complicating pregnancy, unspecified trimester: Secondary | ICD-10-CM

## 2012-02-06 DIAGNOSIS — O98519 Other viral diseases complicating pregnancy, unspecified trimester: Secondary | ICD-10-CM

## 2012-02-06 DIAGNOSIS — Z21 Asymptomatic human immunodeficiency virus [HIV] infection status: Secondary | ICD-10-CM

## 2012-02-06 DIAGNOSIS — B20 Human immunodeficiency virus [HIV] disease: Secondary | ICD-10-CM

## 2012-02-06 LAB — POCT URINALYSIS DIP (DEVICE)
Bilirubin Urine: NEGATIVE
Glucose, UA: NEGATIVE mg/dL
Hgb urine dipstick: NEGATIVE
Nitrite: NEGATIVE
Specific Gravity, Urine: 1.02 (ref 1.005–1.030)

## 2012-02-06 LAB — OB RESULTS CONSOLE GBS: GBS: NEGATIVE

## 2012-02-06 NOTE — Progress Notes (Signed)
Pulse: 69

## 2012-02-06 NOTE — Progress Notes (Signed)
Patient doing well without complaints. Cultures done today. Will schedule f/u ultrasound. Plan IOL at 39 weeks

## 2012-02-06 NOTE — Progress Notes (Signed)
U/S scheduled February 13, 2012 at 915 am.

## 2012-02-13 ENCOUNTER — Ambulatory Visit (INDEPENDENT_AMBULATORY_CARE_PROVIDER_SITE_OTHER): Payer: Medicaid Other | Admitting: Obstetrics & Gynecology

## 2012-02-13 ENCOUNTER — Ambulatory Visit (HOSPITAL_COMMUNITY)
Admission: RE | Admit: 2012-02-13 | Discharge: 2012-02-13 | Disposition: A | Discharge status: Home / Self Care | Payer: Medicaid Other | Source: Ambulatory Visit | Attending: Obstetrics and Gynecology | Admitting: Obstetrics and Gynecology

## 2012-02-13 VITALS — BP 104/72 | Temp 98.6°F | Wt 165.2 lb

## 2012-02-13 DIAGNOSIS — O98719 Human immunodeficiency virus [HIV] disease complicating pregnancy, unspecified trimester: Secondary | ICD-10-CM

## 2012-02-13 DIAGNOSIS — O98519 Other viral diseases complicating pregnancy, unspecified trimester: Secondary | ICD-10-CM | POA: Insufficient documentation

## 2012-02-13 DIAGNOSIS — O26899 Other specified pregnancy related conditions, unspecified trimester: Secondary | ICD-10-CM

## 2012-02-13 DIAGNOSIS — O9989 Other specified diseases and conditions complicating pregnancy, childbirth and the puerperium: Secondary | ICD-10-CM

## 2012-02-13 DIAGNOSIS — Z21 Asymptomatic human immunodeficiency virus [HIV] infection status: Secondary | ICD-10-CM

## 2012-02-13 DIAGNOSIS — B20 Human immunodeficiency virus [HIV] disease: Secondary | ICD-10-CM

## 2012-02-13 DIAGNOSIS — O0990 Supervision of high risk pregnancy, unspecified, unspecified trimester: Secondary | ICD-10-CM

## 2012-02-13 DIAGNOSIS — N898 Other specified noninflammatory disorders of vagina: Secondary | ICD-10-CM

## 2012-02-13 LAB — POCT URINALYSIS DIP (DEVICE)
Bilirubin Urine: NEGATIVE
Hgb urine dipstick: NEGATIVE
Ketones, ur: NEGATIVE mg/dL
pH: 6 (ref 5.0–8.0)

## 2012-02-13 NOTE — Progress Notes (Signed)
hiv viral load today.  Last levels are undetectable.  Induce at 39 weeks with AZT.   Negative pooling, negative Nitrazine, negative fern.  Wet prep sent.  Pt reassured.  Pt given info on birth control

## 2012-02-13 NOTE — Progress Notes (Signed)
Pulse: 91

## 2012-02-13 NOTE — Patient Instructions (Signed)
Eleccin del mtodo anticonceptivo  (Contraception Choices) El control de la natalidad (contracecin) impide que el [place]. Los diferentes tipos de anticonceptivos funcionan de [employer]. Algunos pueden:   Hacer que el moco del cuello del tero se espese. Esto les dificulta a los espermatozoides llegar al tero.   Hacer ms delgado el el revestimiento del tero. Esto dificulta que el vulo se adhiera a la pared del tero.   Impedir que los ovarios liberen un vulo.   Impedir que el esperma llegue al vulo.  Con ciertos tipos de [place] se puede impedir que el [place]. En las mujeres, [place] cierra las trompas de Falopio (ligadura de trompas). En los hombres, la ciruga impide que los espermatozoides se liberen durante el acto sexual (vasectoma).  ANTICONCEPTIVOS [employer]  Los anticonceptivos hormonales impiden el [place], agregando hormonas al [employer]. Los tipos de anticonceptivos son:  Un pequeo tubo colocado bajo la piel de la parte superior del brazo (implante). El tubo puede Geneticist, molecular durante 3 aos.   Vacunas administradas cada 3 meses.   Pastillas que se toman [employer] o una vez despus de Warehouse manager sexo (relaciones sexuales).   Parches que se cambian una vez por semana.   Un anillo que se coloca en la vagina (anillos vaginales). El anillo se deja en su lugar durante 3 semanas y se retira durante 1 semana Luego se coloca un nuevo anillo.  ANTICONCEPTIVOS DE Lenis Noon  Los anticonceptivos de barrera impiden que los espermatozoides lleguen al vulo. Estos tipos de anticonceptivos son:   Darene Lamer delgada que se [place] sobe el pene (condn masculino) que se Diplomatic Services operational officer las relaciones sexuales.   Una cubierta blanda y suelta que se coloca en la vagina (condn femenino) antes de las relaciones sexuales.   Un cuenco de goma que se aplica sobre el cuello del tero (diafragma). Este cuenco debe hacerse para usted. Se coloca en la  vagina antes de Management consultant. Debe dejar el diafragma colocado en la vagina durante 6 a 8 horas despus de las [employer].   Un capuchn pequeo y [employer] se fijo sobre el cuello del tero (capuchn cervical). Este capuchn debe hacerse para usted. Debe dejarlo colocado en la vagina durante 48 horas despus de las [employer].   Una esponja que se coloca en la vagina antes de Management consultant.   Una sustancia qumica que destruye o impide que los espermatozoides ingresen al cuello y al tero (espermicida). La sustancia qumica puede ser en crema, gel, espuma o pldoras.  DISPOSITIVO DE CONTROL INTRAUTERINO (DIU)  El DIU es un pequeo dispositivo plstico en forma de T. Se coloca dentro del tero. Hay dos tipos de DIU:   DIU de cobre El dispositivo est cubierto en alambre de cobre. El cobre produce un lquido que [employer] espermatozoides. Puede permanecer colocado durante 10 aos.   DIU hormonal La hormona impide que Insurance underwriter. Puede permanecer colocado durante 5 aos.  CONTROL DE LA NATALIDAD POR PLANIFICACIN FAMILIAR NATURAL  La planificacin familiar natural significa no tener Clinical research associate o usar un mtodo anticonceptivo de Warehouse manager en los perodos frtiles de la [place]. Janne Napoleon puede:   Usar un calendario para saber cul es su perodo frtil.   Emplear un termmetro para Building services engineer.  Protjase de las enfermedades de transmisin sexual cualquiera sea el mtodo que [place]. Hable con su mdico acerca de cul es el mejor mtodo anticonceptivo para usted.  Document Released:  10/30/2010 Document Revised: 07/04/2011 ExitCare Patient Information 2012 [place], [place].

## 2012-02-13 NOTE — Progress Notes (Signed)
Induction scheduled July 28th @ 0700.

## 2012-02-14 LAB — WET PREP, GENITAL
Trich, Wet Prep: NONE SEEN
Yeast Wet Prep HPF POC: NONE SEEN

## 2012-02-14 MED ORDER — METRONIDAZOLE 500 MG PO TABS: 500.0000 mg | ORAL_TABLET | Freq: Two times a day (BID) | ORAL | Status: AC

## 2012-02-14 NOTE — Progress Notes (Signed)
Called pt with [employer] # [phone] and informed pt of results and that she has an antibiotic e-prescribed to her pharmacy.  Verified pharmacy with pt and pt stated understanding and had no further questions.

## 2012-02-14 NOTE — Addendum Note (Signed)
Addended by: Lesly Dukes on: 02/14/2012 09:18 AM   Modules accepted: Orders

## 2012-02-14 NOTE — Progress Notes (Signed)
BV on wet prep  Rx with Flagyl.  RN to call pt to notify that Rx is at pharmacy

## 2012-02-17 ENCOUNTER — Telehealth (HOSPITAL_COMMUNITY): Payer: Self-pay | Admitting: *Deleted

## 2012-02-17 ENCOUNTER — Encounter (HOSPITAL_COMMUNITY): Payer: Self-pay | Admitting: *Deleted

## 2012-02-17 NOTE — Telephone Encounter (Signed)
Preadmission screen [phone]

## 2012-02-20 ENCOUNTER — Ambulatory Visit (INDEPENDENT_AMBULATORY_CARE_PROVIDER_SITE_OTHER): Payer: Medicaid Other | Admitting: Obstetrics & Gynecology

## 2012-02-20 ENCOUNTER — Telehealth: Payer: Self-pay | Admitting: *Deleted

## 2012-02-20 VITALS — BP 117/70 | Temp 99.2°F | Wt 165.2 lb

## 2012-02-20 DIAGNOSIS — O98519 Other viral diseases complicating pregnancy, unspecified trimester: Secondary | ICD-10-CM

## 2012-02-20 DIAGNOSIS — B20 Human immunodeficiency virus [HIV] disease: Secondary | ICD-10-CM

## 2012-02-20 DIAGNOSIS — Z23 Encounter for immunization: Secondary | ICD-10-CM

## 2012-02-20 LAB — POCT URINALYSIS DIP (DEVICE)
Protein, ur: NEGATIVE mg/dL
Specific Gravity, Urine: 1.025 (ref 1.005–1.030)
Urobilinogen, UA: 0.2 mg/dL (ref 0.0–1.0)
pH: 6 (ref 5.0–8.0)

## 2012-02-20 MED ORDER — TETANUS-DIPHTH-ACELL PERTUSSIS 5-2.5-18.5 LF-MCG/0.5 IM SUSP
0.5000 mL | Freq: Once | INTRAMUSCULAR | Status: AC
  Administered 2012-02-20: 0.5 mL via INTRAMUSCULAR

## 2012-02-20 NOTE — Addendum Note (Signed)
Addended by: Toula Moos on: 02/20/2012 12:07 PM   Modules accepted: Orders

## 2012-02-20 NOTE — Telephone Encounter (Signed)
Called patient with interpreter Anggie- and notified needs blood test drawn- states she is coming today at 10;30 for appt- informed her we will draw today

## 2012-02-20 NOTE — Progress Notes (Signed)
Pt needs viral load today--stat. Pt has appointment for induction at 39 weeks due to HIV.  Tdap today.

## 2012-02-20 NOTE — Progress Notes (Signed)
   Catherine Dukes, MD More Detail >>      Catherine Dukes, MD        Sent: Thu February 20, 2012  8:51 AM    To: [facility]        Glynda Jaeger    MRN: [id number] DOB: [birth year]     Pt Home: [phone]               Message     This patient needs her HIV RNA drawn ASAP.  She has induction the 28th.  Please confirm via in basket who is addressing this.         Many thanks     P= 84, C/o numbness in right wrist at times, c/o pain in hips, Used Engineer, maintenance (IT)

## 2012-02-20 NOTE — Telephone Encounter (Signed)
Message copied by Gerome Apley on Thu Feb 20, 2012  9:12 AM ------      Message from: Lesly Dukes      Created: Thu Feb 20, 2012  8:51 AM       This patient needs her HIV RNA drawn ASAP.  She has induction the 28th.  Please confirm via in basket who is addressing this.            Many thanks

## 2012-02-21 ENCOUNTER — Encounter: Payer: Self-pay | Admitting: Infectious Diseases

## 2012-02-21 ENCOUNTER — Ambulatory Visit (INDEPENDENT_AMBULATORY_CARE_PROVIDER_SITE_OTHER): Payer: Self-pay | Admitting: Infectious Diseases

## 2012-02-21 VITALS — BP 103/59 | HR 66 | Temp 98.5°F | Wt 166.8 lb

## 2012-02-21 DIAGNOSIS — B20 Human immunodeficiency virus [HIV] disease: Secondary | ICD-10-CM

## 2012-02-21 NOTE — Progress Notes (Signed)
  Subjective:    Patient ID: Catherine Grimes, female    DOB: [birth year], 31 y.o.   MRN: [id number]  HPI 31 yo F who is HIV+,  37 and 4/7 pregnant, scheduled for induction next week. Has been taking complera without problems. No problems eating, no problems with urination/BM. Feeling baby moving well. Rarely has LE edema (only with being on feet for prolonged period) HIV 1 RNA Quant (copies/mL)  Date Value  01/07/2012 <20   12/04/2011 <20   10/09/2011 <20      CD4 T Cell Abs (cmm)  Date Value  01/07/2012 680   12/04/2011 610   10/09/2011 610       Review of Systems     Objective:   Physical Exam  Constitutional: She appears well-developed and well-nourished.  HENT:  Mouth/Throat: No oropharyngeal exudate.  Eyes: EOM are normal. Pupils are equal, round, and reactive to light.  Neck: Neck supple.  Cardiovascular: Normal rate, regular rhythm and normal heart sounds.   Pulmonary/Chest: Effort normal and breath sounds normal.  Abdominal: Soft. Bowel sounds are normal. She exhibits no distension. There is no tenderness.  Musculoskeletal: She exhibits no edema.  Lymphadenopathy:    She has no cervical adenopathy.          Assessment & Plan:

## 2012-02-21 NOTE — Assessment & Plan Note (Addendum)
She is doing well. Will be induced on 7-28. Will get AZT IV during labor. Spoke with her about birth control after baby (wants to cont to use condoms). Spoke to her about no breast feeding (not clear if this really applies with VL <20). She had her labs done at [facility] yesterday, they don't appear in our system. I re-assured her about being induced.  Will see her back in 8 weeks with labs 2 weeks prior. She is to continue complera.

## 2012-02-23 ENCOUNTER — Inpatient Hospital Stay (HOSPITAL_COMMUNITY)
Admission: RE | Admit: 2012-02-23 | Discharge: 2012-02-25 | DRG: 774 | Disposition: A | Discharge status: Home / Self Care | Payer: Medicaid Other | Source: Ambulatory Visit | Attending: Obstetrics and Gynecology | Admitting: Obstetrics and Gynecology

## 2012-02-23 ENCOUNTER — Encounter (HOSPITAL_COMMUNITY): Payer: Self-pay

## 2012-02-23 DIAGNOSIS — O98519 Other viral diseases complicating pregnancy, unspecified trimester: Principal | ICD-10-CM | POA: Diagnosis present

## 2012-02-23 DIAGNOSIS — Z21 Asymptomatic human immunodeficiency virus [HIV] infection status: Secondary | ICD-10-CM | POA: Diagnosis present

## 2012-02-23 LAB — ABO/RH: ABO/RH(D): O POS

## 2012-02-23 LAB — CBC
HCT: 39.3 % (ref 36.0–46.0)
Hemoglobin: 13.4 g/dL (ref 12.0–15.0)
MCH: 30 pg (ref 26.0–34.0)
MCHC: 34.1 g/dL (ref 30.0–36.0)

## 2012-02-23 LAB — TYPE AND SCREEN: ABO/RH(D): O POS

## 2012-02-23 MED ORDER — ONDANSETRON HCL 4 MG/2ML IJ SOLN: 4.0000 mg | Freq: Four times a day (QID) | INTRAMUSCULAR | Status: DC | PRN

## 2012-02-23 MED ORDER — OXYTOCIN BOLUS FROM INFUSION
250.0000 mL | Freq: Once | INTRAVENOUS | Status: AC
  Administered 2012-02-23: 250 mL via INTRAVENOUS
  Filled 2012-02-23: qty 500

## 2012-02-23 MED ORDER — ZIDOVUDINE 10 MG/ML IV SOLN: 1.0000 mg/kg/h | INTRAVENOUS | Status: DC

## 2012-02-23 MED ORDER — OXYTOCIN 40 UNITS IN LACTATED RINGERS INFUSION - SIMPLE MED
1.0000 m[IU]/min | INTRAVENOUS | Status: DC
  Administered 2012-02-23: 2 m[IU]/min via INTRAVENOUS
  Filled 2012-02-23: qty 1000

## 2012-02-23 MED ORDER — IBUPROFEN 600 MG PO TABS: 600.0000 mg | ORAL_TABLET | Freq: Four times a day (QID) | ORAL | Status: DC | PRN

## 2012-02-23 MED ORDER — EMTRICITAB-RILPIVIR-TENOFOV DF 200-25-300 MG PO TABS
1.0000 | ORAL_TABLET | Freq: Every day | ORAL | Status: DC
  Administered 2012-02-23: 1 via ORAL

## 2012-02-23 MED ORDER — CITRIC ACID-SODIUM CITRATE 334-500 MG/5ML PO SOLN: 30.0000 mL | ORAL | Status: DC | PRN

## 2012-02-23 MED ORDER — OXYTOCIN 40 UNITS IN LACTATED RINGERS INFUSION - SIMPLE MED
62.5000 mL/h | Freq: Once | INTRAVENOUS | Status: AC
  Administered 2012-02-23: 62.5 mL/h via INTRAVENOUS

## 2012-02-23 MED ORDER — ZIDOVUDINE 10 MG/ML IV SOLN
2.0000 mg/kg | Freq: Once | INTRAVENOUS | Status: AC
  Administered 2012-02-23: 151 mg via INTRAVENOUS
  Filled 2012-02-23: qty 15.1

## 2012-02-23 MED ORDER — TERBUTALINE SULFATE 1 MG/ML IJ SOLN: 0.2500 mg | Freq: Once | INTRAMUSCULAR | Status: AC | PRN

## 2012-02-23 MED ORDER — LACTATED RINGERS IV SOLN
INTRAVENOUS | Status: DC
  Administered 2012-02-23 (×2): via INTRAVENOUS

## 2012-02-23 MED ORDER — ACETAMINOPHEN 325 MG PO TABS: 650.0000 mg | ORAL_TABLET | ORAL | Status: DC | PRN

## 2012-02-23 MED ORDER — FLEET ENEMA 7-19 GM/118ML RE ENEM: 1.0000 | ENEMA | RECTAL | Status: DC | PRN

## 2012-02-23 MED ORDER — ZIDOVUDINE 10 MG/ML IV SOLN
1.0000 mg/kg/h | INTRAVENOUS | Status: DC
  Administered 2012-02-23: 1 mg/kg/h via INTRAVENOUS
  Filled 2012-02-23: qty 40

## 2012-02-23 MED ORDER — NALBUPHINE SYRINGE 5 MG/0.5 ML
10.0000 mg | INJECTION | INTRAMUSCULAR | Status: DC
  Administered 2012-02-23: 10 mg via INTRAVENOUS
  Filled 2012-02-23 (×5): qty 1

## 2012-02-23 MED ORDER — OXYCODONE-ACETAMINOPHEN 5-325 MG PO TABS: 1.0000 | ORAL_TABLET | ORAL | Status: DC | PRN

## 2012-02-23 MED ORDER — LIDOCAINE HCL (PF) 1 % IJ SOLN: 30.0000 mL | INTRAMUSCULAR | Status: DC | PRN

## 2012-02-23 MED ORDER — LACTATED RINGERS IV SOLN: 500.0000 mL | INTRAVENOUS | Status: DC | PRN

## 2012-02-23 NOTE — H&P (Signed)
Catherine Grimes is a 31 y.o. female G1P0 at 51.0 EGA presenting for induction of labor due to HIV. History She denies any contractions, loss of fluid, or bleeding.  States the baby is moving a lot.  Her family knows about her diagnosis and it is okay for them to be in the delivery room with her throughout the induction process. OB History    Grav Para Term Preterm Abortions TAB SAB Ect Mult Living   1              Past Medical History  Diagnosis Date  . HIV infection   . Infection     HIV   Past Surgical History  Procedure Date  . No past surgeries    Family History: family history includes Diabetes in her mother. Social History:  reports that she has never smoked. She has never used smokeless tobacco. She reports that she does not drink alcohol or use illicit drugs.   Prenatal Transfer Tool  Maternal Diabetes: No Genetic Screening: Declined Maternal Ultrasounds/Referrals: Normal Fetal Ultrasounds or other Referrals:  None Maternal Substance Abuse:  No Significant Maternal Medications:  Meds include: Other: see prenatal record Significant Maternal Lab Results:  Lab values include: HIV positive Other Comments:  None  Review of Systems  Constitutional: Negative for fever and chills.  Eyes: Negative for blurred vision.  Respiratory: Negative for shortness of breath.   Cardiovascular: Negative for chest pain.  Neurological: Negative for dizziness and headaches.    Dilation: 1.5 Effacement (%): 50 Station: -2;-3 Exam by:: [email or web address],CNM Blood pressure 101/68, pulse 82, temperature 99.1 F (37.3 C), temperature source Oral, resp. rate 18, height 5\' 1"  (1.549 m), weight 75.297 kg (166 lb), last menstrual period 07/11/2011. Exam Physical Exam  Constitutional: She is oriented to person, place, and time. She appears well-developed and well-nourished.  HENT:  Head: Normocephalic and atraumatic.  Cardiovascular: Normal rate, regular rhythm and normal heart sounds.     Respiratory: Effort normal and breath sounds normal.  GI: Soft. There is no tenderness.       Gravid abdomen  Musculoskeletal: She exhibits no edema.  Neurological: She is alert and oriented to person, place, and time.  Psychiatric: She has a normal mood and affect.    Prenatal labs: ABO, Rh: --/--/O POS (07/28 0730) Antibody: NEG (07/28 0730) Rubella: 87.5 (01/29 1006) RPR: NON REAC (05/09 1008)  HBsAg: NEGATIVE (01/29 1006)  HIV: Reactive (06/11 0000)  GBS: Negative (07/11 0000)   Assessment/Plan: Patient is a G1P0 at 39.0 EGA being admitted for induction for HIV positive status.  Admit to birthing suite. Foley bulb placed. AZT to be started when foley comes out. Will support through labor.   Marikay Alar 02/23/2012, 11:09 AM  Agree with note.  Foley placed into posterior cervix. Cervix is soft and about 50% effaced.  I anticipate good progress with Foley bulb.  Wynelle Bourgeois CNM

## 2012-02-23 NOTE — Progress Notes (Signed)
Subjective: Patient doing well.  No complaints.  Objective: BP 90/52  Pulse 65  Temp 99.1 F (37.3 C) (Oral)  Resp 18  Ht 5\' 1"  (1.549 m)  Wt 75.297 kg (166 lb)  BMI 31.37 kg/m2  LMP 07/11/2011      FHT:  FHR: 145 bpm, variability: moderate,  accelerations:  Present,  decelerations:  Absent UC:   none SVE:   Dilation: 1.5 Effacement (%): 50 Station: -2;-3 Exam by:: mwilliams,CNM  Labs: Lab Results  Component Value Date   WBC 6.6 02/23/2012   HGB 13.4 02/23/2012   HCT 39.3 02/23/2012   MCV 88.1 02/23/2012   PLT 241 02/23/2012    Assessment / Plan: Continue current management of induction.  Foley bulb still in place.  Continue to check every hour.  Will continue to support patient through labor.  Marikay Alar 02/23/2012, 1:50 PM

## 2012-02-23 NOTE — Progress Notes (Signed)
0800 SShores, CNM notified pt here and ready for IOL orders.

## 2012-02-23 NOTE — Progress Notes (Signed)
Patient ID: Catherine Grimes, female   DOB: 1980-11-10, 31 y.o.   MRN: [id number]  Per Pharmacy, patient should take her own supply of Complera on schedule.  They will put in the order since it is non-formulary and pt will take her own supply.

## 2012-02-23 NOTE — Progress Notes (Signed)
Standing beside bed.  Foley is still in place.  Pitocin started at 1603 to enhance progress of Latent Phase.  We started at 58mu/min and will stay at 19mu/min until Foley falls out.  Cervical exam deferred for now  FHR reactive with good variability and accels, Category I  UCs irregular, but more frequent than beforeq 3-6 minutes  Will continue to observe

## 2012-02-23 NOTE — Progress Notes (Signed)
1012 Dr Drue Second notified pt here for IOL (on call for Infectious Disease).

## 2012-02-23 NOTE — Progress Notes (Signed)
Subjective: No complaints.   Objective: BP 105/57  Pulse 84  Temp 98.4 F (36.9 C) (Oral)  Resp 16  Ht 5\' 1"  (1.549 m)  Wt 75.297 kg (166 lb)  BMI 31.37 kg/m2  LMP 07/11/2011      FHT:  FHR: 140 bpm, variability: moderate,  accelerations:  Abscent,  decelerations:  Absent UC:   irregular, every 4-6 minutes SVE:   Dilation: 1.5 Effacement (%): 50 Station: -2;-3 Exam by:: S Aurélie.Currier RN  Labs: Lab Results  Component Value Date   WBC 6.6 02/23/2012   HGB 13.4 02/23/2012   HCT 39.3 02/23/2012   MCV 88.1 02/23/2012   PLT 241 02/23/2012    Assessment / Plan: Foley bulb still in place.  Will continue with induction.  Support patient through labor.  Marikay Alar 02/23/2012, 7:08 PM

## 2012-02-24 ENCOUNTER — Encounter (HOSPITAL_COMMUNITY): Payer: Self-pay

## 2012-02-24 MED ORDER — ZOLPIDEM TARTRATE 5 MG PO TABS: 5.0000 mg | ORAL_TABLET | Freq: Every evening | ORAL | Status: DC | PRN

## 2012-02-24 MED ORDER — OXYCODONE-ACETAMINOPHEN 5-325 MG PO TABS: 1.0000 | ORAL_TABLET | ORAL | Status: DC | PRN

## 2012-02-24 MED ORDER — ONDANSETRON HCL 4 MG PO TABS: 4.0000 mg | ORAL_TABLET | ORAL | Status: DC | PRN

## 2012-02-24 MED ORDER — IBUPROFEN 600 MG PO TABS
600.0000 mg | ORAL_TABLET | Freq: Four times a day (QID) | ORAL | Status: DC
  Administered 2012-02-24 – 2012-02-25 (×6): 600 mg via ORAL
  Filled 2012-02-24 (×5): qty 1

## 2012-02-24 MED ORDER — DIBUCAINE 1 % RE OINT: 1.0000 "application " | TOPICAL_OINTMENT | RECTAL | Status: DC | PRN

## 2012-02-24 MED ORDER — WITCH HAZEL-GLYCERIN EX PADS: 1.0000 "application " | MEDICATED_PAD | CUTANEOUS | Status: DC | PRN

## 2012-02-24 MED ORDER — DIPHENHYDRAMINE HCL 25 MG PO CAPS: 25.0000 mg | ORAL_CAPSULE | Freq: Four times a day (QID) | ORAL | Status: DC | PRN

## 2012-02-24 MED ORDER — ONDANSETRON HCL 4 MG/2ML IJ SOLN: 4.0000 mg | INTRAMUSCULAR | Status: DC | PRN

## 2012-02-24 MED ORDER — EMTRICITAB-RILPIVIR-TENOFOV DF 200-25-300 MG PO TABS
1.0000 | ORAL_TABLET | Freq: Every day | ORAL | Status: DC
  Administered 2012-02-24: 1 via ORAL

## 2012-02-24 MED ORDER — LANOLIN HYDROUS EX OINT: TOPICAL_OINTMENT | CUTANEOUS | Status: DC | PRN

## 2012-02-24 MED ORDER — SENNOSIDES-DOCUSATE SODIUM 8.6-50 MG PO TABS: 2.0000 | ORAL_TABLET | Freq: Every day | ORAL | Status: DC

## 2012-02-24 MED ORDER — PRENATAL MULTIVITAMIN CH
1.0000 | ORAL_TABLET | Freq: Every day | ORAL | Status: DC
  Administered 2012-02-24 – 2012-02-25 (×2): 1 via ORAL
  Filled 2012-02-24 (×4): qty 1

## 2012-02-24 MED ORDER — TETANUS-DIPHTH-ACELL PERTUSSIS 5-2.5-18.5 LF-MCG/0.5 IM SUSP: 0.5000 mL | Freq: Once | INTRAMUSCULAR | Status: DC

## 2012-02-24 MED ORDER — BENZOCAINE-MENTHOL 20-0.5 % EX AERO: 1.0000 "application " | INHALATION_SPRAY | CUTANEOUS | Status: DC | PRN

## 2012-02-24 MED ORDER — PRENATAL MULTIVITAMIN CH: 1.0000 | ORAL_TABLET | Freq: Every day | ORAL | Status: DC

## 2012-02-24 MED ORDER — SIMETHICONE 80 MG PO CHEW: 80.0000 mg | CHEWABLE_TABLET | ORAL | Status: DC | PRN

## 2012-02-24 MED ORDER — METRONIDAZOLE 500 MG PO TABS
500.0000 mg | ORAL_TABLET | Freq: Two times a day (BID) | ORAL | Status: DC
  Administered 2012-02-24 – 2012-02-25 (×3): 500 mg via ORAL
  Filled 2012-02-24 (×6): qty 1

## 2012-02-24 NOTE — Progress Notes (Signed)
UR chart review completed.  

## 2012-02-24 NOTE — Progress Notes (Signed)
Post Partum Day 1 Subjective: no complaints, up ad lib, voiding and tolerating PO  Objective: Blood pressure 114/62, pulse 76, temperature 98.9 F (37.2 C), temperature source Oral, resp. rate 18, height 5\' 1"  (1.549 m), weight 166 lb (75.297 kg), last menstrual period 07/11/2011, unknown if currently breastfeeding.  Physical Exam:  General: alert and no distress Lochia: appropriate Uterine Fundus: firm Incision: healing well DVT Evaluation: No evidence of DVT seen on physical exam.   Basename 02/23/12 0730  HGB 13.4  HCT 39.3    Assessment/Plan: Plan for discharge tomorrow   LOS: 1 day   [facility] 02/24/2012, 5:14 AM

## 2012-02-25 MED ORDER — IBUPROFEN 600 MG PO TABS: 600.0000 mg | ORAL_TABLET | Freq: Four times a day (QID) | ORAL | Status: AC

## 2012-02-25 NOTE — Clinical Social Work Maternal (Signed)
    Clinical Social Work Department PSYCHOSOCIAL ASSESSMENT - MATERNAL/CHILD 02/25/2012  Patient:  Catherine Grimes  Account Number:  [id number]  Admit Date:  02/23/2012  Marjo Bicker Name:   Sherren Kerns    Clinical Social Worker:  Andy Gauss   Date/Time:  02/24/2012 11:30 AM  Date Referred:  02/24/2012   Referral source  CN     Referred reason  O42   Other referral source:    I:  FAMILY / HOME ENVIRONMENT Child's legal guardian:  [employer] PARENT  Guardian - Name Guardian - Age Guardian - Address  Leonie Green [address] [place], [place] [address]  Cletis Athens Darrick Grinder  (same as above)   Other household support members/support persons Other support:   Family    II  PSYCHOSOCIAL DATA Information Source:  Patient Interview  Event organiser Employment:   Surveyor, quantity resources:  [employer] If Medicaid - County:  [place] Other  Sales executive  WIC   School / Grade:   Maternity Care Coordinator / Child Services Coordination / Early Interventions:  Cultural issues impacting care:    III  STRENGTHS Strengths  Adequate Resources  Home prepared for Child (including basic supplies)  Supportive family/friends   Strength comment:    IV  RISK FACTORS AND CURRENT PROBLEMS Current Problem:  YES   Risk Factor & Current Problem Patient Issue Family Issue Risk Factor / Current Problem Comment  Other - See comment Y N O42    V  SOCIAL WORK ASSESSMENT Sw met with pt to assess the situation and schedule follow up ID appointment for the infant.  Pt received diagnose, in 2011.  While she reports that she felt "sad," initially, she is a lot better now.  Pt never took medication or participated in counseling sessions to help deal with depression.  She denies any current depression or history of SI.  Pt's spouse was present during conversation and appears to be appropriate. Some of pt's family is aware of diagnoses and supportive.  Sw  explained the importance of the infant following up at an ID clinic, in addition to regular routine follow appointment.  Pt would like to take the infant to [facility].  The infant has an appointment 03/04/12 @ 11am.  Pt has transportation.  Sw will provide the pt with appointment information.  Pt appears to be appropriate and bonding well with the infant.  She has all the necessary supplies for the baby.  Sw is available to assist further if needed.      VI SOCIAL WORK PLAN Social Work Plan  No Further Intervention Required / No Barriers to Discharge   Type of pt/family education:   If child protective services report - county:   If child protective services report - date:   Information/referral to community resources comment:   [facility] ID clinic   Other social work plan:

## 2012-02-25 NOTE — Discharge Summary (Signed)
I examined pt and agree with documentation above and resident plan of care. MUHAMMAD,WALIDAH  

## 2012-02-25 NOTE — Progress Notes (Signed)
[facility] Department of Clinical Social Work Documentation of Interpretation   I assisted __Courtney  RN_________________ with interpretation of __discharge____________________ for this patient.

## 2012-02-25 NOTE — Discharge Summary (Addendum)
Obstetric Discharge Summary Reason for Admission: induction of labor Prenatal Procedures: none Intrapartum Procedures: spontaneous vaginal delivery and AZT Postpartum Procedures: none Complications-Operative and Postpartum: 1st degree perineal laceration Hemoglobin  Date Value Range Status  02/23/2012 13.4  12.0 - 15.0 g/dL Final     HCT  Date Value Range Status  02/23/2012 39.3  36.0 - 46.0 % Final    Physical Exam:  General: alert and cooperative Lochia: appropriate Uterine Fundus: firm Incision: n/a DVT Evaluation: No evidence of DVT seen on physical exam. Negative Homan's sign. No cords or calf tenderness. No significant calf/ankle edema.  Discharge Diagnoses: Term Pregnancy-delivered  Discharge Information: Date: 02/25/2012 Activity: unrestricted Diet: routine Medications: PNV and Ibuprofen Condition: stable Instructions: refer to practice specific booklet Discharge to: home   Newborn Data: Live born female  Birth Weight: 5 lb 13.8 oz (2659 g) APGAR: , 9  Home with mother.  Marikay Alar 02/25/2012, 7:23 AM

## 2012-02-27 ENCOUNTER — Encounter (HOSPITAL_COMMUNITY): Payer: Self-pay

## 2012-03-01 NOTE — Discharge Summary (Signed)
Attestation of Attending Supervision of Resident: Evaluation and management procedures were performed by the Family Medicine Resident under my supervision.  I have seen and examined the patient, reviewed the resident's note and chart, and I agree with the management and plan.  Taro Hidrogo L Harraway-Smith, M.D. 03/01/2012 10:31 AM  

## 2012-03-26 ENCOUNTER — Encounter: Payer: Self-pay | Admitting: Physician Assistant

## 2012-03-26 ENCOUNTER — Ambulatory Visit (INDEPENDENT_AMBULATORY_CARE_PROVIDER_SITE_OTHER): Payer: Self-pay | Admitting: Physician Assistant

## 2012-03-26 VITALS — BP 120/78 | HR 72 | Temp 98.3°F | Ht 60.5 in | Wt 148.3 lb

## 2012-03-26 DIAGNOSIS — B20 Human immunodeficiency virus [HIV] disease: Secondary | ICD-10-CM

## 2012-03-26 NOTE — Progress Notes (Signed)
C/o nipple area itchy , sore - is not breastfeeding .

## 2012-03-26 NOTE — Progress Notes (Signed)
  Subjective:     Catherine Grimes is a 31 y.o. female who presents for a postpartum visit. She is 4 weeks postpartum following a spontaneous vaginal delivery. I have fully reviewed the prenatal and intrapartum course. The delivery was at 39 gestational weeks. Outcome: spontaneous vaginal delivery.  Postpartum course has been uncomplicated. Baby's course has been uncomplicated. Baby is feeding by bottle - . Bleeding no bleeding. Bowel function is normal. Bladder function is normal. Patient is not sexually active. Contraception method is abstinence and condoms. Postpartum depression screening: negative. 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 Pt has been compliant with anti-virals since delivery and has follow up appt with Dr. Ninetta Lights in Sept.   The following portions of the patient's history were reviewed and updated as appropriate: allergies, current medications, past family history, past medical history, past social history, past surgical history and problem list.  Review of Systems A comprehensive review of systems was negative.   Objective:    BP 120/78  Pulse 72  Temp 98.3 F (36.8 C)  Ht 5' 0.5" (1.537 m)  Wt 148 lb 4.8 oz (67.268 kg)  BMI 28.49 kg/m2  Breastfeeding? No  General:  alert, cooperative and no distress   Breasts:  inspection negative, no nipple discharge or bleeding, no masses or nodularity palpable  Abdomen: soft, non-tender; bowel sounds normal; no masses,  no organomegaly        Assessment:    4 postpartum exam. Pap smear not done at today's visit.   Plan:    1. Contraception: condoms 2. Follow up in: 1 year or as needed.

## 2012-03-26 NOTE — Patient Instructions (Signed)
Eleccin del mtodo anticonceptivo  (Contraception Choices) El control de la natalidad (contracecin) impide que el embarazo ocurra. Los diferentes tipos de anticonceptivos funcionan de diferentes maneras. Algunos pueden:   Hacer que el moco del cuello del tero se espese. Esto les dificulta a los espermatozoides llegar al tero.   Hacer ms delgado el el revestimiento del tero. Esto dificulta que el vulo se adhiera a la pared del tero.   Impedir que los ovarios liberen un vulo.   Impedir que el esperma llegue al vulo.  Con ciertos tipos de ciruga se puede impedir que el embarazo ocurra. En las mujeres, una ciruga cierra las trompas de Falopio (ligadura de trompas). En los hombres, la ciruga impide que los espermatozoides se liberen durante el acto sexual (vasectoma).  ANTICONCEPTIVOS HORMONALES  Los anticonceptivos hormonales impiden el embarazo, agregando hormonas al organismo. Los tipos de anticonceptivos son:  Un pequeo tubo colocado bajo la piel de la parte superior del brazo (implante). El tubo puede permanecer en el lugar durante 3 aos.   Vacunas administradas cada 3 meses.   Pastillas que se toman todos los das o una vez despus de tener sexo (relaciones sexuales).   Parches que se cambian una vez por semana.   Un anillo que se coloca en la vagina (anillos vaginales). El anillo se deja en su lugar durante 3 semanas y se retira durante 1 semana Luego se coloca un nuevo anillo.  ANTICONCEPTIVOS DE BARRERA  Los anticonceptivos de barrera impiden que los espermatozoides lleguen al vulo. Estos tipos de anticonceptivos son:   Una cubierta delgada que se usa sobe el pene (condn masculino) que se coloca durante las relaciones sexuales.   Una cubierta blanda y suelta que se coloca en la vagina (condn femenino) antes de las relaciones sexuales.   Un cuenco de goma que se aplica sobre el cuello del tero (diafragma). Este cuenco debe hacerse para usted. Se coloca en la  vagina antes de tener relaciones sexuales. Debe dejar el diafragma colocado en la vagina durante 6 a 8 horas despus de las relaciones sexuales.   Un capuchn pequeo y suave que se fijo sobre el cuello del tero (capuchn cervical). Este capuchn debe hacerse para usted. Debe dejarlo colocado en la vagina durante 48 horas despus de las relaciones sexuales.   Una esponja que se coloca en la vagina antes de tener relaciones sexuales.   Una sustancia qumica que destruye o impide que los espermatozoides ingresen al cuello y al tero (espermicida). La sustancia qumica puede ser en crema, gel, espuma o pldoras.  DISPOSITIVO DE CONTROL INTRAUTERINO (DIU)  El DIU es un pequeo dispositivo plstico en forma de T. Se coloca dentro del tero. Hay dos tipos de DIU:   DIU de cobre El dispositivo est cubierto en alambre de cobre. El cobre produce un lquido que destruye los espermatozoides. Puede permanecer colocado durante 10 aos.   DIU hormonal La hormona impide que ocurra el embarazo. Puede permanecer colocado durante 5 aos.  CONTROL DE LA NATALIDAD POR PLANIFICACIN FAMILIAR NATURAL  La planificacin familiar natural significa no tener relaciones sexuales o usar un mtodo anticonceptivo de barrera en los perodos frtiles de la mujer. Una mujer puede:   Usar un calendario para saber cul es su perodo frtil.   Emplear un termmetro para medir la temperatura corporal.  Protjase de las enfermedades de transmisin sexual cualquiera sea el mtodo que utilice. Hable con su mdico acerca de cul es el mejor mtodo anticonceptivo para usted.  Document Released:   10/30/2010 Document Revised: 07/04/2011 [employer] Patient Information 2012 [employer]. 

## 2012-03-27 ENCOUNTER — Other Ambulatory Visit: Payer: Self-pay | Admitting: *Deleted

## 2012-03-27 DIAGNOSIS — B20 Human immunodeficiency virus [HIV] disease: Secondary | ICD-10-CM

## 2012-03-27 MED ORDER — EMTRICITAB-RILPIVIR-TENOFOV DF 200-25-300 MG PO TABS: 1.0000 | ORAL_TABLET | Freq: Every day | ORAL | Status: DC

## 2012-04-13 ENCOUNTER — Emergency Department (HOSPITAL_COMMUNITY)
Admission: EM | Admit: 2012-04-13 | Discharge: 2012-04-13 | Disposition: A | Discharge status: Home / Self Care | Payer: No Typology Code available for payment source | Attending: Emergency Medicine | Admitting: Emergency Medicine

## 2012-04-13 ENCOUNTER — Encounter (HOSPITAL_COMMUNITY): Payer: Self-pay | Admitting: Emergency Medicine

## 2012-04-13 ENCOUNTER — Emergency Department (HOSPITAL_COMMUNITY): Payer: No Typology Code available for payment source

## 2012-04-13 DIAGNOSIS — Z21 Asymptomatic human immunodeficiency virus [HIV] infection status: Secondary | ICD-10-CM | POA: Insufficient documentation

## 2012-04-13 DIAGNOSIS — M25561 Pain in right knee: Secondary | ICD-10-CM

## 2012-04-13 DIAGNOSIS — R079 Chest pain, unspecified: Secondary | ICD-10-CM | POA: Insufficient documentation

## 2012-04-13 DIAGNOSIS — IMO0002 Reserved for concepts with insufficient information to code with codable children: Secondary | ICD-10-CM | POA: Insufficient documentation

## 2012-04-13 MED ORDER — IBUPROFEN 800 MG PO TABS: 800.0000 mg | ORAL_TABLET | Freq: Three times a day (TID) | ORAL | Status: DC | PRN
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
## 2012-04-13 NOTE — ED Provider Notes (Signed)
Medical screening examination/treatment/procedure(s) were performed by non-physician practitioner and as supervising physician I was immediately available for consultation/collaboration.  Catherine Grimes T Courtland Reas, MD 04/13/12 2339 

## 2012-04-13 NOTE — ED Notes (Signed)
Patient refuses crutches
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
## 2012-04-14 ENCOUNTER — Other Ambulatory Visit: Payer: Self-pay

## 2012-04-14 DIAGNOSIS — B20 Human immunodeficiency virus [HIV] disease: Secondary | ICD-10-CM

## 2012-04-15 LAB — COMPLETE METABOLIC PANEL WITH GFR
ALT: 17 U/L (ref 0–35)
AST: 18 U/L (ref 0–37)
Alkaline Phosphatase: 75 U/L (ref 39–117)
CO2: 28 mEq/L (ref 19–32)
Sodium: 139 mEq/L (ref 135–145)
Total Bilirubin: 0.4 mg/dL (ref 0.3–1.2)
Total Protein: 6.9 g/dL (ref 6.0–8.3)

## 2012-04-15 LAB — HIV-1 RNA QUANT-NO REFLEX-BLD
HIV 1 RNA Quant: <20 copies/mL (ref <20)
HIV-1 RNA Quant, Log: <1.3 {Log} (ref <1.30)

## 2012-04-15 LAB — CBC
HCT: 36.3 % (ref 36.0–46.0)
Platelets: 278 10*3/uL (ref 150–400)
RDW: 13.2 % (ref 11.5–15.5)
WBC: 5.9 10*3/uL (ref 4.0–10.5)

## 2012-04-15 LAB — T-HELPER CELL (CD4) - (RCID CLINIC ONLY): CD4 % Helper T Cell: 42 % (ref 33–55)

## 2012-04-28 ENCOUNTER — Ambulatory Visit: Payer: Self-pay | Admitting: Infectious Diseases

## 2012-05-07 ENCOUNTER — Ambulatory Visit (INDEPENDENT_AMBULATORY_CARE_PROVIDER_SITE_OTHER): Payer: Medicaid Other | Admitting: Infectious Diseases

## 2012-05-07 ENCOUNTER — Encounter: Payer: Self-pay | Admitting: Infectious Diseases

## 2012-05-07 VITALS — BP 104/68 | HR 74 | Temp 98.3°F | Ht 61.0 in | Wt 151.0 lb

## 2012-05-07 DIAGNOSIS — Z79899 Other long term (current) drug therapy: Secondary | ICD-10-CM

## 2012-05-07 DIAGNOSIS — Z23 Encounter for immunization: Secondary | ICD-10-CM

## 2012-05-07 DIAGNOSIS — B20 Human immunodeficiency virus [HIV] disease: Secondary | ICD-10-CM

## 2012-05-07 DIAGNOSIS — M1250 Traumatic arthropathy, unspecified site: Secondary | ICD-10-CM | POA: Insufficient documentation

## 2012-05-07 DIAGNOSIS — Z113 Encounter for screening for infections with a predominantly sexual mode of transmission: Secondary | ICD-10-CM

## 2012-05-07 NOTE — Assessment & Plan Note (Signed)
She's doing very well, not breast feeding. Her babies tests are (-) x1, 2nd pending. She will get flu shot, condoms today. She will rtc in 6 months with labs prior.
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
## 2012-05-07 NOTE — Assessment & Plan Note (Signed)
I suggested she try ice/ibuprofen. She would like also to be seen ortho.

## 2012-06-17 ENCOUNTER — Ambulatory Visit: Payer: Self-pay | Admitting: Infectious Diseases

## 2012-06-22 ENCOUNTER — Ambulatory Visit (INDEPENDENT_AMBULATORY_CARE_PROVIDER_SITE_OTHER): Payer: Self-pay | Admitting: Infectious Diseases

## 2012-06-22 VITALS — BP 111/77 | HR 80 | Temp 98.3°F | Ht 61.0 in | Wt 154.5 lb

## 2012-06-22 DIAGNOSIS — M1250 Traumatic arthropathy, unspecified site: Secondary | ICD-10-CM

## 2012-06-22 DIAGNOSIS — B20 Human immunodeficiency virus [HIV] disease: Secondary | ICD-10-CM

## 2012-06-22 NOTE — Assessment & Plan Note (Signed)
She is doing very well. Has gotten flu shot, needs comdons. Her babies tests are (-), she is not breast feeding. We spoke at length with interpreter about her becoming pregnant again.  Her husband needs to be tested yearly. I suggested her could start PrEP (TRV) but would need payment source as med is very expensive... Her risk of infecting her potential baby, current husband is very low. They will discuss his options. Will see her back in 6 months with labs.
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
## 2012-06-22 NOTE — Assessment & Plan Note (Signed)
Will try to get her re-scheduled with ortho.

## 2012-08-17 ENCOUNTER — Ambulatory Visit: Payer: Self-pay

## 2012-08-26 ENCOUNTER — Other Ambulatory Visit: Payer: Self-pay | Admitting: *Deleted

## 2012-08-26 DIAGNOSIS — B20 Human immunodeficiency virus [HIV] disease: Secondary | ICD-10-CM

## 2012-08-26 MED ORDER — EMTRICITAB-RILPIVIR-TENOFOV DF 200-25-300 MG PO TABS: 1.0000 | ORAL_TABLET | Freq: Every day | ORAL | Status: DC

## 2012-08-27 ENCOUNTER — Other Ambulatory Visit: Payer: Self-pay | Admitting: Infectious Diseases

## 2012-08-27 DIAGNOSIS — B20 Human immunodeficiency virus [HIV] disease: Secondary | ICD-10-CM

## 2012-08-31 ENCOUNTER — Other Ambulatory Visit: Payer: Self-pay | Admitting: Licensed Clinical Social Worker

## 2012-08-31 DIAGNOSIS — B20 Human immunodeficiency virus [HIV] disease: Secondary | ICD-10-CM

## 2012-08-31 MED ORDER — EMTRICITAB-RILPIVIR-TENOFOV DF 200-25-300 MG PO TABS: 1.0000 | ORAL_TABLET | Freq: Every day | ORAL | Status: DC

## 2012-09-07 ENCOUNTER — Other Ambulatory Visit: Payer: Self-pay | Admitting: Licensed Clinical Social Worker

## 2012-09-07 DIAGNOSIS — B20 Human immunodeficiency virus [HIV] disease: Secondary | ICD-10-CM

## 2012-09-07 MED ORDER — EMTRICITAB-RILPIVIR-TENOFOV DF 200-25-300 MG PO TABS: 1.0000 | ORAL_TABLET | Freq: Every day | ORAL | Status: DC

## 2012-09-08 ENCOUNTER — Other Ambulatory Visit: Payer: Self-pay | Admitting: Infectious Diseases

## 2012-09-22 ENCOUNTER — Ambulatory Visit: Payer: Self-pay | Admitting: Infectious Diseases

## 2012-09-22 ENCOUNTER — Telehealth: Payer: Self-pay | Admitting: *Deleted

## 2012-09-22 NOTE — Telephone Encounter (Signed)
Called to notify patient she no showed her appt today. She said she would have to call back to reschedule Catherine Grimes

## 2012-10-05 ENCOUNTER — Ambulatory Visit (INDEPENDENT_AMBULATORY_CARE_PROVIDER_SITE_OTHER): Payer: Self-pay | Admitting: Infectious Diseases

## 2012-10-05 ENCOUNTER — Encounter: Payer: Self-pay | Admitting: Infectious Diseases

## 2012-10-05 VITALS — BP 102/66 | HR 63 | Temp 98.1°F | Ht 61.0 in | Wt 157.0 lb

## 2012-10-05 DIAGNOSIS — M1250 Traumatic arthropathy, unspecified site: Secondary | ICD-10-CM

## 2012-10-05 DIAGNOSIS — B20 Human immunodeficiency virus [HIV] disease: Secondary | ICD-10-CM

## 2012-10-05 DIAGNOSIS — Z113 Encounter for screening for infections with a predominantly sexual mode of transmission: Secondary | ICD-10-CM

## 2012-10-05 DIAGNOSIS — Z79899 Other long term (current) drug therapy: Secondary | ICD-10-CM

## 2012-10-05 LAB — LIPID PANEL
Cholesterol: 115 mg/dL (ref 0–200)
Total CHOL/HDL Ratio: 3.1 Ratio
Triglycerides: 100 mg/dL (ref <150)
VLDL: 20 mg/dL (ref 0–40)

## 2012-10-05 LAB — COMPREHENSIVE METABOLIC PANEL
ALT: 72 U/L — ABNORMAL HIGH (ref 0–35)
CO2: 29 mEq/L (ref 19–32)
Creat: 0.62 mg/dL (ref 0.50–1.10)
Total Bilirubin: 0.5 mg/dL (ref 0.3–1.2)

## 2012-10-05 LAB — CBC
MCH: 30.1 pg (ref 26.0–34.0)
MCV: 87 fL (ref 78.0–100.0)
Platelets: 298 10*3/uL (ref 150–400)
RBC: 4.39 MIL/uL (ref 3.87–5.11)
RDW: 13.2 % (ref 11.5–15.5)

## 2012-10-05 NOTE — Assessment & Plan Note (Signed)
Will check MRI of her spine. Have her seen by ortho depending on result.Marland KitchenMarland Kitchen

## 2012-10-05 NOTE — Progress Notes (Signed)
  Subjective:    Patient ID: Catherine Grimes, female    DOB: 1981/06/26, 32 y.o.   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 Husband is HIV-. Babies tests all (-) Continues to have back and R hip pain. No problems with walking. No change in BM or urine.    No problems with complera, not taking PNV.   HIV 1 RNA Quant (copies/mL)  Date Value  04/14/2012 <20   02/20/2012 <20   01/07/2012 <20      CD4 T Cell Abs (cmm)  Date Value  04/14/2012 710   01/07/2012 680   12/04/2011 610       Review of Systems  Musculoskeletal: Positive for back pain.       Objective:   Physical Exam  Constitutional: She appears well-developed and well-nourished.  HENT:  Mouth/Throat: No oropharyngeal exudate.  Eyes: EOM are normal. Pupils are equal, round, and reactive to light.  Neck: Neck supple.  Cardiovascular: Normal rate, regular rhythm and normal heart sounds.   Pulmonary/Chest: Effort normal and breath sounds normal.  Abdominal: Soft. Bowel sounds are normal. There is no tenderness. There is no rebound.  Musculoskeletal: She exhibits no edema.  Lymphadenopathy:    She has no cervical adenopathy.  Neurological: No sensory deficit. She exhibits normal muscle tone. Coordination normal.          Assessment & Plan:

## 2012-10-05 NOTE — Addendum Note (Signed)
Addended by: HATCHER, JEFFREY C on: 10/05/2012 11:50 AM   Modules accepted: Orders

## 2012-10-05 NOTE — Addendum Note (Signed)
Addended by: Mariea Clonts D on: 10/05/2012 02:14 PM   Modules accepted: Orders

## 2012-10-05 NOTE — Assessment & Plan Note (Signed)
Will check her labs today, Hep B S Ab f/u. . Will give her condoms. i encouraged her to bring her husband in for testing in the lab here (free, no appt needed). rtc 6 months.

## 2012-10-06 LAB — HIV-1 RNA QUANT-NO REFLEX-BLD: HIV 1 RNA Quant: <20 copies/mL (ref <20)

## 2012-10-06 LAB — HEPATITIS B SURFACE ANTIBODY,QUALITATIVE: Hep B S Ab: NONREACTIVE

## 2012-10-07 ENCOUNTER — Ambulatory Visit (HOSPITAL_COMMUNITY): Admission: RE | Admit: 2012-10-07 | Payer: Self-pay | Source: Ambulatory Visit

## 2012-10-12 ENCOUNTER — Ambulatory Visit (HOSPITAL_COMMUNITY): Admission: RE | Admit: 2012-10-12 | Payer: Self-pay | Source: Ambulatory Visit

## 2012-10-19 ENCOUNTER — Ambulatory Visit (HOSPITAL_COMMUNITY)
Admission: RE | Admit: 2012-10-19 | Discharge: 2012-10-19 | Disposition: A | Discharge status: Home / Self Care | Payer: Self-pay | Source: Ambulatory Visit | Attending: Infectious Diseases | Admitting: Infectious Diseases

## 2012-10-19 DIAGNOSIS — M545 Low back pain, unspecified: Secondary | ICD-10-CM | POA: Insufficient documentation

## 2012-10-19 DIAGNOSIS — M25559 Pain in unspecified hip: Secondary | ICD-10-CM | POA: Insufficient documentation

## 2012-10-19 DIAGNOSIS — M1250 Traumatic arthropathy, unspecified site: Secondary | ICD-10-CM | POA: Insufficient documentation

## 2012-11-02 ENCOUNTER — Other Ambulatory Visit: Payer: Self-pay

## 2012-11-16 ENCOUNTER — Ambulatory Visit: Payer: Self-pay | Admitting: Infectious Diseases

## 2012-12-21 IMAGING — US US OB COMP LESS 14 WK
1 series · 13 of 28 positions shown · non-contrast
Comparison: none

[Series 1: us ob comp less 14 wks · 13 of 43 slices shown]
[im 2/43]
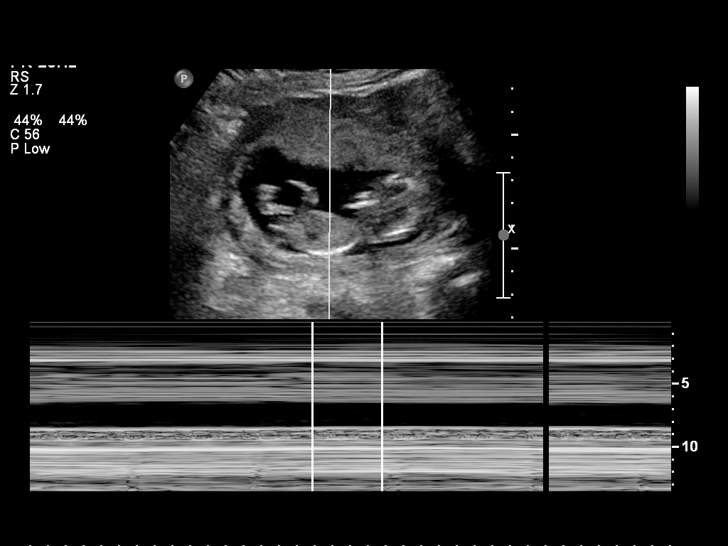
[im 5/43]
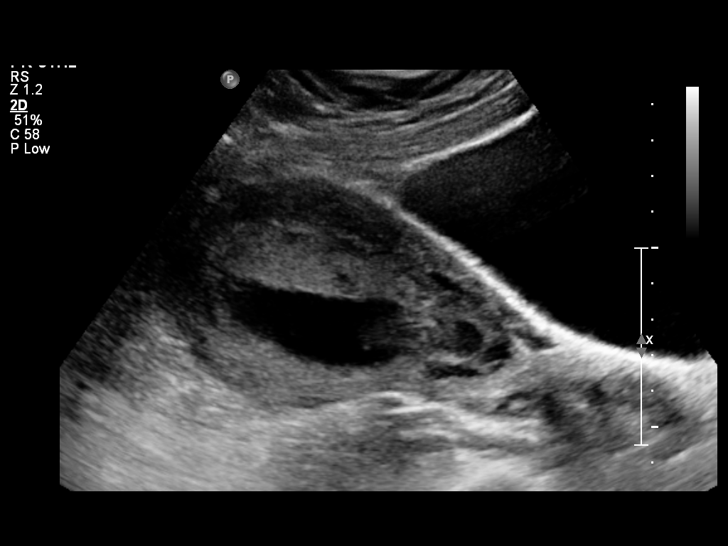
[im 8/43]
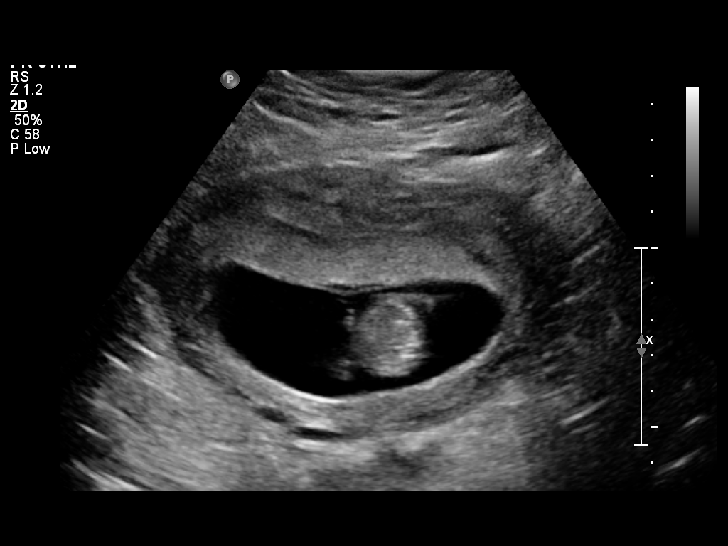
[im 11/43]
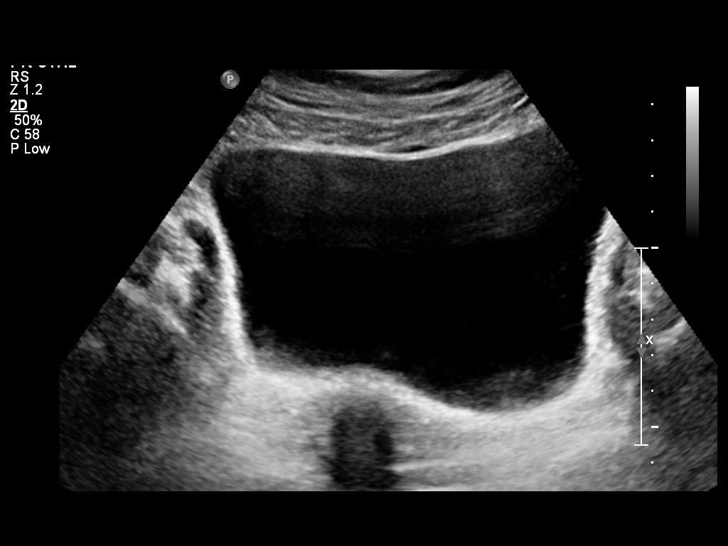
[im 15/43]
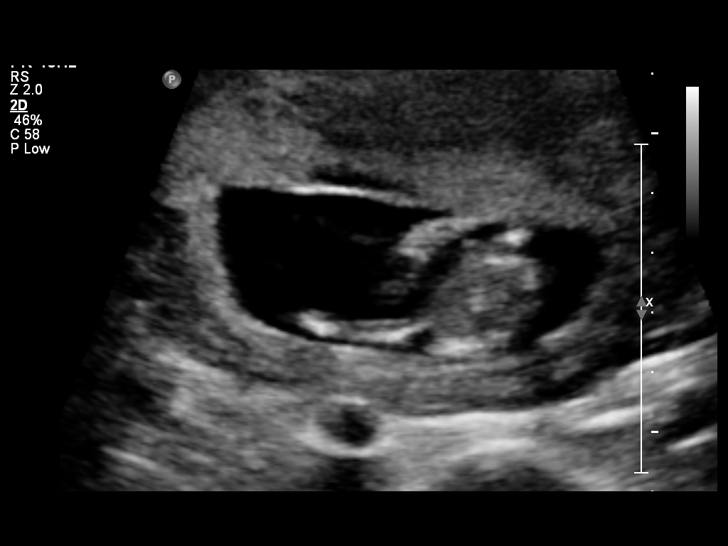
[im 18/43]
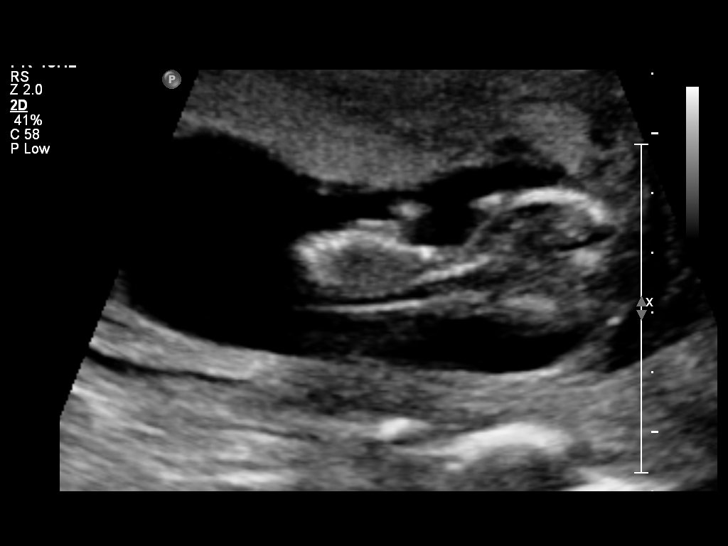
[im 22/43]
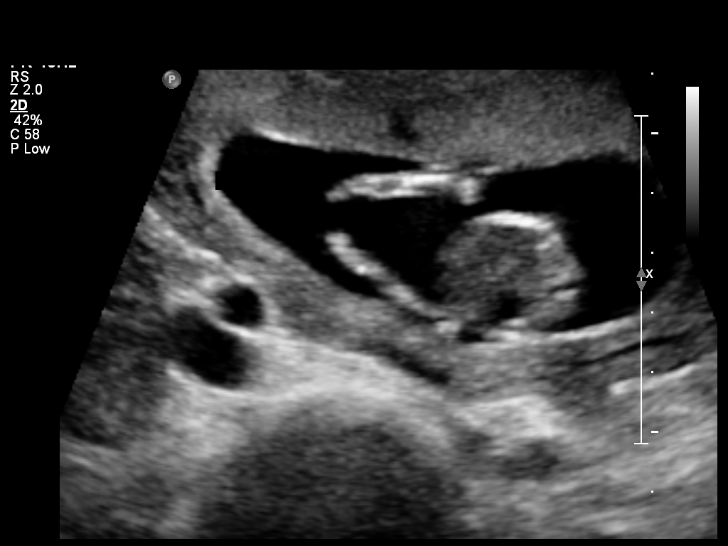
[im 25/43]
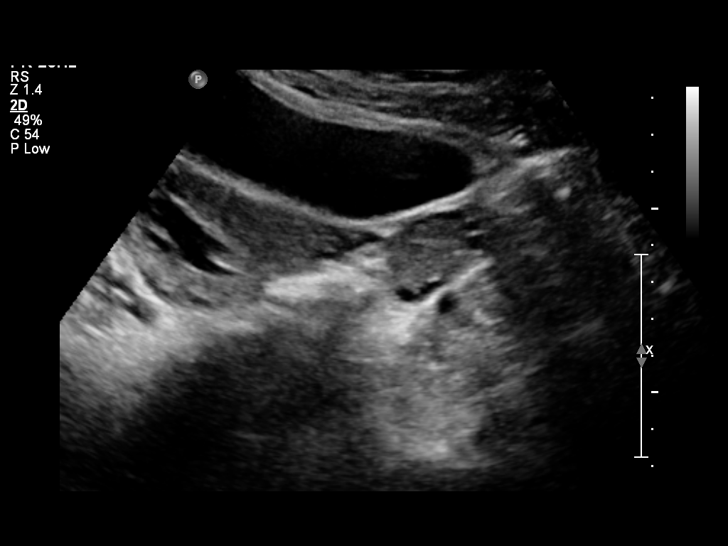
[im 29/43]
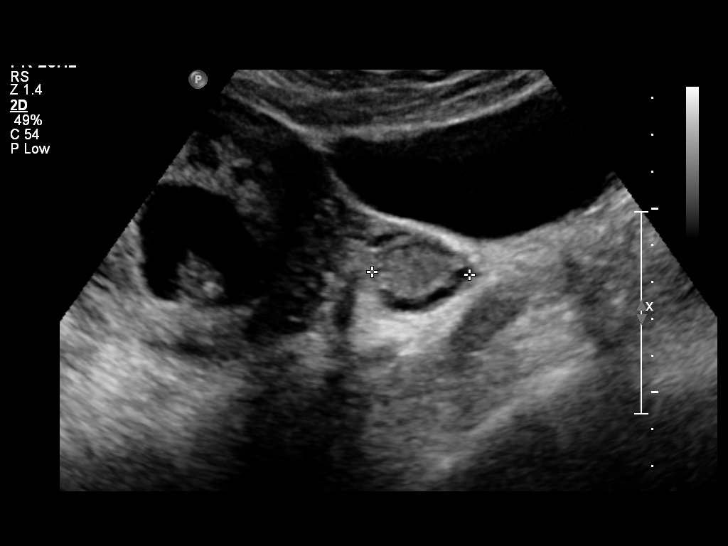
[im 32/43]
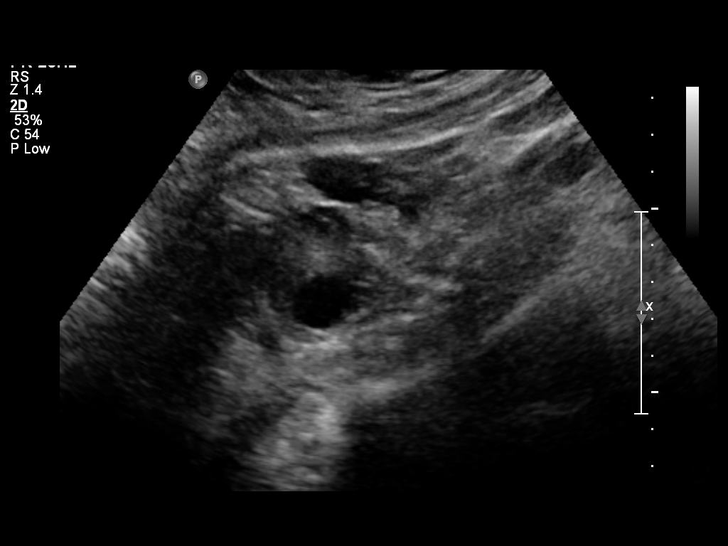
[im 35/43]
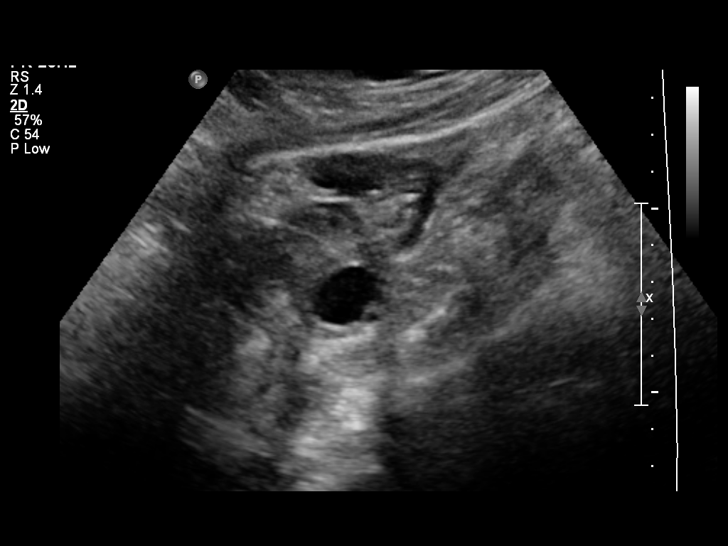
[im 38/43]
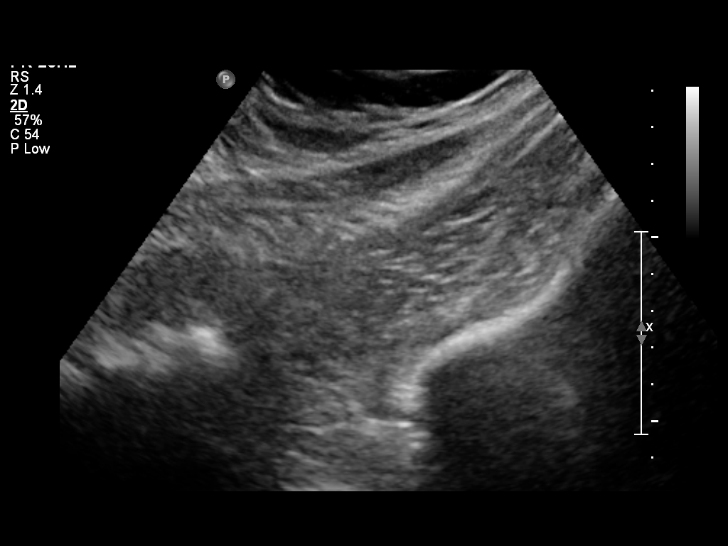
[im 41/43]
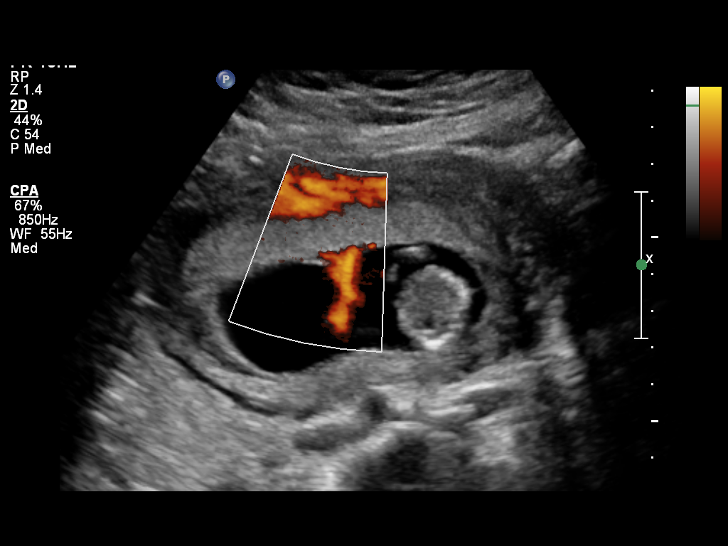

[13 of 28 positions shown; findings below may reference images not displayed]

OBSTETRICS REPORT
                      (Signed Final 08/28/2011 [DATE])

 Order#:         [id number]
Procedures

 US OB COMP LESS 14 WKS                                76801.0
Indications

 Unsure of LMP;  Establish Gestational [AGE]
 HIV: Asymptomatic                                     647.63 V08
Fetal Evaluation

 Fetal Heart Rate:  153                         bpm
 Cardiac Activity:  Observed
 Presentation:      Cephalic
 Placenta:          Anterior
 P. Cord            Visualized
 Insertion:

 Amniotic Fluid
 AFI FV:      Subjectively within normal limits
                                             Larg Pckt:     4.2  cm
Biometry

 CRL:     76.2  mm    G. Age:   13w 3d                 EDD:   03/01/12
Gestational Age

 LMP:           6w 4d        Date:   07/13/11                 EDD:   04/18/12
 Best:          13w 3d    Det. By:   U/S C R L (08/28/11)     EDD:   03/01/12
Anatomy

 Choroid Plexus:    Appears normal      Cord Vessels:      Appears normal
                                                           (3 vessel cord)
 Stomach:           Appears             Limbs:             Four extremities
                    normal, left                           seen
                    sided
 Abdomen:           Appears normal

 Other:     Technically difficult due to early GA.
Cervix Uterus Adnexa
 Cervix:       Closed.
 Uterus:       Normal shape and size.
 Cul De Sac:   No free fluid seen.
 Left Ovary:   Within normal limits.
 Right Ovary:  Within normal limits. Small corpus luteum noted.

 Adnexa:     No abnormality visualized.
Impression

 Single living IUP with US Gest. Age of 13w 3d, and EDD of
 03/01/2012.
 Suboptimal evaluation of anatomy due to early GA, but no
 early fetal anomalies visualized.
 No significant maternal uterine or adnexal abnormality
 identified.
Recommendations

 US for fetal anatomic evaluation at 18-19 wks GA.

## 2013-01-11 ENCOUNTER — Telehealth: Payer: Self-pay | Admitting: *Deleted

## 2013-01-11 NOTE — Telephone Encounter (Signed)
Patient called through an interpreter to advise that she has run a low grade fever since Thursday 01/07/13 100.5-101 and she feels very bad. She reports body aches, nauesa and no appetite also. Asked the patient is she could get in tomorrow and she advised yes and she would like to come. Gave her the 1115 am with Dr Luciana Axe.

## 2013-01-12 ENCOUNTER — Ambulatory Visit (INDEPENDENT_AMBULATORY_CARE_PROVIDER_SITE_OTHER): Payer: Self-pay | Admitting: Internal Medicine

## 2013-01-12 ENCOUNTER — Encounter: Payer: Self-pay | Admitting: Internal Medicine

## 2013-01-12 VITALS — BP 104/63 | HR 78 | Temp 99.2°F | Wt 151.0 lb

## 2013-01-12 DIAGNOSIS — K089 Disorder of teeth and supporting structures, unspecified: Secondary | ICD-10-CM

## 2013-01-12 DIAGNOSIS — B20 Human immunodeficiency virus [HIV] disease: Secondary | ICD-10-CM

## 2013-01-12 DIAGNOSIS — K0889 Other specified disorders of teeth and supporting structures: Secondary | ICD-10-CM

## 2013-01-12 DIAGNOSIS — N912 Amenorrhea, unspecified: Secondary | ICD-10-CM

## 2013-01-12 LAB — POCT URINE PREGNANCY: Preg Test, Ur: NEGATIVE

## 2013-01-12 NOTE — Assessment & Plan Note (Signed)
She does have irregular menstrual cycles. Her pregnancy test that was negative

## 2013-01-12 NOTE — Progress Notes (Signed)
  Subjective:    Patient ID: Catherine Grimes, female    DOB: 11/27/1980, 32 y.o.   MRN: [id number]  HPI She comes in as a work in visit. She is on Complera and reports good compliance. She has a complaint of her right cheek and teeth hurting on the right lower portion. She has noted her wisdom tooth coming out. Some subjective fever.     Review of Systems  Constitutional: Positive for fever. Negative for fatigue and unexpected weight change.  HENT: Positive for dental problem.   Gastrointestinal: Negative for nausea and diarrhea.  Genitourinary: Positive for menstrual problem.  Skin: Negative for rash.       Objective:   Physical Exam  Constitutional: She appears well-developed and well-nourished. No distress.  HENT:  Her right lower wisdom tooth appears to be coming out and is causing her pain and her other teeth and jaw.          Assessment & Plan:

## 2013-01-12 NOTE — Assessment & Plan Note (Signed)
It seems to be a problem with her wisdom tooth coming out. She will be referred to the dentist as soon as possible.

## 2013-02-08 ENCOUNTER — Ambulatory Visit: Payer: Self-pay

## 2013-02-08 IMAGING — US US OB DETAIL+14 WK
1 series · 12 of 28 positions shown · non-contrast
Comparison: none

[Series 1: us ob detail +14 wk · 12 of 66 slices shown]
[im 3/66]
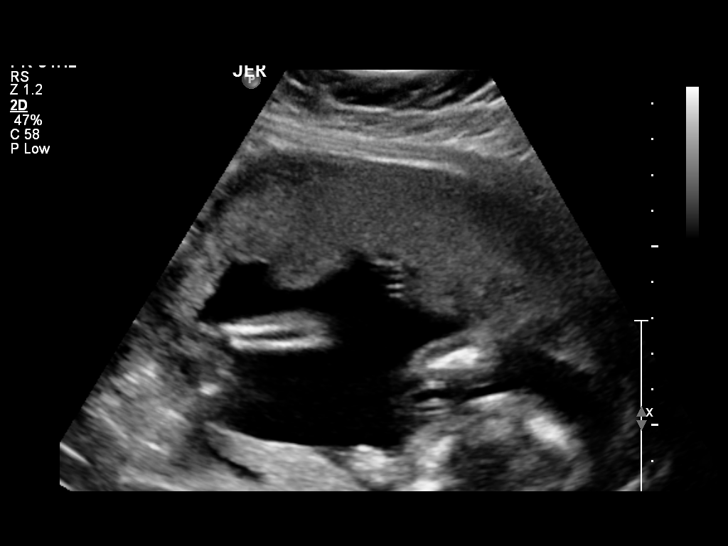
[im 8/66]
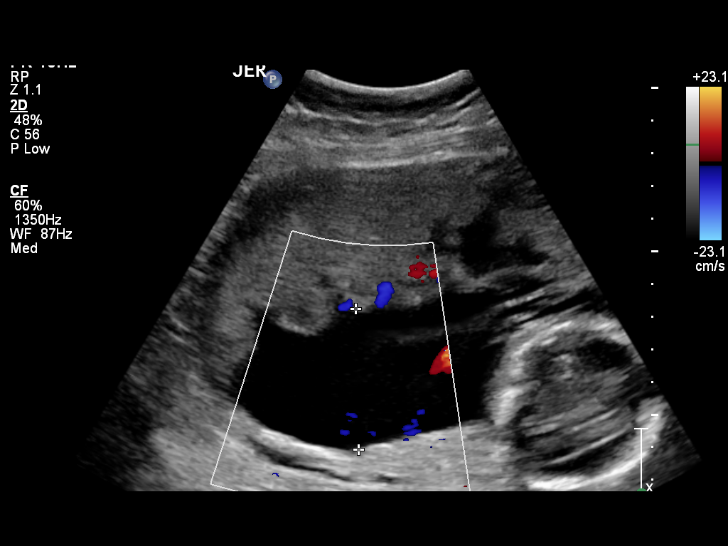
[im 13/66]
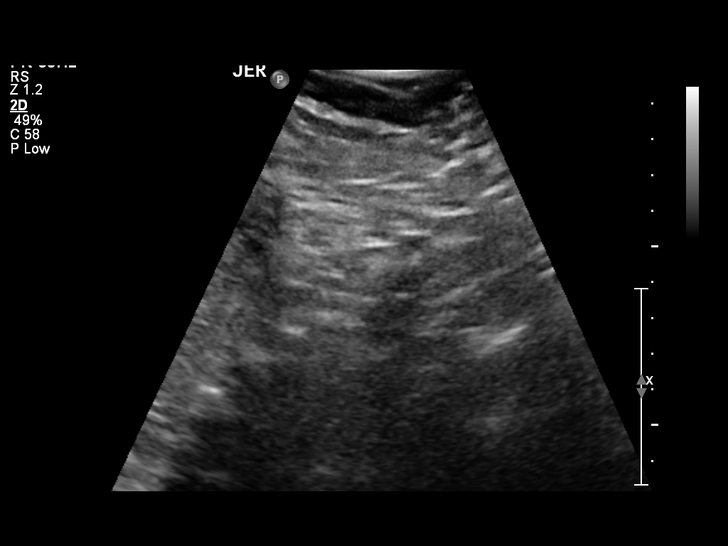
[im 20/66]
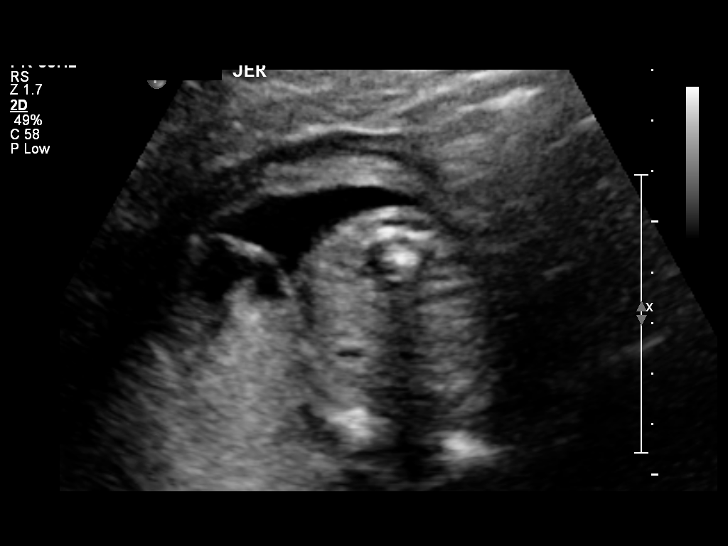
[im 25/66]
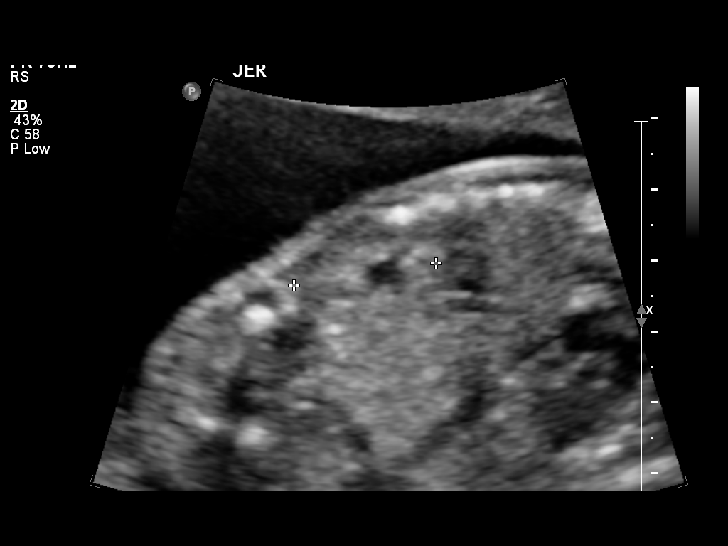
[im 29/66]
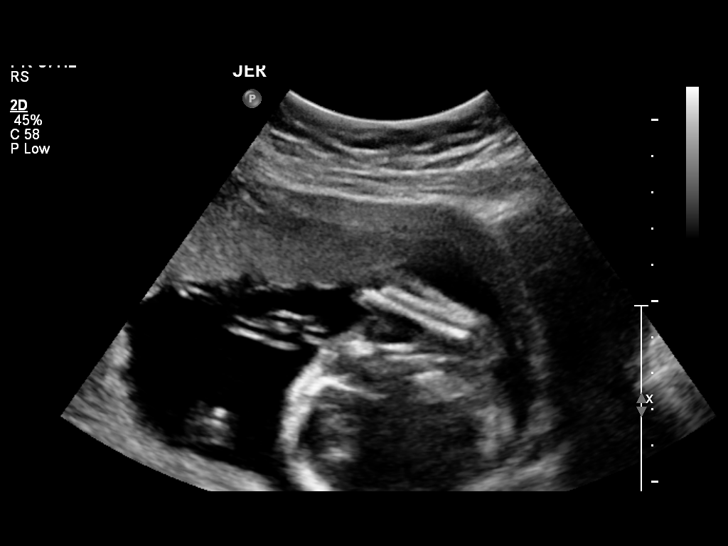
[im 37/66]
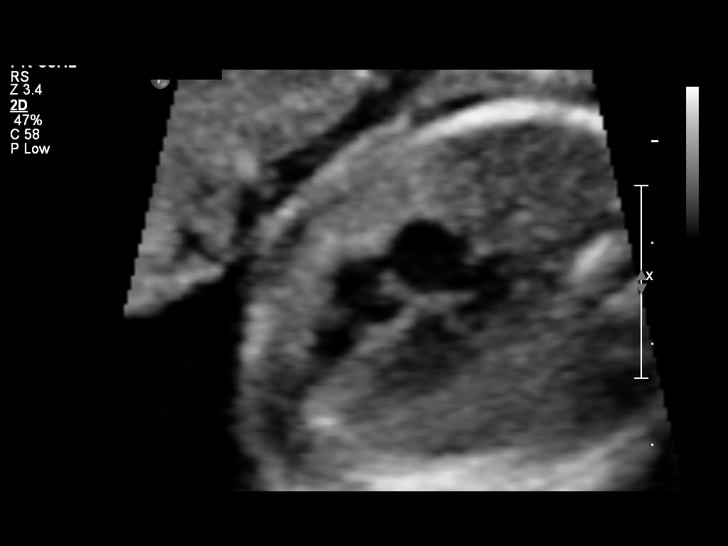
[im 41/66]
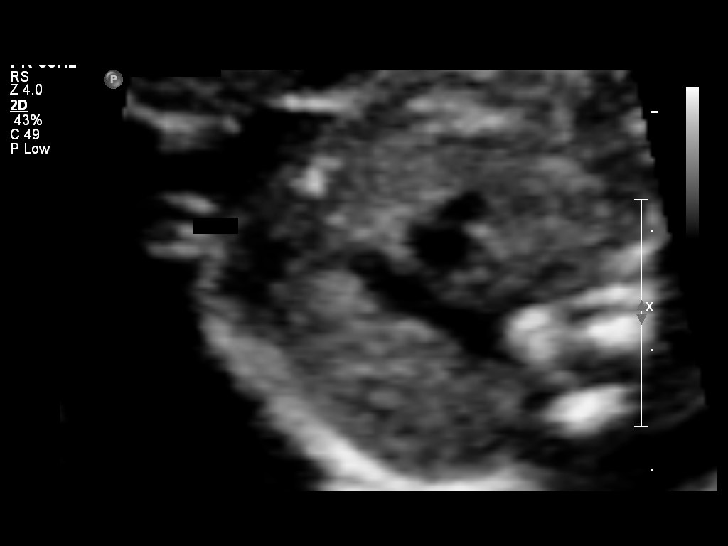
[im 46/66]
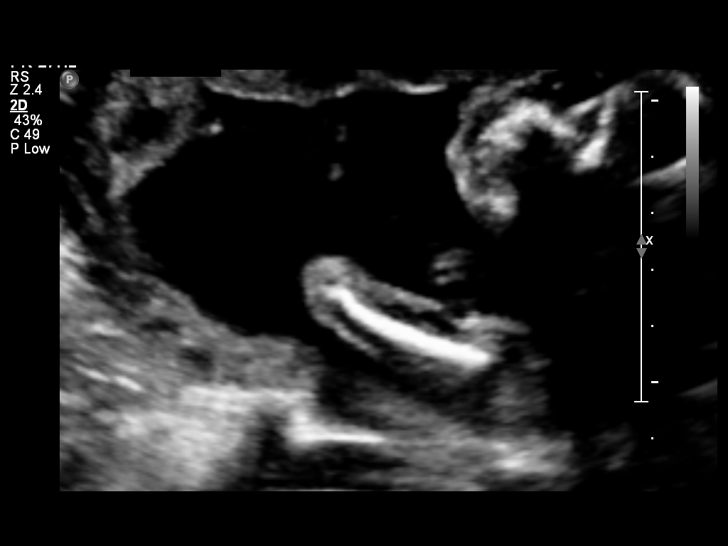
[im 53/66]
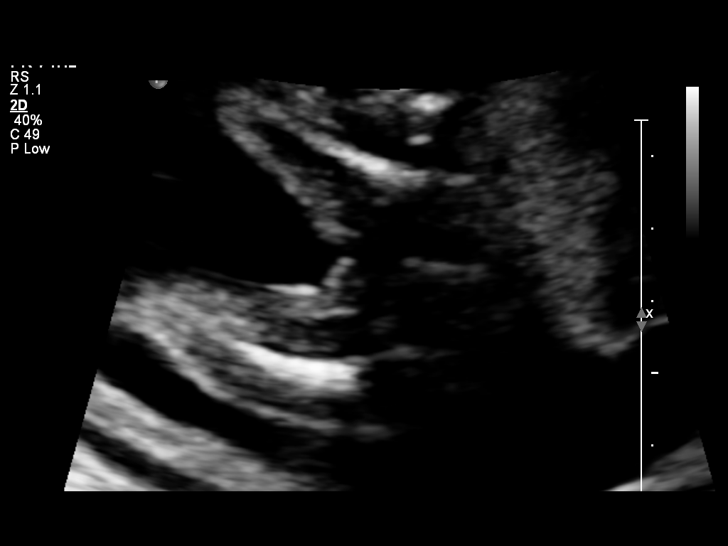
[im 58/66]
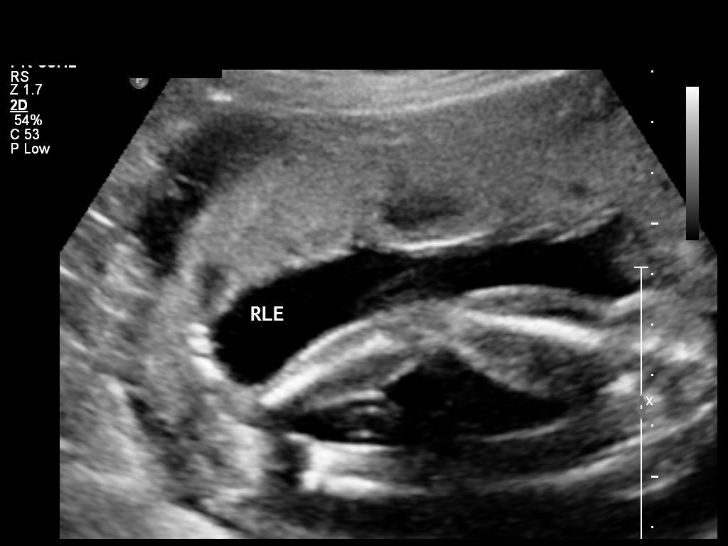
[im 63/66]
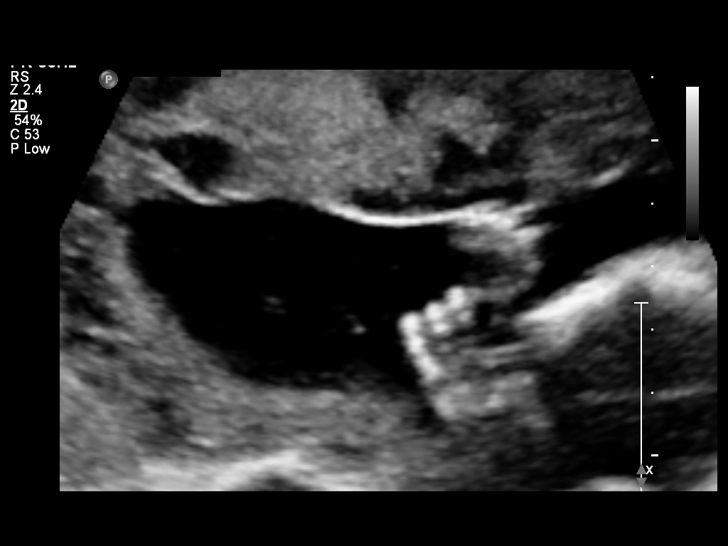

[12 of 28 positions shown; findings below may reference images not displayed]

OBSTETRICS REPORT
                      (Signed Final 10/16/2011 [DATE])

 Order#:         [id number]
Procedures

 US OB DETAIL + 14 WK                                  76811.0
Indications

 HIV: Asymptomatic                                     647.63 V08
 Detailed fetal anatomic survey
Fetal Evaluation

 Preg. Location:    Intrauterine
 Fetal Heart Rate:  146                         bpm
 Cardiac Activity:  Observed
 Presentation:      Cephalic
 Placenta:          Anterior, above cervical os
 P. Cord            Visualized, central
 Insertion:

 Amniotic Fluid
 AFI FV:      Subjectively within normal limits
                                             Larg Pckt:   4.29   cm
 RUQ:   4.29   cm
Biometry

 BPD:     48.1  mm    G. Age:   20w 4d                CI:        77.55   70 - 86
                                                      FL/HC:      17.9   16.8 -

 HC:     172.9  mm    G. Age:   19w 6d       19  %    HC/AC:      1.10   1.09 -

 AC:     156.8  mm    G. Age:   20w 6d       57  %    FL/BPD:
 FL:        31  mm    G. Age:   19w 4d       17  %    FL/AC:      19.8   20 - 24
 CER:     19.4  mm    G. Age:   18w 5d      < 5  %

 Est. FW:     341  gm    0 lb 12 oz      43  %
Gestational Age

 LMP:           13w 4d       Date:   07/13/11                 EDD:   04/18/12
 U/S Today:     20w 2d                                        EDD:   03/02/12
 Best:          20w 3d    Det. By:   U/S C R L   (08/28/11)   EDD:   03/01/12
Anatomy
 Cranium:           Appears normal      Aortic Arch:       Not well
                                                           visualized
 Fetal Cavum:       Appears normal      Ductal Arch:       Not well
                                                           visualized
 Ventricles:        Appears normal      Diaphragm:         Appears normal
 Choroid Plexus:    Appears normal      Stomach:           Appears
                                                           normal, left
                                                           sided
 Cerebellum:        Appears normal      Abdomen:           Appears normal
 Posterior Fossa:   Appears normal      Abdominal Wall:    Appears nml
                                                           (cord insert,
                                                           abd wall)
 Nuchal Fold:       Not applicable      Cord Vessels:      Appears normal
                    (>20 wks GA)                           (3 vessel cord)
 Face:              Appears normal      Kidneys:           Appear normal
                    (lips/profile/orbit
                    s)
 Heart:             Appears normal      Bladder:           Appears normal
                    (4 chamber &
                    axis)
 RVOT:              Appears normal      Spine:             Appears normal
 LVOT:              Appears normal      Limbs:             Appears normal
                                                           (hands, ankles,
                                                           feet)

 Other:     Heels and 5th digit visualized. Fetus appears to be a
            female.
Cervix Uterus Adnexa

 Cervical Length:   2.96      cm

 Cervix:       Normal appearance by transabdominal scan.
 Uterus:       No abnormality visualized.
 Cul De Sac:   No free fluid seen.

 Left Ovary:   Not visualized.
 Right Ovary:  Size(cm) L: 3.8 x W: 2.8 x H: 2.65  Volume(cc):
               Within normal limits.
 Adnexa:     No abnormality visualized.
Impression

 Single living IUP with US Gest. Age of 20w 3d, and EDD of
 03/01/2012.
 No fetal anatomic abnormality is identified.
 Normal amniotic fluid volume and cervical length.

## 2013-02-24 ENCOUNTER — Other Ambulatory Visit: Payer: Self-pay | Admitting: Licensed Clinical Social Worker

## 2013-02-24 DIAGNOSIS — B20 Human immunodeficiency virus [HIV] disease: Secondary | ICD-10-CM

## 2013-02-24 MED ORDER — EMTRICITAB-RILPIVIR-TENOFOV DF 200-25-300 MG PO TABS: 1.0000 | ORAL_TABLET | Freq: Every day | ORAL | Status: DC

## 2013-03-01 ENCOUNTER — Encounter (HOSPITAL_COMMUNITY): Payer: Self-pay | Admitting: Advanced Practice Midwife

## 2013-03-01 ENCOUNTER — Inpatient Hospital Stay (HOSPITAL_COMMUNITY): Payer: Self-pay

## 2013-03-01 ENCOUNTER — Inpatient Hospital Stay (HOSPITAL_COMMUNITY)
Admission: AD | Admit: 2013-03-01 | Discharge: 2013-03-01 | Disposition: A | Discharge status: Home / Self Care | Payer: Self-pay | Source: Ambulatory Visit | Attending: Obstetrics and Gynecology | Admitting: Obstetrics and Gynecology

## 2013-03-01 DIAGNOSIS — O2341 Unspecified infection of urinary tract in pregnancy, first trimester: Secondary | ICD-10-CM

## 2013-03-01 DIAGNOSIS — M545 Low back pain, unspecified: Secondary | ICD-10-CM | POA: Insufficient documentation

## 2013-03-01 DIAGNOSIS — O200 Threatened abortion: Secondary | ICD-10-CM

## 2013-03-01 LAB — URINE MICROSCOPIC-ADD ON

## 2013-03-01 LAB — HCG, QUANTITATIVE, PREGNANCY: hCG, Beta Chain, Quant, S: 90 m[IU]/mL — ABNORMAL HIGH (ref <5)

## 2013-03-01 LAB — URINALYSIS, ROUTINE W REFLEX MICROSCOPIC
Bilirubin Urine: NEGATIVE
Nitrite: NEGATIVE
Specific Gravity, Urine: 1.025 (ref 1.005–1.030)
pH: 6 (ref 5.0–8.0)

## 2013-03-01 LAB — POCT PREGNANCY, URINE: Preg Test, Ur: POSITIVE — AB

## 2013-03-01 LAB — CBC
Hemoglobin: 12.5 g/dL (ref 12.0–15.0)
MCHC: 34.6 g/dL (ref 30.0–36.0)
RBC: 4.27 MIL/uL (ref 3.87–5.11)
WBC: 5.9 10*3/uL (ref 4.0–10.5)

## 2013-03-01 LAB — WET PREP, GENITAL
Clue Cells Wet Prep HPF POC: NONE SEEN
Yeast Wet Prep HPF POC: NONE SEEN

## 2013-03-01 MED ORDER — NITROFURANTOIN MONOHYD MACRO 100 MG PO CAPS: 100.0000 mg | ORAL_CAPSULE | Freq: Two times a day (BID) | ORAL | Status: DC

## 2013-03-01 NOTE — MAU Note (Signed)
Patient states she had a positive home pregnancy test 5 days ago. Had vaginal bleeding this am that was heavier than at this time. Has a little back pain.

## 2013-03-01 NOTE — MAU Note (Signed)
Patient presents today with c/o of lower back pain and bleeding yesterday; however that of which has bothe stopped today. States passed "tissue like material".

## 2013-03-02 LAB — URINE CULTURE

## 2013-03-02 LAB — GC/CHLAMYDIA PROBE AMP
CT Probe RNA: NEGATIVE
GC Probe RNA: NEGATIVE

## 2013-03-03 ENCOUNTER — Inpatient Hospital Stay (HOSPITAL_COMMUNITY)
Admission: AD | Admit: 2013-03-03 | Discharge: 2013-03-03 | Disposition: A | Discharge status: Home / Self Care | Payer: Self-pay | Source: Ambulatory Visit | Attending: Obstetrics & Gynecology | Admitting: Obstetrics & Gynecology

## 2013-03-03 DIAGNOSIS — O039 Complete or unspecified spontaneous abortion without complication: Secondary | ICD-10-CM | POA: Insufficient documentation

## 2013-03-03 NOTE — MAU Provider Note (Signed)
Ms. Catherine Grimes is a 32 y.o. G2P1001 at [redacted]w[redacted]d who presents to MAU today for follow-up quant hCG. Patient continues to have miminal spotting. Denies pain.   BP 92/57  Pulse 69  Temp(Src) 97.1 F (36.2 C)  Resp 18  LMP 01/10/2013 GENERAL: Well-developed, well-nourished female in no acute distress.  HEENT: Normocephalic, atraumatic.   LUNGS: Effort normal HEART: Regular rate  SKIN: Warm, dry and without erythema PSYCH: Normal mood and affect  Results for orders placed during the hospital encounter of 03/03/13 (from the past 24 hour(s))  HCG, QUANTITATIVE, PREGNANCY     Status: Abnormal   Collection Time    03/03/13  9:38 AM      Result Value Range   hCG, Beta Chain, Quant, S 15 (*) <5 mIU/mL    MDM Discussed results with patient and [name], interpreter Comfort care packet given  A: SAB  P: Discharge home Bleeding precautions discussed Patient referred to [facility] for follow-up in ~ 2 weeks Patient may return to MAU as needed or if her condition were to change or worsen  Freddi Starr, PA-C 03/03/2013 1:59 PM

## 2013-03-03 NOTE — MAU Note (Signed)
Had passed clots and ? Sac prior to last visit.  Bleeding has slowed a lot since then.

## 2013-03-03 NOTE — MAU Provider Note (Signed)
History     CSN: [id number]  Arrival date and time: 03/01/13 1013   First Provider Initiated Contact with Patient 03/01/13 1105     Chief Complaint  Patient presents with  . Possible Pregnancy  . Vaginal Bleeding  . Back Pain   HPI Comments: Ms. Catherine Grimes presents for vaginal bleeding. She is HIV positive. She is 7weeks and 1 day pregnant by last menstrual period. She is currently in no pain or distress. This episode began last night when she first noticed some vaginal bleeding. Later that evening she noted the passage of tissue, which she described as "plastic bag," in consistency.  She reports pain with the episode, which was 7/10 and qualified as menstrual pain. She also describe some "mild burning," with urination.  She denies a history of miscarriage.  She denies nausea and vomiting, fever and chills.    Possible Pregnancy This is a new problem. The current episode started yesterday. The problem has been gradually improving. Associated symptoms include urinary symptoms. Pertinent negatives include no abdominal pain, anorexia, chills, fatigue, fever, headaches, joint swelling, nausea, rash, vomiting or weakness. Nothing aggravates the symptoms. She has tried nothing for the symptoms.  Vaginal Bleeding Associated symptoms include back pain, dysuria and frequency. Pertinent negatives include no abdominal pain, anorexia, chills, constipation, diarrhea, fever, headaches, nausea, rash or vomiting.  Back Pain Associated symptoms include dysuria. Pertinent negatives include no abdominal pain, fever, headaches or weakness.    OB History   Grav Para Term Preterm Abortions TAB SAB Ect Mult Living   2 1 1       1       Past Medical History  Diagnosis Date  . HIV infection   . Infection     HIV    Past Surgical History  Procedure Laterality Date  . No past surgeries      Family History  Problem Relation Age of Onset  . Diabetes Mother     History  Substance Use Topics  .  Smoking status: Never Smoker   . Smokeless tobacco: Never Used  . Alcohol Use: No    Allergies: No Known Allergies  No prescriptions prior to admission    Review of Systems  Constitutional: Negative.  Negative for fever, chills and fatigue.  Eyes: Negative.   Gastrointestinal: Negative for nausea, vomiting, abdominal pain, diarrhea, constipation and anorexia.  Genitourinary: Positive for dysuria, frequency and vaginal bleeding.  Musculoskeletal: Positive for back pain. Negative for joint swelling.  Skin: Negative.  Negative for rash.  Neurological: Negative.  Negative for weakness and headaches.   Physical Exam   Blood pressure 111/74, pulse 66, temperature 98.9 F (37.2 C), temperature source Oral, resp. rate 16, height 5' 0.5" (1.537 m), weight 70.398 kg (155 lb 3.2 oz), last menstrual period 01/10/2013, SpO2 100.00%.  Physical Exam  Constitutional: She is oriented to person, place, and time. She appears well-developed and well-nourished.  Non-toxic appearance. She does not have a sickly appearance. She does not appear ill. No distress.  GI: She exhibits no distension. There is no tenderness.  Genitourinary: There is no rash, tenderness, lesion or injury on the right labia. There is no rash, tenderness, lesion or injury on the left labia. There is tenderness ( Mild cervical motion tenderness) and bleeding ( At the site of cervical os) around the vagina. No erythema around the vagina. No foreign body around the vagina. No signs of injury around the vagina.  Neurological: She is alert and oriented to person, place, and time.  Skin: Skin is warm and dry. No rash noted. She is not diaphoretic. No erythema.  Psychiatric: She has a normal mood and affect. Her behavior is normal. Judgment and thought content normal.    MAU Course  Procedures URINALYSIS, ROUTINE W REFLEX MICROSCOPIC   Collection Time    03/01/13 10:30 AM      Result Value Range   Color, Urine YELLOW  YELLOW    APPearance CLEAR  CLEAR   Specific Gravity, Urine 1.025  1.005 - 1.030   pH 6.0  5.0 - 8.0   Glucose, UA NEGATIVE  NEGATIVE mg/dL   Hgb urine dipstick LARGE (*) NEGATIVE   Bilirubin Urine NEGATIVE  NEGATIVE   Ketones, ur NEGATIVE  NEGATIVE mg/dL   Protein, ur NEGATIVE  NEGATIVE mg/dL   Urobilinogen, UA 0.2  0.0 - 1.0 mg/dL   Nitrite NEGATIVE  NEGATIVE   Leukocytes, UA SMALL (*) NEGATIVE  URINE MICROSCOPIC-ADD ON   Collection Time    03/01/13 10:30 AM      Result Value Range   Squamous Epithelial / LPF FEW (*) RARE   WBC, UA 0-2  <3 WBC/hpf   RBC / HPF 7-10  <3 RBC/hpf   Bacteria, UA FEW (*) RARE   Urine-Other MUCOUS PRESENT    POCT PREGNANCY, URINE   Collection Time    03/01/13 10:36 AM      Result Value Range   Preg Test, Ur POSITIVE (*) NEGATIVE  WET PREP, GENITAL   Collection Time    03/01/13 11:30 AM      Result Value Range   Yeast Wet Prep HPF POC NONE SEEN  NONE SEEN   Trich, Wet Prep NONE SEEN  NONE SEEN   Clue Cells Wet Prep HPF POC NONE SEEN  NONE SEEN   WBC, Wet Prep HPF POC FEW (*) NONE SEEN  HCG, QUANTITATIVE, PREGNANCY   Collection Time    03/01/13 11:45 AM      Result Value Range   hCG, Beta Chain, Quant, S 90 (*) <5 mIU/mL  CBC   Collection Time    03/01/13 11:45 AM      Result Value Range   WBC 5.9  4.0 - 10.5 K/uL   RBC 4.27  3.87 - 5.11 MIL/uL   Hemoglobin 12.5  12.0 - 15.0 g/dL   HCT 40.9  81.1 - 91.4 %   MCV 84.5  78.0 - 100.0 fL   MCH 29.3  26.0 - 34.0 pg   MCHC 34.6  30.0 - 36.0 g/dL   RDW 78.2  95.6 - 21.3 %   Platelets 233  150 - 400 K/uL    Assessment and Plan   1. Threatened miscarriage in early pregnancy   2. UTI in pregnancy, antepartum, first trimester    Plan: D/C home in stable conditions. SAB and ectopic precautions.   Medication List         Emtricitab-Rilpivir-Tenofovir 200-25-300 MG Tabs  Commonly known as:  COMPLERA  Take 1 tablet by mouth daily.     nitrofurantoin (macrocrystal-monohydrate) 100 MG capsule   Commonly known as:  MACROBID  Take 1 capsule (100 mg total) by mouth 2 (two) times daily.       Follow-up Information   Follow up with [facility] [place] [facility] In 2 days. (for bloodwork or as needed if symptoms worsen)    Contact information:   [address] [id number] [place] [address] [phone]     I was present for  the exam and agree with above.  Dessire Grimes 03/03/2013, 7:12 AM

## 2013-03-03 NOTE — MAU Provider Note (Signed)
Attestation of Attending Supervision of Advanced Practitioner (CNM/NP): Evaluation and management procedures were performed by the Advanced Practitioner under my supervision and collaboration.  I have reviewed the Advanced Practitioner's note and chart, and I agree with the management and plan.  Kemani Demarais 03/03/2013 10:58 AM   

## 2013-03-15 ENCOUNTER — Ambulatory Visit: Payer: Self-pay

## 2013-03-17 ENCOUNTER — Encounter: Payer: Self-pay | Admitting: Family Medicine

## 2013-04-07 ENCOUNTER — Ambulatory Visit: Payer: Self-pay

## 2013-04-07 ENCOUNTER — Other Ambulatory Visit (INDEPENDENT_AMBULATORY_CARE_PROVIDER_SITE_OTHER): Payer: Self-pay

## 2013-04-07 DIAGNOSIS — Z79899 Other long term (current) drug therapy: Secondary | ICD-10-CM

## 2013-04-07 DIAGNOSIS — B20 Human immunodeficiency virus [HIV] disease: Secondary | ICD-10-CM

## 2013-04-07 LAB — LIPID PANEL
Cholesterol: 109 mg/dL (ref 0–200)
Total CHOL/HDL Ratio: 3 Ratio
Triglycerides: 126 mg/dL (ref <150)
VLDL: 25 mg/dL (ref 0–40)

## 2013-04-07 LAB — COMPREHENSIVE METABOLIC PANEL
ALT: 63 U/L — ABNORMAL HIGH (ref 0–35)
CO2: 27 mEq/L (ref 19–32)
Creat: 0.79 mg/dL (ref 0.50–1.10)
Glucose, Bld: 88 mg/dL (ref 70–99)
Total Bilirubin: 0.4 mg/dL (ref 0.3–1.2)

## 2013-04-07 LAB — CBC
MCH: 29.6 pg (ref 26.0–34.0)
MCV: 83.6 fL (ref 78.0–100.0)
Platelets: 273 10*3/uL (ref 150–400)
RBC: 4.33 MIL/uL (ref 3.87–5.11)

## 2013-04-12 ENCOUNTER — Ambulatory Visit: Payer: Self-pay

## 2013-04-16 ENCOUNTER — Ambulatory Visit (INDEPENDENT_AMBULATORY_CARE_PROVIDER_SITE_OTHER): Payer: Self-pay | Admitting: Obstetrics & Gynecology

## 2013-04-16 ENCOUNTER — Encounter: Payer: Self-pay | Admitting: Obstetrics & Gynecology

## 2013-04-16 VITALS — BP 87/48 | HR 62 | Temp 98.4°F | Wt 153.1 lb

## 2013-04-16 DIAGNOSIS — O039 Complete or unspecified spontaneous abortion without complication: Secondary | ICD-10-CM

## 2013-04-16 NOTE — Progress Notes (Signed)
  Subjective:    Patient ID: Catherine Grimes, female    DOB: 18-Dec-1980, 32 y.o.   MRN: [id number]  HPI  32 you G2P1011.  Pt was seen in the MAU for spontaneous AB.  She reports that she bled about 3 days and stopped.  She denies bleeding or cramping at present.  Her last HCG was 15.     She is using condoms for contraception.      Review of Systems     Objective:   Physical Exam BP 87/48  Pulse 62  Temp(Src) 98.4 F (36.9 C) (Oral)  Wt 153 lb 1.6 oz (69.446 kg)  BMI 29.4 kg/m2  LMP 01/10/2013  Breastfeeding? Unknown Exam deferred        Assessment & Plan:  SAB- no problems Pt will use condoms for birth control rec no pregnancies for 3 months

## 2013-04-16 NOTE — Patient Instructions (Addendum)
Aborto espontneo  (Miscarriage) El aborto espontneo es la prdida de un beb que no ha nacido (feto) antes de la semana 20 del Media planner. La mayor parte de estos abortos ocurre en los primeros 3 meses. En algunos casos ocurre antes de que la mujer sepa que est [place]. Tambin se denomina "aborto espontneo" o "prdida prematura del embarazo". El aborto espontneo puede ser Ardelia Mems experiencia que afecte emocionalmente a Geologist, engineering. Converse con su mdico si tiene dudas, cmo es el proceso de [place], y sobre planes futuros de Media planner.  CAUSAS   Algunos problemas cromosmicos pueden hacer imposible que el beb se desarrolle normalmente. Los problemas con los genes o cromosomas del beb son generalmente el resultado de errores que se producen, por casualidad, cuando el embrin se divide y crece. Estos problemas no se heredan de los [place].  Infeccin en el cuello del tero.   Problemas hormonales.   Problemas en el cuello del tero, como tener un tero incompetente. Esto ocurre cuando los tejidos no son lo suficientemente fuertes como para Risk manager.   Problemas del tero, como un tero con forma anormal, los fibromas o anormalidades congnitas.   Ciertas enfermedades crnicas.   No fume, no beba alcohol, ni consuma drogas.   Traumatismos  A veces, la causa es desconocida.  SNTOMAS   Sangrado o manchado vaginal, con o sin clicos o dolor.  Dolor o clicos en el abdomen o en la cintura.  Eliminacin de lquido, tejidos o cogulos grandes por la vagina. DIAGNSTICO  El [employer] har un examen fsico. Tambin le indicar una ecografa para confirmar el aborto. Es posible que se realicen anlisis de [place].  TRATAMIENTO   En algunos casos el tratamiento no es necesario, si se eliminan naturalmente todos los tejidos embrionarios que se encontraban en el tero. Si el feto o la placenta quedan dentro del tero (aborto incompleto), pueden infectarse, los tejidos que quedan  pueden infectarse y deben retirarse. Generalmente se realiza un procedimiento de dilatacin y curetaje (D y C). Durante el procedimiento de dilatacin y curetaje, el cuello del tero se abre (dilata) y se retira cualquier resto de tejido fetal o placentario del tero.  Si hay una infeccin, le recetarn antibiticos. Podrn recetarle otros medicamentos para reducir el tamao del tero (contraerlo) si hay una mucho sangrado.  Si su sangre es Rh negativa y su beb es Rh positivo, usted necesitar la inyeccin de inmunoglobulina Rh. Esta inyeccin proteger a los futuros bebs de tener problemas de compatibilidad Rh en futuros embarazos. INSTRUCCIONES PARA EL CUIDADO EN EL HOGAR   El mdico le indicar reposo en cama o le permitir Automotive engineer. Vuelva a la actividad lentamente o segn las indicaciones de su mdico.  Pdale a alguien que la ayude con las responsabilidades familiares y del hogar durante este tiempo.   Lleve un registro de la cantidad y la saturacin de las toallas higinicas que Medical laboratory scientific officer. Anote esta informacin   No use tampones. No No se haga duchas vaginales ni tenga relaciones sexuales hasta que el mdico la autorice.   Slo tome medicamentos de venta libre o recetados para Glass blower/designer o Health and safety inspector, segn las indicaciones de su mdico.   No tome aspirina. La aspirina puede ocasionar hemorragias.   Concurra puntualmente a las citas de control con el mdico.   Si usted o su pareja tienen dificultades con el duelo, hable con su mdico para buscar la ayuda psicolgica que los ayude a enfrentar la prdida  del embarazo. Permtase el tiempo suficiente de duelo antes de quedar embarazada nuevamente.  SOLICITE ATENCIN MDICA DE INMEDIATO SI:   Siente calambres intensos o dolor en la espalda o en el abdomen.  Tiene fiebre.  Elimina grandes cogulos de [place] (del tamao de una nuez o ms) o tejidos por la vagina. Guarde lo que ha eliminado para  que su mdico lo examine.   La hemorragia aumenta.   Brett Fairy secrecin vaginal espesa y con mal olor.  Se siente mareada, dbil, o se desmaya.   Siente escalofros.  ASEGRESE DE QUE:   Comprende estas instrucciones.  Controlar su enfermedad.  Solicitar ayuda de inmediato si no mejora o si empeora. Document Released: 04/24/2005 Document Revised: 01/14/2012 [facility] Patient Information 2014 [place], [place].

## 2013-04-19 ENCOUNTER — Ambulatory Visit (INDEPENDENT_AMBULATORY_CARE_PROVIDER_SITE_OTHER): Payer: Self-pay | Admitting: *Deleted

## 2013-04-19 DIAGNOSIS — Z124 Encounter for screening for malignant neoplasm of cervix: Secondary | ICD-10-CM

## 2013-04-19 NOTE — Progress Notes (Signed)
  Subjective:     My Rinke is a 32 y.o. woman who comes in today for a  pap smear only.  Previous abnormal Pap smear: no. Contraception:   Condoms    Objective:    LMP 01/10/2013  SAB 03/03/13 per [facility]U. Pelvic Exam:  Pap smear obtained.   Assessment:    Screening pap smear.   Plan:    Follow up in one year, or as indicated by Pap results.

## 2013-04-19 NOTE — Patient Instructions (Addendum)
  Sus resultados estarn listos en [employer]. Les voy a enviar a usted. Gracias por venir al [employer] para su cuidado.   Angelique Blonder, RN

## 2013-04-21 ENCOUNTER — Other Ambulatory Visit: Payer: Self-pay | Admitting: *Deleted

## 2013-04-21 ENCOUNTER — Encounter: Payer: Self-pay | Admitting: Infectious Diseases

## 2013-04-21 ENCOUNTER — Ambulatory Visit (INDEPENDENT_AMBULATORY_CARE_PROVIDER_SITE_OTHER): Payer: Self-pay | Admitting: Infectious Diseases

## 2013-04-21 VITALS — BP 109/62 | HR 67 | Temp 98.4°F | Ht 62.0 in | Wt 155.0 lb

## 2013-04-21 DIAGNOSIS — K089 Disorder of teeth and supporting structures, unspecified: Secondary | ICD-10-CM

## 2013-04-21 DIAGNOSIS — Z79899 Other long term (current) drug therapy: Secondary | ICD-10-CM

## 2013-04-21 DIAGNOSIS — B20 Human immunodeficiency virus [HIV] disease: Secondary | ICD-10-CM

## 2013-04-21 DIAGNOSIS — Z23 Encounter for immunization: Secondary | ICD-10-CM

## 2013-04-21 DIAGNOSIS — K0889 Other specified disorders of teeth and supporting structures: Secondary | ICD-10-CM

## 2013-04-21 DIAGNOSIS — Z113 Encounter for screening for infections with a predominantly sexual mode of transmission: Secondary | ICD-10-CM

## 2013-04-21 MED ORDER — AMOXICILLIN-POT CLAVULANATE 875-125 MG PO TABS: 1.0000 | ORAL_TABLET | Freq: Two times a day (BID) | ORAL | Status: DC

## 2013-04-21 NOTE — Progress Notes (Signed)
  Subjective:    Patient ID: Catherine Grimes, female    DOB: 04-22-1981, 32 y.o.   MRN: [id number]  HPI 32 yo F with hx of HIV+, on complera. No missed doses. Had miscarriage 03-03-13. Has been feeling better since (initial grief as expected). Her 32 yr old is doing well.  Prev seen with poor dentition, feels like area in her mouth is getting larger. Has not seen dental yet.   HIV 1 RNA Quant (copies/mL)  Date Value  04/07/2013 <20   10/05/2012 <20   04/14/2012 <20      CD4 T Cell Abs (/uL)  Date Value  04/07/2013 960   10/05/2012 830   04/14/2012 710       Review of Systems  Constitutional: Negative for fever, chills and unexpected weight change.  Gastrointestinal: Negative for diarrhea and constipation.  Genitourinary: Negative for dyspareunia.  not having periods. Using condoms.      Objective:   Physical Exam  Constitutional: She appears well-developed and well-nourished.  HENT:  Mouth/Throat: No oropharyngeal exudate.    Eyes: EOM are normal. Pupils are equal, round, and reactive to light.  Neck: Neck supple.  Cardiovascular: Normal rate, regular rhythm and normal heart sounds.   Pulmonary/Chest: Effort normal and breath sounds normal.  Abdominal: Soft. Bowel sounds are normal. There is no tenderness.  Musculoskeletal: She exhibits no edema.  Lymphadenopathy:    She has no cervical adenopathy.          Assessment & Plan:

## 2013-04-21 NOTE — Assessment & Plan Note (Signed)
She is doing very well. Will continue her current ART. She gets flu shot today, given condoms. See her back in 6 months.

## 2013-04-21 NOTE — Assessment & Plan Note (Signed)
Will give her short course of augmentin. She needs dental.

## 2013-04-22 ENCOUNTER — Encounter: Payer: Self-pay | Admitting: *Deleted

## 2013-05-04 IMAGING — US US OB FOLLOW-UP
1 series · 12 of 28 positions shown · non-contrast
Comparison: none

[Series 1: us ob follow up · 12 of 33 slices shown]
[im 2/33]
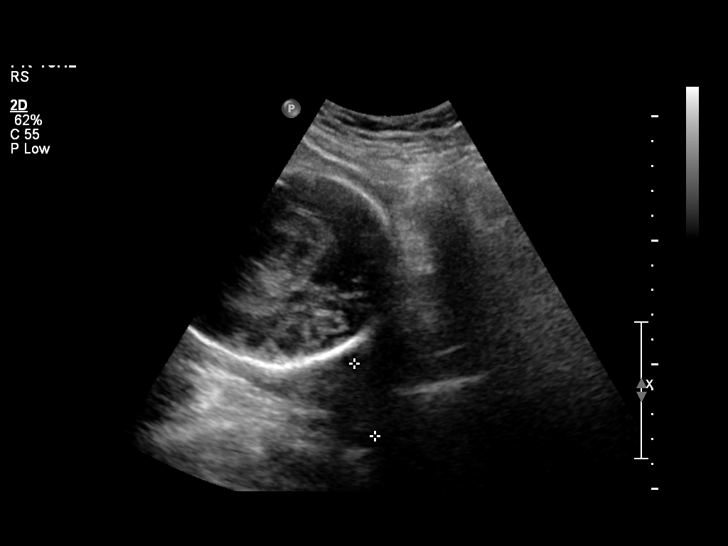
[im 4/33]
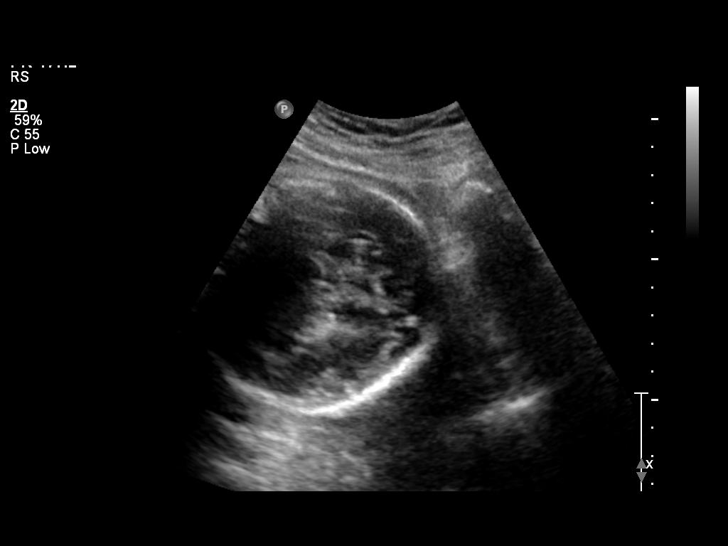
[im 6/33]
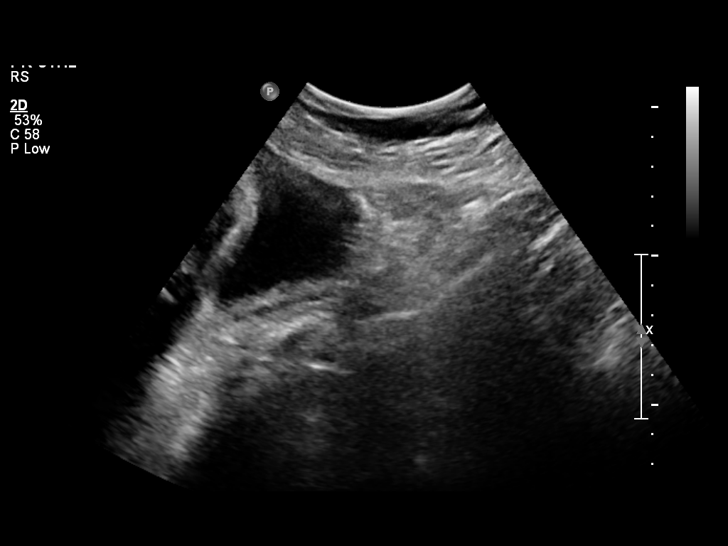
[im 10/33]
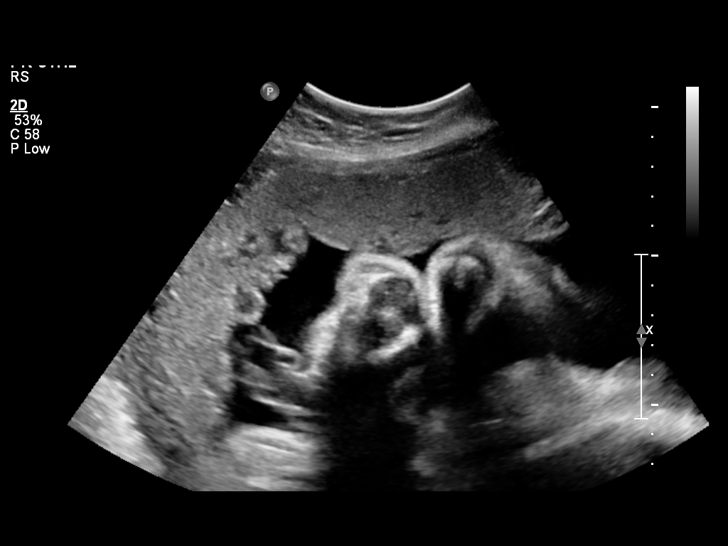
[im 12/33]
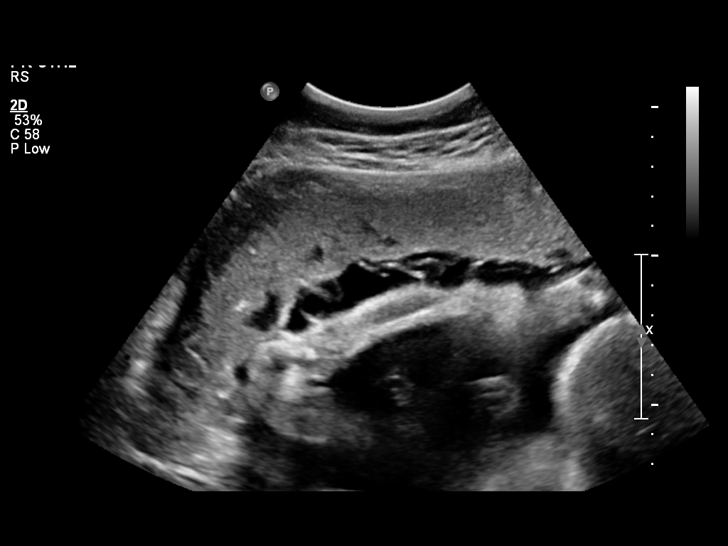
[im 15/33]
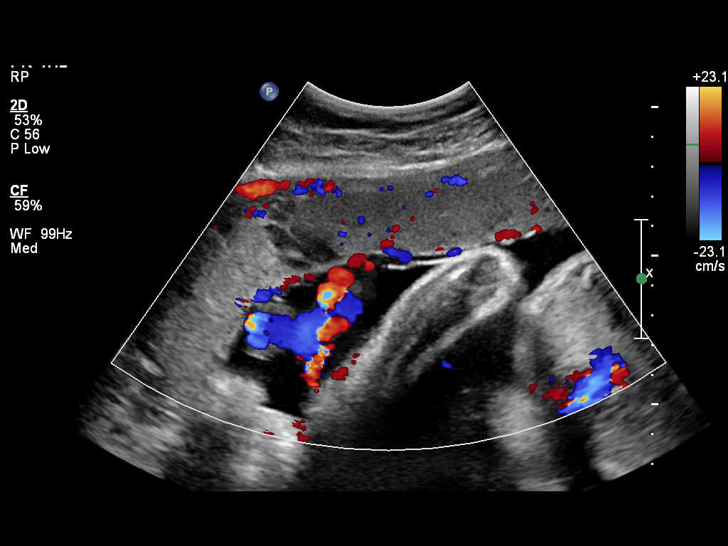
[im 18/33]
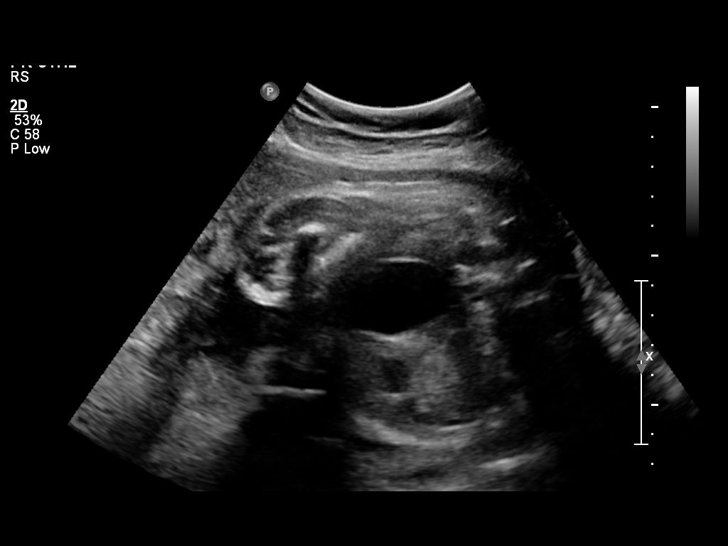
[im 21/33]
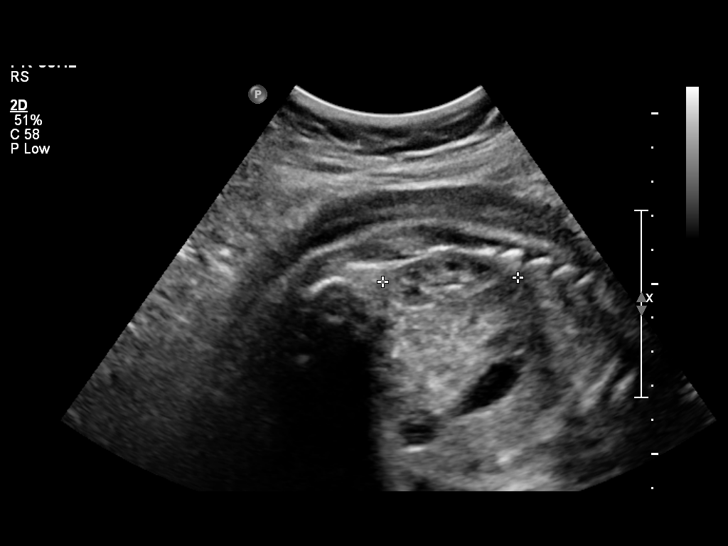
[im 23/33]
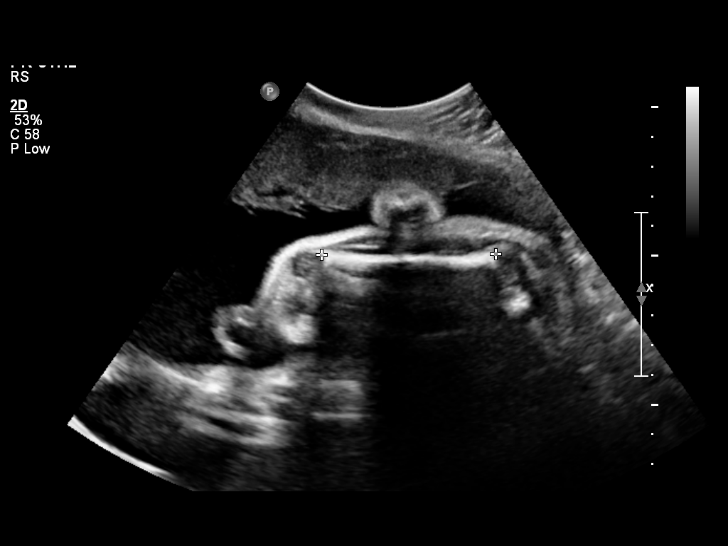
[im 27/33]
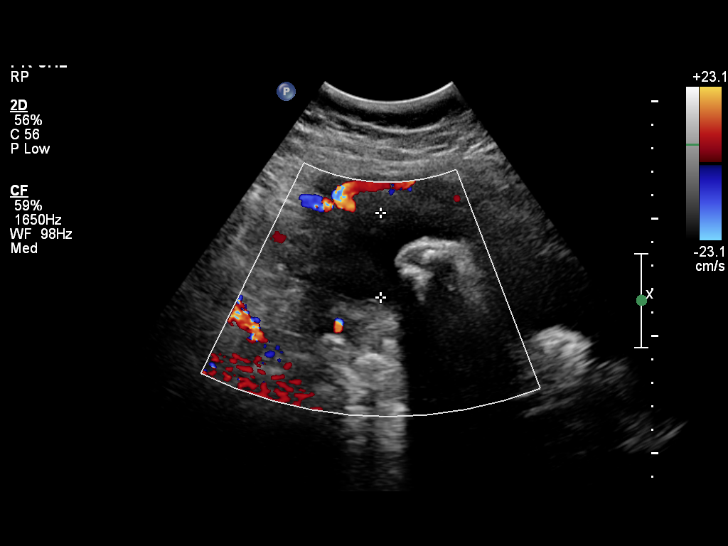
[im 29/33]
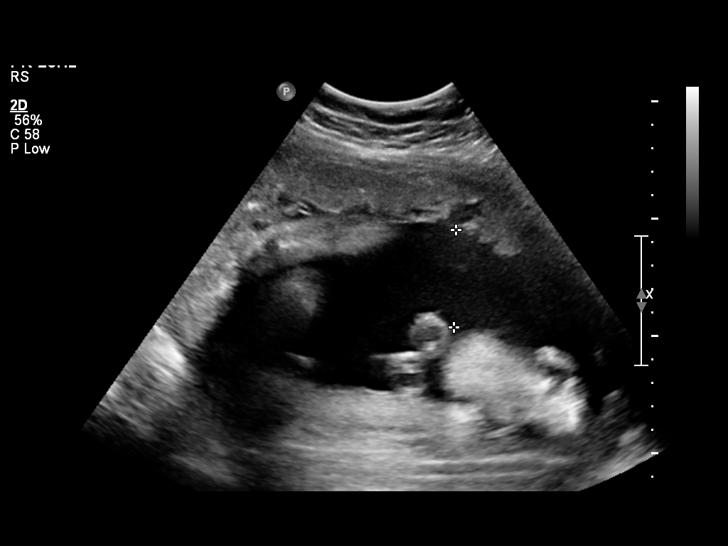
[im 31/33]
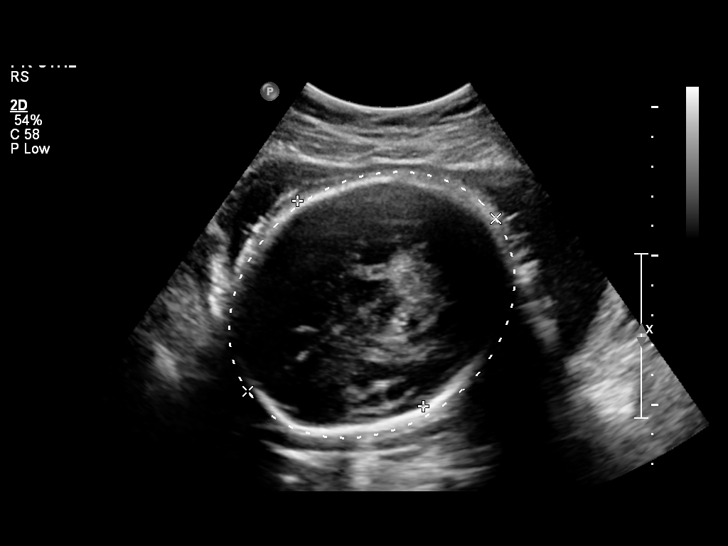

[12 of 28 positions shown; findings below may reference images not displayed]

OBSTETRICS REPORT
                      (Signed Final 01/09/2012 [DATE])

 Order#:         [id number]
Procedures

 US OB FOLLOW UP                                       76816.1
Indications

 HIV: Asymptomatic                                     647.63 V08
 Assess Fetal Growth / Estimated Fetal Weight
Fetal Evaluation

 Fetal Heart Rate:  156                         bpm
 Cardiac Activity:  Observed
 Presentation:      Cephalic
 Placenta:          Anterior, above cervical os
 P. Cord            Previously Visualized
 Insertion:

 Amniotic Fluid
 AFI FV:      Subjectively within normal limits
 AFI Sum:     18.84   cm      70   %Tile     Larg Pckt:     5.9  cm
 RUQ:   4.14   cm    RLQ:    3.6    cm    LUQ:   5.2     cm   LLQ:    5.9    cm
Biometry

 BPD:     80.7  mm    G. Age:   32w 3d                CI:        77.44   70 - 86
                                                      FL/HC:      20.0   19.9 -

 HC:     290.3  mm    G. Age:   32w 0d        8  %    HC/AC:      0.99   0.96 -

 AC:     294.7  mm    G. Age:   33w 3d       75  %    FL/BPD:     72.1   71 - 87
 FL:      58.2  mm    G. Age:   30w 3d      < 3  %    FL/AC:      19.7   20 - 24

 Est. FW:    6408  gm      4 lb 5 oz     53  %
Gestational Age

 LMP:           25w 5d       Date:   07/13/11                 EDD:   04/18/12
 U/S Today:     32w 1d                                        EDD:   03/04/12
 Best:          32w 4d    Det. By:   U/S C R L   (08/28/11)   EDD:   03/01/12
Anatomy

 Cranium:           Previously seen     Aortic Arch:       Not well
                                                           visualized
 Fetal Cavum:       Previously seen     Ductal Arch:       Not well
                                                           visualized
 Ventricles:        Appears normal      Diaphragm:         Previously seen
 Choroid Plexus:    Previously seen     Stomach:           Appears
                                                           normal, left
                                                           sided
 Cerebellum:        Previously seen     Abdomen:           Appears normal
 Posterior Fossa:   Previously seen     Abdominal Wall:    Previously seen
 Nuchal Fold:       Not applicable      Cord Vessels:      Previously seen
                    (>20 wks GA)
 Face:              Previously seen     Kidneys:           Appear normal
 Heart:             Previously seen     Bladder:           Appears normal
 RVOT:              Previously seen     Spine:             Previously seen
 LVOT:              Previously seen     Limbs:             Previously seen

 Other:     Heels and 5th digit previously visualized. Fetus appears
            to be a female previously visualized.
Cervix Uterus Adnexa

 Cervical Length:   3         cm

 Cervix:       Closed.

 Adnexa:     No abnormality visualized.
Impression

 Single live IUP in cephalic presentation.  Concordant
 measurements/assigned GA by US.
 No late-developing anomaly in visualized structures above.

## 2013-05-24 ENCOUNTER — Other Ambulatory Visit: Payer: Self-pay | Admitting: *Deleted

## 2013-05-24 DIAGNOSIS — B20 Human immunodeficiency virus [HIV] disease: Secondary | ICD-10-CM

## 2013-05-24 MED ORDER — EMTRICITAB-RILPIVIR-TENOFOV DF 200-25-300 MG PO TABS: 1.0000 | ORAL_TABLET | Freq: Every day | ORAL | Status: DC

## 2013-06-08 IMAGING — US US OB FOLLOW-UP
1 series · 12 of 28 positions shown · non-contrast
Comparison: none

[Series 1: us ob follow up · 55 acquisitions, 12 frames shown]
[im 3/55]
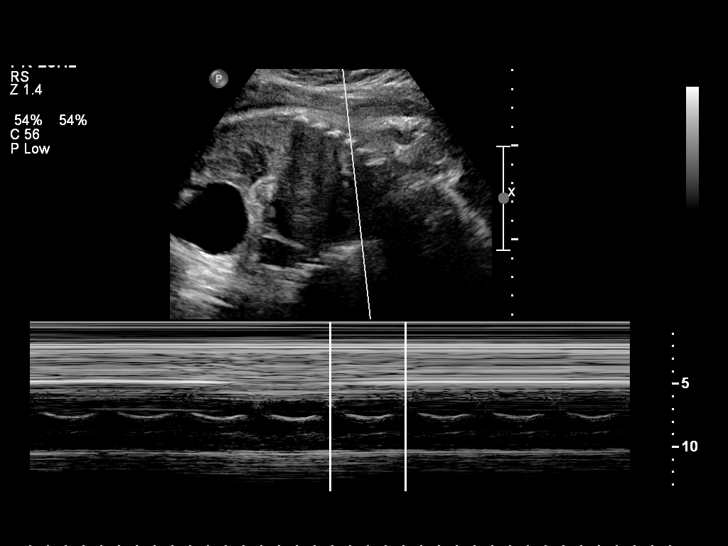
[im 7/55]
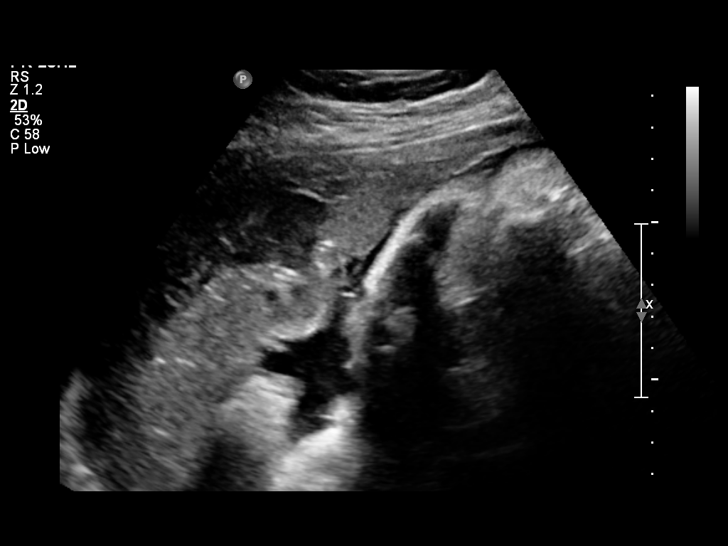
[im 11/55]
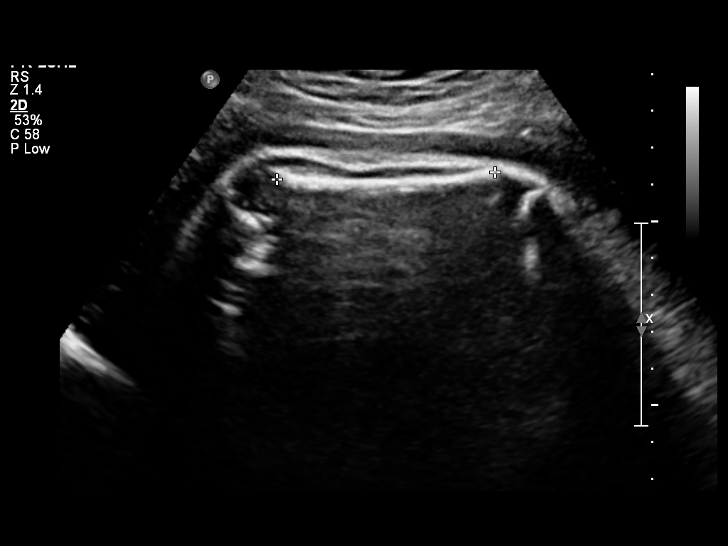
[im 17/55]
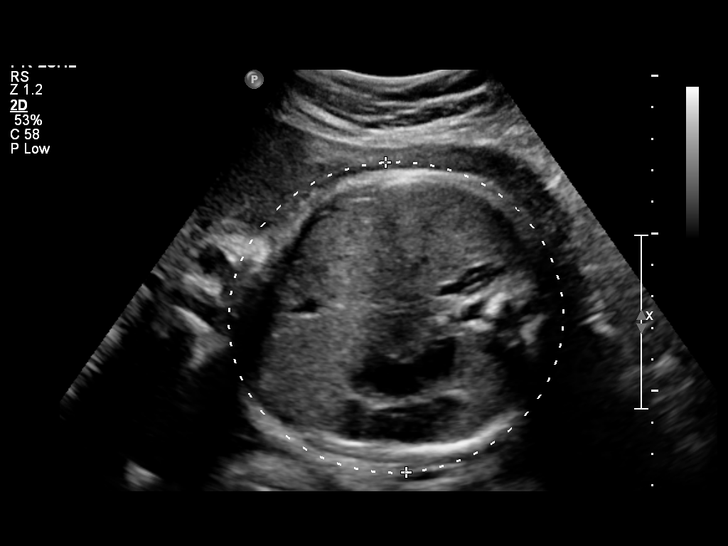
[im 21/55]
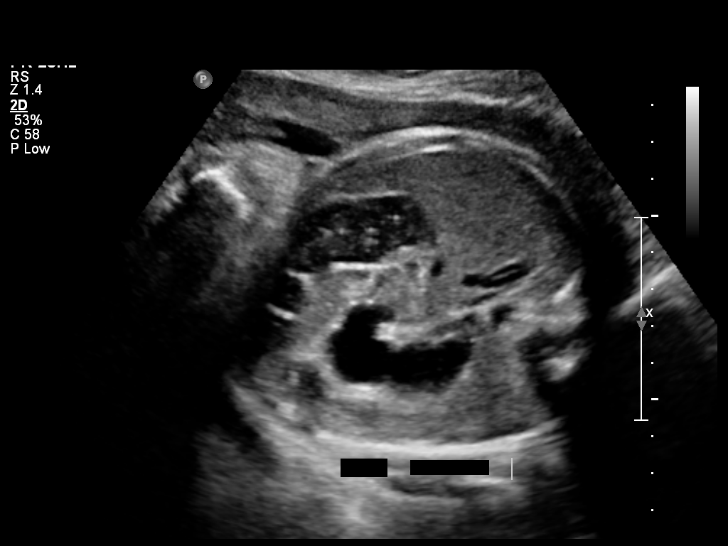
[im 25/55]
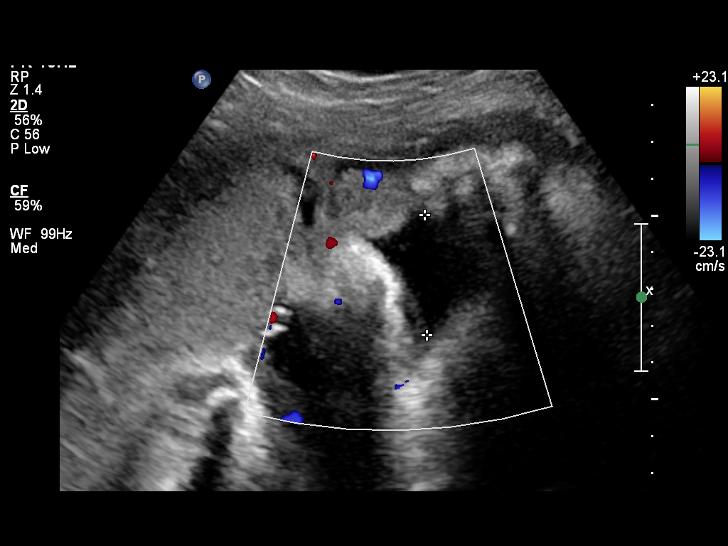
[im 31/55]
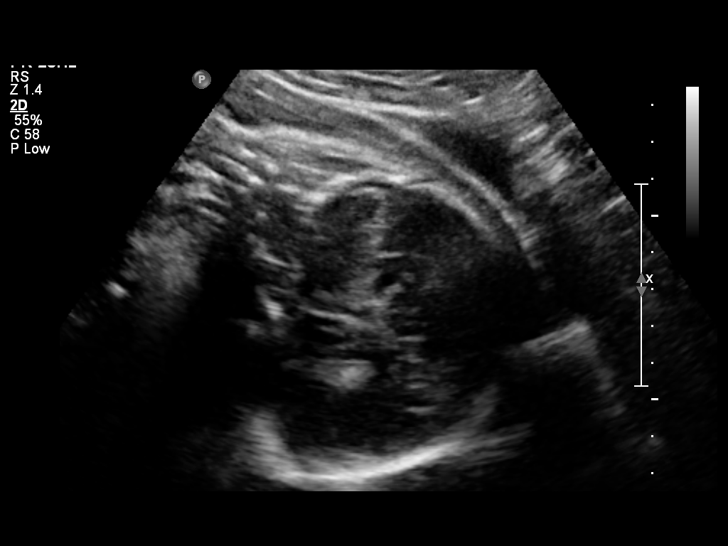
[im 35/55]
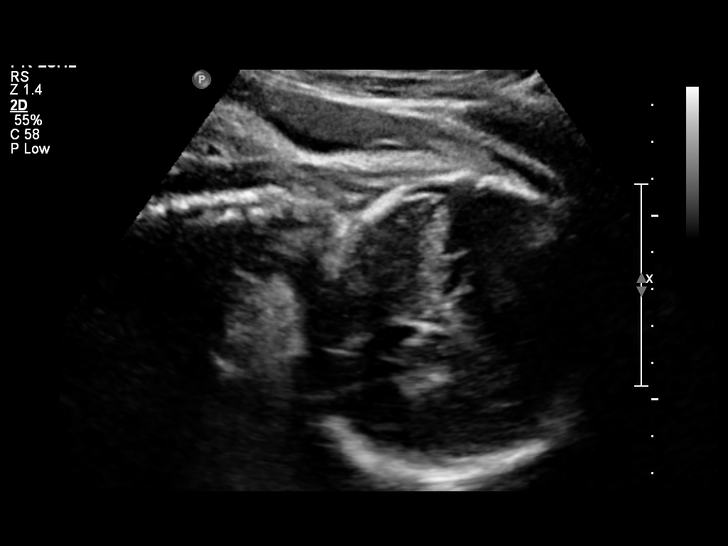
[im 39/55]
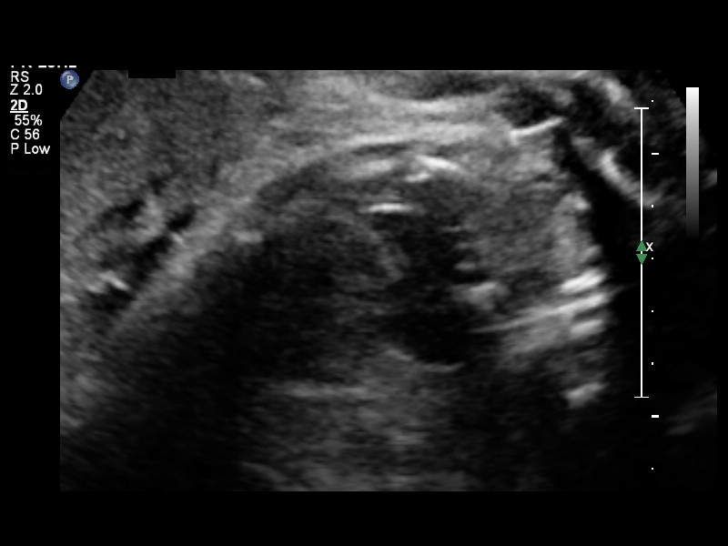
[im 45/55]
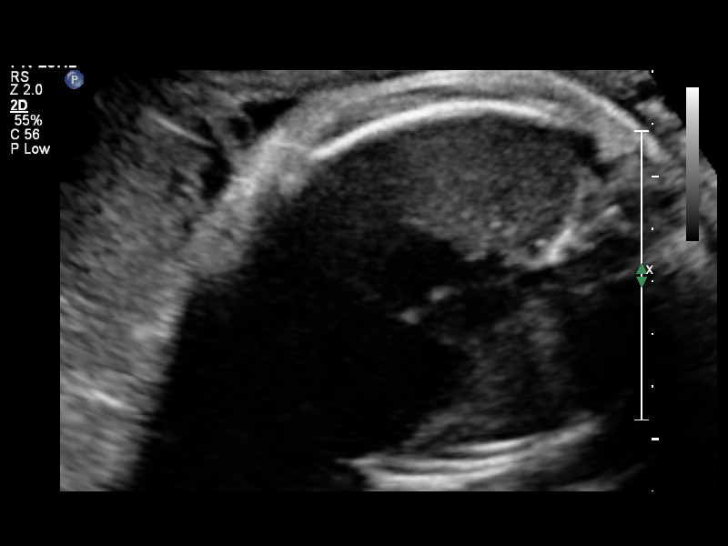
[im 49/55]
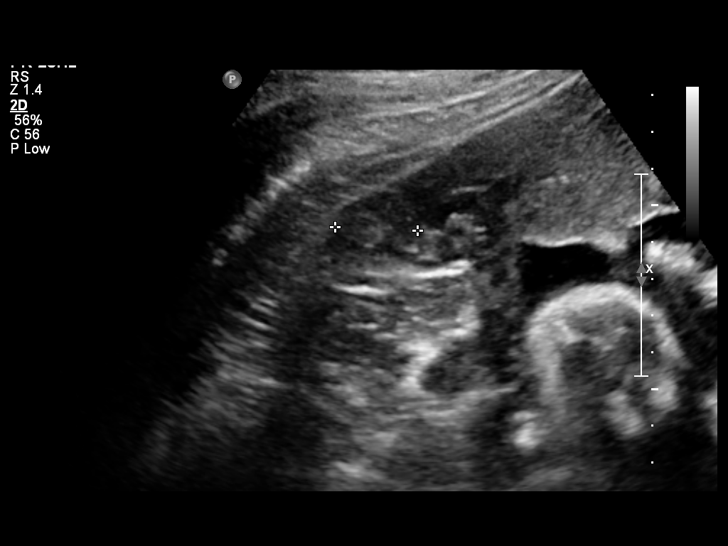
[im 53/55]
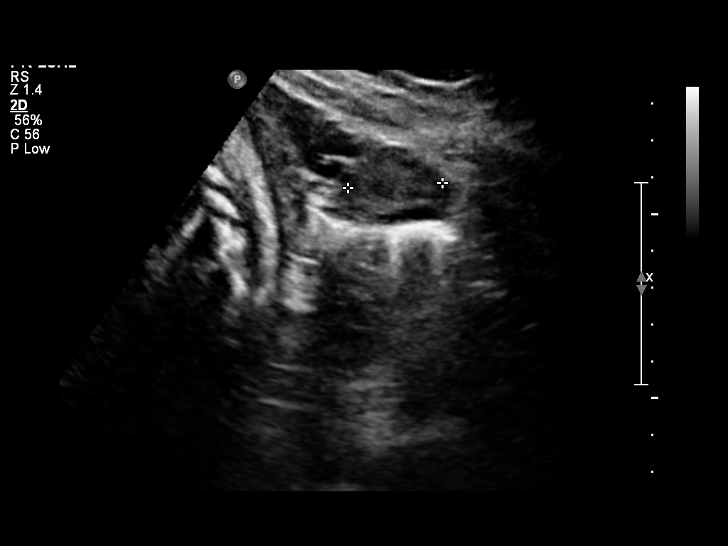

[12 of 28 positions shown; findings below may reference images not displayed]

OBSTETRICS REPORT
                      (Signed Final 02/13/2012 [DATE])

 Order#:         [id number]
Procedures

 US OB FOLLOW UP                                       76816.1
Indications

 HIV: Asymptomatic                                     647.63 V08
 Assess Fetal Growth / Estimated Fetal Weight
Fetal Evaluation

 Fetal Heart Rate:  142                         bpm
 Cardiac Activity:  Observed
 Presentation:      Cephalic
 Placenta:          Anterior rt, above cervical
                    os

 Amniotic Fluid
 AFI FV:      Subjectively low-normal
 AFI Sum:     10.16   cm      26   %Tile     Larg Pckt:   3.93   cm
 RUQ:   3.93   cm    RLQ:    2.14   cm    LUQ:   0.83    cm   LLQ:    3.26   cm
Biometry

 BPD:     87.1  mm    G. Age:   35w 1d                CI:        76.33   70 - 86
                                                      FL/HC:      21.9   20.9 -

 HC:     315.9  mm    G. Age:   35w 3d      < 3  %    HC/AC:      1.00   0.92 -

 AC:     316.1  mm    G. Age:   35w 4d       14  %    FL/BPD:     79.4   71 - 87
 FL:      69.2  mm    G. Age:   35w 4d        9  %    FL/AC:      21.9   20 - 24
 HUM:     59.8  mm    G. Age:   34w 5d       19  %

 Est. FW:    5006  gm    5 lb 15 oz      25  %
Gestational Age

 LMP:           30w 5d       Date:   07/13/11                 EDD:   04/18/12
 U/S Today:     35w 3d                                        EDD:   03/16/12
 Best:          37w 4d    Det. By:   U/S C R L   (08/28/11)   EDD:   03/01/12
Anatomy

 Cranium:           Appears normal      Aortic Arch:       Not well
                                                           visualized
 Fetal Cavum:       Previously seen     Ductal Arch:       Not well
                                                           visualized
 Ventricles:        Appears normal      Diaphragm:         Previously seen
 Choroid Plexus:    Previously seen     Stomach:           Appears
                                                           normal, left
                                                           sided
 Cerebellum:        Appears normal      Abdomen:           Appears normal
 Posterior Fossa:   Appears normal      Abdominal Wall:    Previously seen
 Nuchal Fold:       Not applicable      Cord Vessels:      Previously seen
                    (>20 wks GA)
 Face:              Previously seen     Kidneys:           Appear normal
 Heart:             Appears normal      Bladder:           Appears normal
                    (4 chamber &
                    axis)
 RVOT:              Appears normal      Spine:             Previously seen
 LVOT:              Appears normal      Limbs:             Previously seen

 Other:     Heels and 5th digit previously visualized. Fetus appears
            to be a female previously visualized.
Cervix Uterus Adnexa

 Cervix:       Not visualized (advanced GA >34 wks)
 Left Ovary:   Within normal limits.
 Right Ovary:  Within normal limits.
Impression

 Single living IUP with assigned GA of 37w 4d in cephalic
 position.
 Fetal growth is trending down. The EFW is at the 25th
 percentile on today's exam (previously at the 53rd percentile
 01/09/12).
 The amniotic fluid volume is subjectively and objectively low-
 normal. AFI is 10 cm.
 Report was telephoned to Dr. Erion by the sonographer.

## 2013-07-26 ENCOUNTER — Other Ambulatory Visit: Payer: Self-pay | Admitting: *Deleted

## 2013-07-26 DIAGNOSIS — B20 Human immunodeficiency virus [HIV] disease: Secondary | ICD-10-CM

## 2013-07-26 MED ORDER — EMTRICITAB-RILPIVIR-TENOFOV DF 200-25-300 MG PO TABS: 1.0000 | ORAL_TABLET | Freq: Every day | ORAL | Status: DC

## 2013-08-02 ENCOUNTER — Other Ambulatory Visit: Payer: Self-pay | Admitting: *Deleted

## 2013-08-02 DIAGNOSIS — B20 Human immunodeficiency virus [HIV] disease: Secondary | ICD-10-CM

## 2013-08-02 MED ORDER — EMTRICITAB-RILPIVIR-TENOFOV DF 200-25-300 MG PO TABS: 1.0000 | ORAL_TABLET | Freq: Every day | ORAL | Status: DC

## 2013-09-28 ENCOUNTER — Ambulatory Visit: Payer: Self-pay

## 2013-09-28 ENCOUNTER — Ambulatory Visit: Payer: Self-pay | Admitting: *Deleted

## 2013-09-29 ENCOUNTER — Other Ambulatory Visit: Payer: Self-pay | Admitting: *Deleted

## 2013-09-29 DIAGNOSIS — B20 Human immunodeficiency virus [HIV] disease: Secondary | ICD-10-CM

## 2013-09-29 MED ORDER — EMTRICITAB-RILPIVIR-TENOFOV DF 200-25-300 MG PO TABS
1.0000 | ORAL_TABLET | Freq: Every day | ORAL | Status: DC
Start: 2013-09-29 — End: 2014-02-01

## 2013-10-13 ENCOUNTER — Other Ambulatory Visit (INDEPENDENT_AMBULATORY_CARE_PROVIDER_SITE_OTHER): Payer: Self-pay

## 2013-10-13 DIAGNOSIS — Z113 Encounter for screening for infections with a predominantly sexual mode of transmission: Secondary | ICD-10-CM

## 2013-10-13 DIAGNOSIS — Z79899 Other long term (current) drug therapy: Secondary | ICD-10-CM

## 2013-10-13 DIAGNOSIS — B20 Human immunodeficiency virus [HIV] disease: Secondary | ICD-10-CM

## 2013-10-13 LAB — COMPLETE METABOLIC PANEL WITHOUT GFR
ALT: 153 U/L — ABNORMAL HIGH (ref 0–35)
AST: 84 U/L — ABNORMAL HIGH (ref 0–37)
Albumin: 4.2 g/dL (ref 3.5–5.2)
Alkaline Phosphatase: 70 U/L (ref 39–117)
BUN: 11 mg/dL (ref 6–23)
CO2: 26 meq/L (ref 19–32)
Calcium: 8.8 mg/dL (ref 8.4–10.5)
Chloride: 104 meq/L (ref 96–112)
Creat: 0.63 mg/dL (ref 0.50–1.10)
GFR, Est African American: >89 mL/min
GFR, Est Non African American: >89 mL/min
Glucose, Bld: 95 mg/dL (ref 70–99)
Potassium: 4 meq/L (ref 3.5–5.3)
Sodium: 138 meq/L (ref 135–145)
Total Bilirubin: 0.6 mg/dL (ref 0.2–1.2)
Total Protein: 6.9 g/dL (ref 6.0–8.3)

## 2013-10-13 LAB — LIPID PANEL
CHOLESTEROL: 119 mg/dL (ref 0–200)
HDL: 33 mg/dL — AB (ref >39)
LDL Cholesterol: 44 mg/dL (ref 0–99)
TRIGLYCERIDES: 211 mg/dL — AB (ref <150)
Total CHOL/HDL Ratio: 3.6 Ratio
VLDL: 42 mg/dL — AB (ref 0–40)

## 2013-10-13 LAB — CBC WITH DIFFERENTIAL/PLATELET
BASOS PCT: 0 % (ref 0–1)
Basophils Absolute: 0 10*3/uL (ref 0.0–0.1)
Eosinophils Absolute: 0.2 10*3/uL (ref 0.0–0.7)
Eosinophils Relative: 3 % (ref 0–5)
HCT: 38.4 % (ref 36.0–46.0)
Hemoglobin: 13.9 g/dL (ref 12.0–15.0)
Lymphocytes Relative: 28 % (ref 12–46)
Lymphs Abs: 2.2 10*3/uL (ref 0.7–4.0)
MCH: 30.2 pg (ref 26.0–34.0)
MCHC: 36.2 g/dL — AB (ref 30.0–36.0)
MCV: 83.5 fL (ref 78.0–100.0)
Monocytes Absolute: 0.5 10*3/uL (ref 0.1–1.0)
Monocytes Relative: 7 % (ref 3–12)
NEUTROS ABS: 4.8 10*3/uL (ref 1.7–7.7)
NEUTROS PCT: 62 % (ref 43–77)
PLATELETS: 288 10*3/uL (ref 150–400)
RBC: 4.6 MIL/uL (ref 3.87–5.11)
RDW: 13.7 % (ref 11.5–15.5)
WBC: 7.7 10*3/uL (ref 4.0–10.5)

## 2013-10-14 LAB — T-HELPER CELL (CD4) - (RCID CLINIC ONLY)
CD4 % Helper T Cell: 39 % (ref 33–55)
CD4 T CELL ABS: 840 /uL (ref 400–2700)

## 2013-10-14 LAB — HIV-1 RNA QUANT-NO REFLEX-BLD
HIV 1 RNA Quant: <20 copies/mL (ref <20)
HIV-1 RNA Quant, Log: <1.3 {Log} (ref <1.30)

## 2013-10-14 LAB — RPR

## 2013-10-25 ENCOUNTER — Encounter: Payer: Self-pay | Admitting: Infectious Diseases

## 2013-10-25 ENCOUNTER — Ambulatory Visit (INDEPENDENT_AMBULATORY_CARE_PROVIDER_SITE_OTHER): Payer: Self-pay | Admitting: Infectious Diseases

## 2013-10-25 VITALS — BP 114/64 | HR 78 | Temp 98.5°F | Wt 166.0 lb

## 2013-10-25 DIAGNOSIS — K1370 Unspecified lesions of oral mucosa: Secondary | ICD-10-CM

## 2013-10-25 DIAGNOSIS — Z23 Encounter for immunization: Secondary | ICD-10-CM

## 2013-10-25 DIAGNOSIS — K1379 Other lesions of oral mucosa: Secondary | ICD-10-CM | POA: Insufficient documentation

## 2013-10-25 DIAGNOSIS — B20 Human immunodeficiency virus [HIV] disease: Secondary | ICD-10-CM

## 2013-10-25 NOTE — Assessment & Plan Note (Signed)
Appears to have hyperplastic lesion of her gum. Will send her to dental for eval.

## 2013-10-25 NOTE — Progress Notes (Signed)
   Subjective:    Patient ID: Catherine Grimes, female    DOB: 1981-07-10, 33 y.o.   MRN: [id number]  HPI 33 yo F with HIV+, has been taking complera. At prev visit had recently miscarriage.  No problems with her ART. Here today with her 2 children. Had recent home pregnancy test+ but is having menses. NL pap 03-2013.  Has dental lesion she wants examined. No drainage, has bleeding with brushing her teeth.   HIV 1 RNA Quant (copies/mL)  Date Value  10/13/2013 <20   04/07/2013 <20   10/05/2012 <20      CD4 T Cell Abs (/uL)  Date Value  10/13/2013 840   04/07/2013 960   10/05/2012 830     Review of Systems  Constitutional: Negative for appetite change and unexpected weight change.  Gastrointestinal: Negative for diarrhea and constipation.  Genitourinary: Negative for difficulty urinating.      Objective:   Physical Exam  Constitutional: She appears well-developed and well-nourished.  HENT:  Mouth/Throat: No oropharyngeal exudate.  Eyes: EOM are normal. Pupils are equal, round, and reactive to light.  Neck: Neck supple.  Cardiovascular: Normal rate, regular rhythm and normal heart sounds.   Pulmonary/Chest: Effort normal and breath sounds normal.  Abdominal: Soft. Bowel sounds are normal. There is no tenderness. There is no rebound.  Lymphadenopathy:    She has no cervical adenopathy.          Assessment & Plan:

## 2013-10-25 NOTE — Assessment & Plan Note (Addendum)
She is doing very well. Will continue her current ART. Will check her pregnancy test. She is offered/provided condoms. Restart Hep B series. rtc 6 months.

## 2013-10-25 NOTE — Addendum Note (Signed)
Addended by: Myrtis Hopping A on: 10/25/2013 10:38 AM   Modules accepted: Orders

## 2013-10-26 LAB — HCG, SERUM, QUALITATIVE: Preg, Serum: NEGATIVE

## 2013-11-23 ENCOUNTER — Ambulatory Visit (INDEPENDENT_AMBULATORY_CARE_PROVIDER_SITE_OTHER): Payer: Self-pay | Admitting: Internal Medicine

## 2013-11-23 ENCOUNTER — Encounter: Payer: Self-pay | Admitting: Internal Medicine

## 2013-11-23 VITALS — BP 101/69 | HR 68 | Temp 98.1°F | Ht 63.0 in | Wt 160.0 lb

## 2013-11-23 DIAGNOSIS — K089 Disorder of teeth and supporting structures, unspecified: Secondary | ICD-10-CM

## 2013-11-23 DIAGNOSIS — K0889 Other specified disorders of teeth and supporting structures: Secondary | ICD-10-CM

## 2013-11-23 DIAGNOSIS — B20 Human immunodeficiency virus [HIV] disease: Secondary | ICD-10-CM

## 2013-11-23 DIAGNOSIS — Z23 Encounter for immunization: Secondary | ICD-10-CM

## 2013-11-23 MED ORDER — AMOXICILLIN 500 MG PO TABS: 500.0000 mg | ORAL_TABLET | Freq: Two times a day (BID) | ORAL | Status: DC

## 2013-11-23 MED ORDER — IBUPROFEN 800 MG PO TABS: 800.0000 mg | ORAL_TABLET | Freq: Three times a day (TID) | ORAL | Status: DC | PRN

## 2013-11-23 MED ORDER — ACETAMINOPHEN-CODEINE #3 300-30 MG PO TABS: 1.0000 | ORAL_TABLET | Freq: Three times a day (TID) | ORAL | Status: DC | PRN

## 2013-11-23 NOTE — Assessment & Plan Note (Signed)
Has appt with dental surgeon May 12th.  Tylenol #3, ibuprofen, amoxicillin.

## 2013-11-23 NOTE — Addendum Note (Signed)
Addended by: Landis Gandy on: 11/23/2013 11:51 AM   Modules accepted: Orders

## 2013-11-23 NOTE — Progress Notes (Signed)
   Subjective:    Patient ID: Catherine Grimes, female    DOB: 21-Jun-1981, 33 y.o.   MRN: [id number]  HPI [name] is here for a work in visit.  Has had tooth pain and now swollen gums, mouth.  + subjective fever.  Has dental appt next month. To have wisdom tooth pulled.     Review of Systems  Constitutional: Positive for fever.  HENT: Positive for dental problem.   Neurological: Negative for dizziness.  Hematological: Negative for adenopathy.       Objective:   Physical Exam  Constitutional: She appears well-developed.  HENT:  Swollen gums at right bottom          Assessment & Plan:

## 2013-11-23 NOTE — Assessment & Plan Note (Signed)
Hep B #2 today

## 2013-11-25 ENCOUNTER — Ambulatory Visit: Payer: Self-pay

## 2014-02-01 ENCOUNTER — Other Ambulatory Visit: Payer: Self-pay | Admitting: *Deleted

## 2014-02-01 DIAGNOSIS — B20 Human immunodeficiency virus [HIV] disease: Secondary | ICD-10-CM

## 2014-02-01 MED ORDER — EMTRICITAB-RILPIVIR-TENOFOV DF 200-25-300 MG PO TABS: 1.0000 | ORAL_TABLET | Freq: Every day | ORAL | Status: DC

## 2014-03-24 ENCOUNTER — Telehealth: Payer: Self-pay | Admitting: *Deleted

## 2014-03-24 NOTE — Telephone Encounter (Signed)
NON-working telephone #, unable to set up rx delivery.  Needing new or working number when available.

## 2014-04-05 ENCOUNTER — Ambulatory Visit: Payer: Self-pay

## 2014-04-08 ENCOUNTER — Telehealth: Payer: Self-pay | Admitting: *Deleted

## 2014-04-08 NOTE — Telephone Encounter (Signed)
[facility] unable to contact patient for medication delivery. Confirmed phone number.

## 2014-04-26 ENCOUNTER — Other Ambulatory Visit: Payer: Self-pay | Admitting: *Deleted

## 2014-04-26 DIAGNOSIS — B20 Human immunodeficiency virus [HIV] disease: Secondary | ICD-10-CM

## 2014-04-26 MED ORDER — EMTRICITAB-RILPIVIR-TENOFOV DF 200-25-300 MG PO TABS: 1.0000 | ORAL_TABLET | Freq: Every day | ORAL | Status: DC

## 2014-04-26 NOTE — Telephone Encounter (Signed)
ADAP Application 

## 2014-04-27 ENCOUNTER — Other Ambulatory Visit (INDEPENDENT_AMBULATORY_CARE_PROVIDER_SITE_OTHER): Payer: Self-pay

## 2014-04-27 DIAGNOSIS — B20 Human immunodeficiency virus [HIV] disease: Secondary | ICD-10-CM

## 2014-04-27 LAB — COMPREHENSIVE METABOLIC PANEL
ALT: 56 U/L — ABNORMAL HIGH (ref 0–35)
AST: 35 U/L (ref 0–37)
Albumin: 4 g/dL (ref 3.5–5.2)
Alkaline Phosphatase: 70 U/L (ref 39–117)
BILIRUBIN TOTAL: 0.3 mg/dL (ref 0.2–1.2)
BUN: 10 mg/dL (ref 6–23)
CO2: 25 mEq/L (ref 19–32)
CREATININE: 0.76 mg/dL (ref 0.50–1.10)
Calcium: 9.3 mg/dL (ref 8.4–10.5)
Chloride: 106 mEq/L (ref 96–112)
GLUCOSE: 89 mg/dL (ref 70–99)
Potassium: 4.4 mEq/L (ref 3.5–5.3)
SODIUM: 140 meq/L (ref 135–145)
Total Protein: 6.7 g/dL (ref 6.0–8.3)

## 2014-04-27 LAB — CBC
HCT: 38.2 % (ref 36.0–46.0)
Hemoglobin: 13.5 g/dL (ref 12.0–15.0)
MCH: 30.5 pg (ref 26.0–34.0)
MCHC: 35.3 g/dL (ref 30.0–36.0)
MCV: 86.4 fL (ref 78.0–100.0)
Platelets: 297 10*3/uL (ref 150–400)
RBC: 4.42 MIL/uL (ref 3.87–5.11)
RDW: 13.6 % (ref 11.5–15.5)
WBC: 6.8 10*3/uL (ref 4.0–10.5)

## 2014-04-28 LAB — T-HELPER CELL (CD4) - (RCID CLINIC ONLY)
CD4 % Helper T Cell: 45 % (ref 33–55)
CD4 T Cell Abs: 960 /uL (ref 400–2700)

## 2014-04-28 LAB — HIV-1 RNA QUANT-NO REFLEX-BLD: HIV-1 RNA Quant, Log: <1.3 {Log} (ref <1.30)

## 2014-05-02 ENCOUNTER — Telehealth: Payer: Self-pay | Admitting: *Deleted

## 2014-05-02 NOTE — Telephone Encounter (Signed)
Patient used interpreter to call for advice, she has only 2 pills of complera left and her ADAP has not yet been approved.  Patient's application mailed 9/79/89.  Patient will have to go without medication until her ADAP is reinstated.   Patient was advised to renew at the beginning of the period, rather than at the end.  Pt is advised to reapply January and July.  Patient instructed to take the rest of her pills daily, will be out of medication until it is approved.  Landis Gandy, RN

## 2014-05-05 ENCOUNTER — Telehealth: Payer: Self-pay | Admitting: *Deleted

## 2014-05-05 NOTE — Telephone Encounter (Signed)
Patient called concerned that she is about to be out of medication. She renewed her ADAP 04/26/14 and I advised her it usually takes 2 weeks so she should know something about 05/10/14. She is worried about being with out medications for that long. Advised her she will have to wait until she get the letter of approval from ADAP and she can refill that day. She asked that her doctor call her and I advised her she has an appt 05/16/14 and she can speak with him at that time. She was not satisfied with that and asked that I tell her doctor she would like for him to call her about her medication. Advised will do. Called the pharmacy and verified that the ADAP is not active yet.

## 2014-05-06 ENCOUNTER — Telehealth: Payer: Self-pay | Admitting: Infectious Diseases

## 2014-05-06 NOTE — Telephone Encounter (Signed)
Asked her to call back, we are awaiting her ADAP approval.

## 2014-05-06 NOTE — Telephone Encounter (Signed)
Still not active 

## 2014-05-16 ENCOUNTER — Ambulatory Visit (INDEPENDENT_AMBULATORY_CARE_PROVIDER_SITE_OTHER): Payer: Self-pay | Admitting: Infectious Diseases

## 2014-05-16 ENCOUNTER — Encounter: Payer: Self-pay | Admitting: Infectious Diseases

## 2014-05-16 VITALS — BP 101/68 | HR 65 | Temp 98.3°F | Wt 160.0 lb

## 2014-05-16 DIAGNOSIS — Z113 Encounter for screening for infections with a predominantly sexual mode of transmission: Secondary | ICD-10-CM

## 2014-05-16 DIAGNOSIS — B20 Human immunodeficiency virus [HIV] disease: Secondary | ICD-10-CM

## 2014-05-16 DIAGNOSIS — Z23 Encounter for immunization: Secondary | ICD-10-CM

## 2014-05-16 DIAGNOSIS — K1379 Other lesions of oral mucosa: Secondary | ICD-10-CM

## 2014-05-16 DIAGNOSIS — Z79899 Other long term (current) drug therapy: Secondary | ICD-10-CM

## 2014-05-16 NOTE — Addendum Note (Signed)
Addended by: Myrtis Hopping A on: 05/16/2014 12:08 PM   Modules accepted: Orders

## 2014-05-16 NOTE — Assessment & Plan Note (Signed)
Had Dr Sheran Spine come and eval her. This is a pregnancy releated, hypervascular lesion. It is not cancerous. She needs to have it removed it removed only if it is bothering her. Will get her appt.

## 2014-05-16 NOTE — Assessment & Plan Note (Signed)
She is doing well despite missed doses. She is aware of the need to be adherent. She is given condoms, states she wants to have more kids.  She gets flu shot, next hep B.  rtc 6 months.

## 2014-05-16 NOTE — Progress Notes (Signed)
   Subjective:    Patient ID: Catherine Grimes, female    DOB: 01/29/1981, 33 y.o.   MRN: [id number]  HPI 33 yo F with HIV+, has been on complera. Has had lapse in her ADAP and missed 7 days of tx. Has been back on for the last 5 days.   Has seen dental but did not have interpreter. Occas has bleeding when brushing her teeth.    HIV 1 RNA Quant (copies/mL)  Date Value  04/27/2014 <20   10/13/2013 <20   04/07/2013 <20      CD4 T Cell Abs (/uL)  Date Value  04/27/2014 960   10/13/2013 840   04/07/2013 960      Review of Systems     Objective:   Physical Exam  Constitutional: She appears well-developed and well-nourished.  HENT:  Mouth/Throat: No oropharyngeal exudate.    Eyes: EOM are normal. Pupils are equal, round, and reactive to light.  Neck: Neck supple.  Cardiovascular: Normal rate, regular rhythm and normal heart sounds.   Pulmonary/Chest: Effort normal and breath sounds normal.  Abdominal: Soft. Bowel sounds are normal. She exhibits no distension. There is no tenderness.  Lymphadenopathy:    She has no cervical adenopathy.          Assessment & Plan:

## 2014-05-30 ENCOUNTER — Encounter: Payer: Self-pay | Admitting: Infectious Diseases

## 2014-06-21 ENCOUNTER — Telehealth: Payer: Self-pay

## 2014-06-21 ENCOUNTER — Other Ambulatory Visit: Payer: Self-pay | Admitting: Infectious Diseases

## 2014-06-21 ENCOUNTER — Other Ambulatory Visit: Payer: Self-pay

## 2014-06-21 DIAGNOSIS — K069 Disorder of gingiva and edentulous alveolar ridge, unspecified: Secondary | ICD-10-CM

## 2014-06-21 NOTE — Telephone Encounter (Signed)
Patient was seen in [facility] on 06-21-14. Gum biopsy was performed by Dr Max Fickle.  Lingual to teeth # 28 and 29.  Hormonal cyst  2 cm .   Specimen sent .   Lab # [id number]     Ordered under Dr Bobby Rumpf.   Laverle Patter, RN

## 2014-06-21 NOTE — Progress Notes (Signed)
Patient was seen in [facility] on 06-21-14. Gum biopsy was performed by Dr Max Fickle.  Lingual to teeth # 28 and 29.  Hormonal cyst  2 cm .   Laverle Patter, RN

## 2014-06-22 ENCOUNTER — Other Ambulatory Visit: Payer: Self-pay

## 2014-06-24 ENCOUNTER — Encounter: Payer: Self-pay | Admitting: Internal Medicine

## 2014-06-24 DIAGNOSIS — K134 Granuloma and granuloma-like lesions of oral mucosa: Secondary | ICD-10-CM | POA: Insufficient documentation

## 2014-08-15 ENCOUNTER — Ambulatory Visit: Payer: Self-pay

## 2014-08-17 ENCOUNTER — Ambulatory Visit: Payer: Self-pay

## 2014-08-17 ENCOUNTER — Other Ambulatory Visit: Payer: Self-pay | Admitting: *Deleted

## 2014-08-17 DIAGNOSIS — B20 Human immunodeficiency virus [HIV] disease: Secondary | ICD-10-CM

## 2014-08-17 MED ORDER — EMTRICITAB-RILPIVIR-TENOFOV DF 200-25-300 MG PO TABS: 1.0000 | ORAL_TABLET | Freq: Every day | ORAL | Status: DC

## 2014-08-17 NOTE — Telephone Encounter (Signed)
ADAP Application 

## 2014-10-26 ENCOUNTER — Other Ambulatory Visit: Payer: Self-pay | Admitting: Internal Medicine

## 2014-10-26 DIAGNOSIS — B20 Human immunodeficiency virus [HIV] disease: Secondary | ICD-10-CM

## 2014-10-31 ENCOUNTER — Other Ambulatory Visit (INDEPENDENT_AMBULATORY_CARE_PROVIDER_SITE_OTHER): Payer: Self-pay

## 2014-10-31 DIAGNOSIS — Z79899 Other long term (current) drug therapy: Secondary | ICD-10-CM

## 2014-10-31 DIAGNOSIS — Z113 Encounter for screening for infections with a predominantly sexual mode of transmission: Secondary | ICD-10-CM

## 2014-10-31 DIAGNOSIS — B20 Human immunodeficiency virus [HIV] disease: Secondary | ICD-10-CM

## 2014-10-31 LAB — LIPID PANEL
CHOL/HDL RATIO: 3.6 ratio
Cholesterol: 114 mg/dL (ref 0–200)
HDL: 32 mg/dL — ABNORMAL LOW (ref >=46)
LDL Cholesterol: 62 mg/dL (ref 0–99)
Triglycerides: 100 mg/dL (ref <150)
VLDL: 20 mg/dL (ref 0–40)

## 2014-10-31 LAB — COMPREHENSIVE METABOLIC PANEL
ALT: 169 U/L — AB (ref 0–35)
AST: 78 U/L — ABNORMAL HIGH (ref 0–37)
Albumin: 4.3 g/dL (ref 3.5–5.2)
Alkaline Phosphatase: 86 U/L (ref 39–117)
BILIRUBIN TOTAL: 0.6 mg/dL (ref 0.2–1.2)
BUN: 11 mg/dL (ref 6–23)
CO2: 27 mEq/L (ref 19–32)
CREATININE: 0.68 mg/dL (ref 0.50–1.10)
Calcium: 9.3 mg/dL (ref 8.4–10.5)
Chloride: 104 mEq/L (ref 96–112)
GLUCOSE: 91 mg/dL (ref 70–99)
Potassium: 4.2 mEq/L (ref 3.5–5.3)
Sodium: 141 mEq/L (ref 135–145)
Total Protein: 7.1 g/dL (ref 6.0–8.3)

## 2014-10-31 LAB — CBC
HCT: 40.8 % (ref 36.0–46.0)
Hemoglobin: 14.1 g/dL (ref 12.0–15.0)
MCH: 30.3 pg (ref 26.0–34.0)
MCHC: 34.6 g/dL (ref 30.0–36.0)
MCV: 87.6 fL (ref 78.0–100.0)
MPV: 9.6 fL (ref 8.6–12.4)
PLATELETS: 269 10*3/uL (ref 150–400)
RBC: 4.66 MIL/uL (ref 3.87–5.11)
RDW: 13.4 % (ref 11.5–15.5)
WBC: 6.1 10*3/uL (ref 4.0–10.5)

## 2014-10-31 LAB — RPR

## 2014-11-01 LAB — URINE CYTOLOGY ANCILLARY ONLY
Chlamydia: NEGATIVE
Neisseria Gonorrhea: NEGATIVE

## 2014-11-01 LAB — T-HELPER CELL (CD4) - (RCID CLINIC ONLY)
CD4 % Helper T Cell: 43 % (ref 33–55)
CD4 T Cell Abs: 790 /uL (ref 400–2700)

## 2014-11-01 LAB — HIV-1 RNA QUANT-NO REFLEX-BLD: HIV-1 RNA Quant, Log: <1.3 {Log} (ref <1.30)

## 2014-11-14 ENCOUNTER — Ambulatory Visit (INDEPENDENT_AMBULATORY_CARE_PROVIDER_SITE_OTHER): Payer: Self-pay | Admitting: Infectious Diseases

## 2014-11-14 ENCOUNTER — Encounter: Payer: Self-pay | Admitting: Infectious Diseases

## 2014-11-14 VITALS — BP 114/74 | HR 87 | Temp 98.8°F | Ht 61.42 in | Wt 171.0 lb

## 2014-11-14 DIAGNOSIS — B20 Human immunodeficiency virus [HIV] disease: Secondary | ICD-10-CM

## 2014-11-14 DIAGNOSIS — K134 Granuloma and granuloma-like lesions of oral mucosa: Secondary | ICD-10-CM

## 2014-11-14 DIAGNOSIS — Z23 Encounter for immunization: Secondary | ICD-10-CM

## 2014-11-14 LAB — POCT URINE PREGNANCY: Preg Test, Ur: NEGATIVE

## 2014-11-14 MED ORDER — GNP PRENATAL VITAMINS 28-0.8 MG PO TABS: 1.0000 | ORAL_TABLET | Freq: Every day | ORAL | Status: DC

## 2014-11-14 NOTE — Addendum Note (Signed)
Addended by: Landis Gandy on: 11/14/2014 03:41 PM   Modules accepted: Orders

## 2014-11-14 NOTE — Progress Notes (Signed)
   Subjective:    Patient ID: Catherine Grimes, female    DOB: [birth year], 34 y.o.   MRN: [id number]  HPI 34 yo F with HIV+, has been on complera.  HIV 1 RNA QUANT (copies/mL)  Date Value  10/31/2014 <20  04/27/2014 <20  10/13/2013 <20   CD4 T CELL ABS (/uL)  Date Value  10/31/2014 790  04/27/2014 960  10/13/2013 840   Wants U[facility]cg. Her last period was in August however has had small amt of bleeding with urinating, once, last month. Has had menses q 3 months previously.  Has been taking PNV. Needs PNVx.   Review of Systems  Constitutional: Negative for appetite change and unexpected weight change.  Genitourinary: Positive for hematuria and menstrual problem. Negative for dysuria and difficulty urinating.       Objective:   Physical Exam  Constitutional: She appears well-developed and well-nourished.  HENT:  Mouth/Throat: No oropharyngeal exudate.  Eyes: EOM are normal. Pupils are equal, round, and reactive to light.  Neck: Neck supple.  Cardiovascular: Normal rate, regular rhythm and normal heart sounds.   Pulmonary/Chest: Effort normal and breath sounds normal.  Abdominal: Soft. Bowel sounds are normal. There is no tenderness. There is no rebound.  Lymphadenopathy:    She has no cervical adenopathy.      Assessment & Plan:

## 2014-11-14 NOTE — Assessment & Plan Note (Signed)
Has recurred, will have her seen by dental again.

## 2014-11-14 NOTE — Assessment & Plan Note (Signed)
She is doing very well.  Will give her condoms.  Will check urine HCG.  Her daughter's tests have all been (-).  rtc in 6 months.

## 2015-01-31 ENCOUNTER — Ambulatory Visit: Payer: Self-pay | Admitting: Internal Medicine

## 2015-02-02 ENCOUNTER — Encounter: Payer: Self-pay | Admitting: Internal Medicine

## 2015-02-02 ENCOUNTER — Ambulatory Visit (INDEPENDENT_AMBULATORY_CARE_PROVIDER_SITE_OTHER): Payer: Self-pay | Admitting: Internal Medicine

## 2015-02-02 VITALS — BP 100/69 | HR 90 | Temp 98.4°F | Wt 173.0 lb

## 2015-02-02 DIAGNOSIS — B20 Human immunodeficiency virus [HIV] disease: Secondary | ICD-10-CM

## 2015-02-02 DIAGNOSIS — N926 Irregular menstruation, unspecified: Secondary | ICD-10-CM | POA: Insufficient documentation

## 2015-02-02 LAB — POCT URINE PREGNANCY: Preg Test, Ur: NEGATIVE

## 2015-02-02 NOTE — Progress Notes (Signed)
   Subjective:    Patient ID: Catherine Grimes, female    DOB: 12-14-1980, 34 y.o.   MRN: [id number]  HPI Here for a work in visit.  HIV positive on Complera.  Last menstrual period with spotting in March.  Did a pregnancy test at home and was positive.    Review of Systems  Constitutional: Negative for fatigue.  Gastrointestinal: Negative for nausea and diarrhea.  Skin: Negative for rash.       Objective:   Physical Exam  Constitutional: She appears well-developed and well-nourished. No distress.  Eyes: No scleral icterus.  Cardiovascular: Normal rate, regular rhythm and normal heart sounds.   No murmur heard. Pulmonary/Chest: Effort normal and breath sounds normal. No respiratory distress.          Assessment & Plan:

## 2015-02-02 NOTE — Assessment & Plan Note (Signed)
POC urine pregnancy negative.  Will check blood HCG to confirm.  Will refer if positive.

## 2015-02-03 ENCOUNTER — Telehealth: Payer: Self-pay | Admitting: Licensed Clinical Social Worker

## 2015-02-03 LAB — HCG, QUANTITATIVE, PREGNANCY

## 2015-02-03 NOTE — Telephone Encounter (Signed)
Patient is aware of results.

## 2015-02-03 NOTE — Telephone Encounter (Signed)
-----   Message from Thayer Headings, MD sent at 02/03/2015 10:30 AM EDT ----- Please let her know that her pregancy blood test is confirmed negative so she is not pregnant. thanks

## 2015-02-08 ENCOUNTER — Ambulatory Visit: Payer: Self-pay

## 2015-04-25 ENCOUNTER — Other Ambulatory Visit: Payer: Self-pay | Admitting: Infectious Diseases

## 2015-04-25 DIAGNOSIS — B20 Human immunodeficiency virus [HIV] disease: Secondary | ICD-10-CM

## 2015-04-26 ENCOUNTER — Other Ambulatory Visit (INDEPENDENT_AMBULATORY_CARE_PROVIDER_SITE_OTHER): Payer: Self-pay

## 2015-04-26 DIAGNOSIS — B20 Human immunodeficiency virus [HIV] disease: Secondary | ICD-10-CM

## 2015-04-26 LAB — CBC WITH DIFFERENTIAL/PLATELET
BASOS PCT: 1 % (ref 0–1)
Basophils Absolute: 0.1 10*3/uL (ref 0.0–0.1)
EOS PCT: 3 % (ref 0–5)
Eosinophils Absolute: 0.2 10*3/uL (ref 0.0–0.7)
HEMATOCRIT: 38.7 % (ref 36.0–46.0)
HEMOGLOBIN: 13.8 g/dL (ref 12.0–15.0)
Lymphocytes Relative: 36 % (ref 12–46)
Lymphs Abs: 2 10*3/uL (ref 0.7–4.0)
MCH: 30.8 pg (ref 26.0–34.0)
MCHC: 35.7 g/dL (ref 30.0–36.0)
MCV: 86.4 fL (ref 78.0–100.0)
MONO ABS: 0.3 10*3/uL (ref 0.1–1.0)
MPV: 10 fL (ref 8.6–12.4)
Monocytes Relative: 6 % (ref 3–12)
Neutro Abs: 3 10*3/uL (ref 1.7–7.7)
Neutrophils Relative %: 54 % (ref 43–77)
Platelets: 269 10*3/uL (ref 150–400)
RBC: 4.48 MIL/uL (ref 3.87–5.11)
RDW: 13.4 % (ref 11.5–15.5)
WBC: 5.5 10*3/uL (ref 4.0–10.5)

## 2015-04-26 LAB — COMPLETE METABOLIC PANEL WITH GFR
ALBUMIN: 4.2 g/dL (ref 3.6–5.1)
ALK PHOS: 91 U/L (ref 33–115)
ALT: 142 U/L — AB (ref 6–29)
AST: 75 U/L — AB (ref 10–30)
BILIRUBIN TOTAL: 0.4 mg/dL (ref 0.2–1.2)
BUN: 9 mg/dL (ref 7–25)
CALCIUM: 9.8 mg/dL (ref 8.6–10.2)
CO2: 29 mmol/L (ref 20–31)
CREATININE: 0.61 mg/dL (ref 0.50–1.10)
Chloride: 104 mmol/L (ref 98–110)
GFR, Est African American: >89 mL/min (ref >=60)
GFR, Est Non African American: >89 mL/min (ref >=60)
GLUCOSE: 81 mg/dL (ref 65–99)
Potassium: 4.4 mmol/L (ref 3.5–5.3)
Sodium: 138 mmol/L (ref 135–146)
TOTAL PROTEIN: 7.1 g/dL (ref 6.1–8.1)

## 2015-04-27 LAB — T-HELPER CELL (CD4) - (RCID CLINIC ONLY)
CD4 T CELL ABS: 860 /uL (ref 400–2700)
CD4 T CELL HELPER: 41 % (ref 33–55)

## 2015-04-27 LAB — HIV-1 RNA QUANT-NO REFLEX-BLD

## 2015-05-10 ENCOUNTER — Ambulatory Visit (INDEPENDENT_AMBULATORY_CARE_PROVIDER_SITE_OTHER): Payer: Self-pay | Admitting: Infectious Diseases

## 2015-05-10 ENCOUNTER — Encounter: Payer: Self-pay | Admitting: Infectious Diseases

## 2015-05-10 VITALS — BP 115/72 | HR 70 | Temp 97.7°F | Wt 171.0 lb

## 2015-05-10 DIAGNOSIS — B20 Human immunodeficiency virus [HIV] disease: Secondary | ICD-10-CM

## 2015-05-10 DIAGNOSIS — Z23 Encounter for immunization: Secondary | ICD-10-CM

## 2015-05-10 DIAGNOSIS — Z113 Encounter for screening for infections with a predominantly sexual mode of transmission: Secondary | ICD-10-CM

## 2015-05-10 DIAGNOSIS — K134 Granuloma and granuloma-like lesions of oral mucosa: Secondary | ICD-10-CM

## 2015-05-10 DIAGNOSIS — N926 Irregular menstruation, unspecified: Secondary | ICD-10-CM

## 2015-05-10 DIAGNOSIS — Z79899 Other long term (current) drug therapy: Secondary | ICD-10-CM

## 2015-05-10 MED ORDER — GNP PRENATAL VITAMINS 28-0.8 MG PO TABS: 1.0000 | ORAL_TABLET | Freq: Every day | ORAL | Status: DC

## 2015-05-10 NOTE — Assessment & Plan Note (Signed)
Will refer her to GYN.  

## 2015-05-10 NOTE — Assessment & Plan Note (Addendum)
Given condoms Will change her ART to [facility]. Take with food.  Gets flu shot today.  Refill vitamins. Her partner will come in for testing.  Will see her back in 4 months.

## 2015-05-10 NOTE — Assessment & Plan Note (Signed)
Has been removed, again.  Has dental f/u this month.

## 2015-05-10 NOTE — Addendum Note (Signed)
Addended by: Janyce Llanos F on: 05/10/2015 12:19 PM   Modules accepted: Orders

## 2015-05-10 NOTE — Progress Notes (Signed)
   Subjective:    Patient ID: Catherine Grimes, female    DOB: 06/25/1981, 34 y.o.   MRN: [id number]  HPI 34 yo F with hx of HIV+, Taking complera, no other meds.   HIV 1 RNA QUANT (copies/mL)  Date Value  04/26/2015 <20  10/31/2014 <20  04/27/2014 <20   CD4 T CELL ABS (/uL)  Date Value  04/26/2015 860  10/31/2014 790  04/27/2014 960    Was seen in July with positive urine preg test, f/u in clinic was negative.  Is trying to get pregnant. Husband is negative, last test was 2 year.   Irregular menses for at least 1 year. Has not seen GYN.    Review of Systems  Constitutional: Negative for appetite change and unexpected weight change.  Respiratory: Negative for cough.   Gastrointestinal: Positive for blood in stool. Negative for constipation.  Genitourinary: Positive for menstrual problem. Negative for difficulty urinating.       Objective:   Physical Exam  Constitutional: She appears well-developed and well-nourished.  HENT:  Mouth/Throat: No oropharyngeal exudate.  Eyes: EOM are normal. Pupils are equal, round, and reactive to light.  Neck: Neck supple.  Cardiovascular: Normal rate, regular rhythm and normal heart sounds.   Pulmonary/Chest: Effort normal.  Abdominal: Soft. Bowel sounds are normal. There is no tenderness. There is no rebound.  Musculoskeletal: She exhibits no edema.  Lymphadenopathy:    She has no cervical adenopathy.       Assessment & Plan:

## 2015-05-31 ENCOUNTER — Ambulatory Visit (INDEPENDENT_AMBULATORY_CARE_PROVIDER_SITE_OTHER): Payer: Self-pay | Admitting: Obstetrics & Gynecology

## 2015-05-31 ENCOUNTER — Encounter: Payer: Self-pay | Admitting: Obstetrics & Gynecology

## 2015-05-31 VITALS — BP 103/71 | HR 78 | Temp 98.4°F | Resp 20 | Ht 60.0 in | Wt 172.8 lb

## 2015-05-31 DIAGNOSIS — N926 Irregular menstruation, unspecified: Secondary | ICD-10-CM

## 2015-05-31 MED ORDER — MEDROXYPROGESTERONE ACETATE 10 MG PO TABS: 10.0000 mg | ORAL_TABLET | Freq: Every day | ORAL | Status: DC

## 2015-05-31 NOTE — Patient Instructions (Signed)
[name]  (Secondary Amenorrhea) La amenorrea secundaria es la detencin del flujo menstrual durante 3-6 meses en una mujer que previamente tena sus perodos. Hay muchas causas posibles: La mayora de las causas no son graves. Generalmente, al tratar el problema subyacente que causa la detencin de la [place], podr volver a tener sus perodos normales. CAUSAS  Algunas causas comunes de la falta de menstruacin son:  Desnutricin.  Bajo nivel de Dispensing optician (hipoglucemia).  Enfermedad poliqustica de los ovarios.  Estrs o miedos.  Catherine Grimes.  Desequilibrio hormonal.  Insuficiencia ovrica.  Medicamentos.  Obesidad extrema.  Fibrosis qustica.  Reduccin de peso drstica por cualquier causa.  Menopausia precoz.  Extirpacin de los ovarios o del tero.  Anticonceptivos.  Enfermedades.  Enfermedades de larga duracin (crnicas).  Sndrome de [place].  Problemas de tiroides.  Pldoras, parches o anillos vaginales para el control de la natalidad. FACTORES DE RIESGO Puede tener ms riesgo de amenorrea secundaria si:  Tiene una historia familiar de este problema.  Sufre un trastorno alimentario.  Realiza entrenamiento deportivo. DIAGNSTICO  Este diagnstico la realiza el mdico por medio de la historia clnica y el examen fsico. Incluir un examen plvico para verificar si hay problemas en los rganos reproductores. Debe descartarse la posibilidad de embarazo. Generalmente se indicarn diferentes anlisis de sangre para medir diferentes tipos de hormonas en el organismo. Le indicarn anlisis de [place]. Le harn algunos estudios especializados (ecografas, tomografa computada, resonancia magntica o histeroscopa) y tambin medirn su ndice de masa corporal [facility]). TRATAMIENTO  El tratamiento depende de la causa de la [place]. Si hay un trastorno de Youth worker, deber tratarse con la dieta y la terapia [place]. Los  trastornos crnicos pueden mejorar con el tratamiento de la enfermedad. La amenorrea puede corregirse con medicamentos, cambios en el estilo de vida o con [place]. Si la amenorrea no puede corregirse, algunas veces es posible crear una falsa menstruacin con medicamentos. INSTRUCCIONES PARA EL CUIDADO EN EL HOGAR  Consuma una dieta saludable.  Controle los problemas de [place].  Haga ejercicios con regularidad, pero no excesivamente.  Duerma lo suficiente.  Controle el estrs.  Observe si hay cambios en el ciclo menstrual. Mantenga un registro del momento en que ocurren los perodos. Anote la fecha de inicio de los perodos, cunto duran y si hay problemas. SOLICITE ATENCIN MDICA SI: Los sntomas no mejoran con Dispensing optician.   Esta informacin no tiene Marine scientist el consejo del mdico. Asegrese de hacerle al mdico cualquier pregunta que tenga.   Document Released: 03/17/2013 Document Revised: 08/05/2014 Elsevier Interactive Patient Education [employer].

## 2015-05-31 NOTE — Progress Notes (Signed)
Patient ID: Catherine Grimes, female   DOB: Oct 31, 1980, 34 y.o.   MRN: [id number]  Chief Complaint  Patient presents with  . Referral    amenorrhea, pelvic pain    Pt's visit today is because she has not had a period since her pregnancy loss in 2014. She would like to be pregnant again. She also has been having pelvic pain x2 weeks.    HPI Catherine Grimes is a 34 y.o. female.  [id number] No LMP recorded. Patient is not currently having periods (Reason: Irregular Periods).    Pt's visit today is because she has not had a period since her pregnancy loss in 2014. She would like to be pregnant again. She also has been having pelvic pain x2 weeks.   HPI  Past Medical History  Diagnosis Date  . HIV infection ([place])   . Infection     HIV    Past Surgical History  Procedure Laterality Date  . No past surgeries      Family History  Problem Relation Age of Onset  . Diabetes Mother   . Diabetes Father     Social History Social History  Substance Use Topics  . Smoking status: Never Smoker   . Smokeless tobacco: Never Used  . Alcohol Use: No    No Known Allergies  Current Outpatient Prescriptions  Medication Sig Dispense Refill  . Emtricitab-Rilpivir-Tenofov DF (COMPLERA) 200-25-300 MG TABS Take 1 tablet by mouth daily.    . Prenatal Vit-Fe Fumarate-FA (GNP PRENATAL VITAMINS) 28-0.8 MG TABS Take 1 tablet by mouth daily. 90 tablet 3  . medroxyPROGESTERone (PROVERA) 10 MG tablet Take 1 tablet (10 mg total) by mouth daily. Use for ten days, every 6-8 weeks if no menses 10 tablet 2   No current facility-administered medications for this visit.    Review of Systems Review of Systems  Constitutional: Positive for unexpected weight change (weight gain 24 lb 3 years).  Genitourinary: Positive for menstrual problem and pelvic pain. Negative for dysuria, vaginal bleeding and vaginal discharge.    Blood pressure 103/71, pulse 78, temperature 98.4 F (36.9 C), temperature  source Oral, resp. rate 20, height 5' (1.524 m), weight 172 lb 12.8 oz (78.382 kg).  Physical Exam Physical Exam  Constitutional: She is oriented to person, place, and time. She appears well-developed. No distress.  obese  Pulmonary/Chest: Effort normal.  Genitourinary: Vagina normal and uterus normal. No vaginal discharge (clear cx mucus with spinnbarkeit) found.  Neurological: She is alert and oriented to person, place, and time.  Skin: Skin is warm and dry.  Psychiatric: She has a normal mood and affect. Her behavior is normal.    Data Reviewed Meds Clinic notes Vitals and weight  Assessment    Amenorrhea c/w anovulation HIV good control Does not want to be on BCM, i.e. pills     Plan    Provera challenge every 6-8 weeks if no menses Menstrual calendar RTC 4 months        Catherine Grimes 05/31/2015, 4:56 PM

## 2015-05-31 NOTE — Progress Notes (Signed)
Interpreter Catherine Grimes present for encounter.  Pt's visit today is because she has not had a period since her pregnancy loss in 2014. She would like to be pregnant again. She also has been having pelvic pain x2 weeks.

## 2015-08-07 ENCOUNTER — Ambulatory Visit: Payer: Self-pay

## 2015-08-14 ENCOUNTER — Ambulatory Visit: Payer: Self-pay

## 2015-08-15 ENCOUNTER — Ambulatory Visit: Payer: Self-pay

## 2015-09-06 ENCOUNTER — Other Ambulatory Visit (INDEPENDENT_AMBULATORY_CARE_PROVIDER_SITE_OTHER): Payer: Self-pay

## 2015-09-06 DIAGNOSIS — B20 Human immunodeficiency virus [HIV] disease: Secondary | ICD-10-CM

## 2015-09-06 DIAGNOSIS — Z113 Encounter for screening for infections with a predominantly sexual mode of transmission: Secondary | ICD-10-CM

## 2015-09-06 DIAGNOSIS — Z79899 Other long term (current) drug therapy: Secondary | ICD-10-CM

## 2015-09-06 LAB — COMPLETE METABOLIC PANEL WITH GFR
ALBUMIN: 4 g/dL (ref 3.6–5.1)
ALK PHOS: 82 U/L (ref 33–115)
ALT: 198 U/L — AB (ref 6–29)
AST: 111 U/L — AB (ref 10–30)
BILIRUBIN TOTAL: 0.4 mg/dL (ref 0.2–1.2)
BUN: 8 mg/dL (ref 7–25)
CALCIUM: 9.4 mg/dL (ref 8.6–10.2)
CO2: 24 mmol/L (ref 20–31)
CREATININE: 0.69 mg/dL (ref 0.50–1.10)
Chloride: 104 mmol/L (ref 98–110)
GFR, Est African American: >89 mL/min (ref >=60)
GFR, Est Non African American: >89 mL/min (ref >=60)
Glucose, Bld: 71 mg/dL (ref 65–99)
Potassium: 4.1 mmol/L (ref 3.5–5.3)
Sodium: 136 mmol/L (ref 135–146)
TOTAL PROTEIN: 7 g/dL (ref 6.1–8.1)

## 2015-09-06 LAB — CBC
HEMATOCRIT: 40.2 % (ref 36.0–46.0)
HEMOGLOBIN: 13.8 g/dL (ref 12.0–15.0)
MCH: 29.7 pg (ref 26.0–34.0)
MCHC: 34.3 g/dL (ref 30.0–36.0)
MCV: 86.5 fL (ref 78.0–100.0)
MPV: 10.3 fL (ref 8.6–12.4)
Platelets: 287 10*3/uL (ref 150–400)
RBC: 4.65 MIL/uL (ref 3.87–5.11)
RDW: 13 % (ref 11.5–15.5)
WBC: 7.3 10*3/uL (ref 4.0–10.5)

## 2015-09-06 LAB — LIPID PANEL
CHOLESTEROL: 140 mg/dL (ref 125–200)
HDL: 36 mg/dL — ABNORMAL LOW (ref >=46)
LDL Cholesterol: 81 mg/dL (ref <130)
Total CHOL/HDL Ratio: 3.9 Ratio (ref <=5.0)
Triglycerides: 116 mg/dL (ref <150)
VLDL: 23 mg/dL (ref <30)

## 2015-09-07 LAB — HIV-1 RNA QUANT-NO REFLEX-BLD: HIV 1 RNA Quant: <20 copies/mL (ref <20)

## 2015-09-07 LAB — RPR

## 2015-09-07 LAB — T-HELPER CELL (CD4) - (RCID CLINIC ONLY)
CD4 % Helper T Cell: 44 % (ref 33–55)
CD4 T CELL ABS: 1100 /uL (ref 400–2700)

## 2015-09-20 ENCOUNTER — Ambulatory Visit: Payer: Self-pay | Admitting: Infectious Diseases

## 2015-10-04 ENCOUNTER — Other Ambulatory Visit: Payer: Self-pay | Admitting: *Deleted

## 2015-10-04 ENCOUNTER — Ambulatory Visit (INDEPENDENT_AMBULATORY_CARE_PROVIDER_SITE_OTHER): Payer: Self-pay | Admitting: Infectious Diseases

## 2015-10-04 ENCOUNTER — Encounter: Payer: Self-pay | Admitting: Infectious Diseases

## 2015-10-04 VITALS — BP 116/76 | HR 105 | Temp 99.8°F | Ht 61.0 in | Wt 174.0 lb

## 2015-10-04 DIAGNOSIS — B20 Human immunodeficiency virus [HIV] disease: Secondary | ICD-10-CM

## 2015-10-04 DIAGNOSIS — K759 Inflammatory liver disease, unspecified: Secondary | ICD-10-CM

## 2015-10-04 DIAGNOSIS — Z113 Encounter for screening for infections with a predominantly sexual mode of transmission: Secondary | ICD-10-CM

## 2015-10-04 MED ORDER — EMTRICITAB-RILPIVIR-TENOFOV AF 200-25-25 MG PO TABS: 1.0000 | ORAL_TABLET | Freq: Every day | ORAL | Status: DC

## 2015-10-04 NOTE — Assessment & Plan Note (Signed)
Will check her liver u/s Not clear if this is drug or HIV, or other?

## 2015-10-04 NOTE — Assessment & Plan Note (Signed)
Spoke with pt and spouse- will get him tested. Consider PREP They have condoms Will change her ART to [facility] She is taking with food Will not be a problem with pregnancy rtc in 6 months

## 2015-10-04 NOTE — Progress Notes (Signed)
   Subjective:    Patient ID: Catherine Grimes, female    DOB: 09-Jun-1981, 35 y.o.   MRN: [id number]  HPI 35 yo F with hx of HIV+, Taking odefsy, no other meds.   HIV 1 RNA QUANT (copies/mL)  Date Value  09/06/2015 <20  04/26/2015 <20  10/31/2014 <20   CD4 T CELL ABS (/uL)  Date Value  09/06/2015 1100  04/26/2015 860  10/31/2014 790   Has been having AM cough. No fevers, temp 99.8 today. No rhinnorhea. + cough. Non-productive.  At prev visit was referred to GYN for irregular menses. She was seen and was given rx. Has returned to having nl periods when she takes them (every 8 weeks).  She has had increased LFTs for last 3 labs.  Review of Systems  Constitutional: Negative for appetite change and unexpected weight change.  Gastrointestinal: Negative for diarrhea and constipation.  Genitourinary: Negative for difficulty urinating and menstrual problem.  husband last test was 3 years ago negative.      Objective:   Physical Exam  Constitutional: She appears well-developed and well-nourished.  HENT:  Mouth/Throat: No oropharyngeal exudate.  Eyes: EOM are normal. Pupils are equal, round, and reactive to light.  Neck: Neck supple.  Musculoskeletal: She exhibits no edema.  Lymphadenopathy:    She has no cervical adenopathy.      Assessment & Plan:

## 2015-10-05 ENCOUNTER — Telehealth: Payer: Self-pay | Admitting: *Deleted

## 2015-10-05 NOTE — Telephone Encounter (Signed)
Can you find husband's result from yesterday? rapid HIV If he is negative, he can speak with [facility] regarding options for PReP thanks

## 2015-10-05 NOTE — Telephone Encounter (Signed)
RN spoke with Catherine Grimes in the lab.  The husband rapid HIV test results were negative.  I will send this message to M. Pham to contact the husband.  The interpreter who is working with this family is Verdis Frederickson.  Her work phone number [phone] to contact for translation.

## 2015-10-05 NOTE — Telephone Encounter (Signed)
Pt has question about "new" HIV medication.  Does she stop the previous one and start the new one?  RN advised that the pt should STOP Complera and start [place].  Pt also asked about her husband receiving medication to keep him from becoming HIV+.  Pt shared that Dr. Johnnye Sima had talked about this.  RN asked the pt if he has insurance and the answer was no.  RN will send this question to Dr. Johnnye Sima for an answer.

## 2015-10-13 ENCOUNTER — Ambulatory Visit (INDEPENDENT_AMBULATORY_CARE_PROVIDER_SITE_OTHER): Payer: Self-pay | Admitting: *Deleted

## 2015-10-13 DIAGNOSIS — Z124 Encounter for screening for malignant neoplasm of cervix: Secondary | ICD-10-CM

## 2015-10-13 DIAGNOSIS — Z113 Encounter for screening for infections with a predominantly sexual mode of transmission: Secondary | ICD-10-CM

## 2015-10-13 NOTE — Progress Notes (Addendum)
  Subjective:     Catherine Grimes is a 35 y.o. woman who comes in today for a  pap smear only. Previous abnormal Pap smears: no. Contraception: Condoms.  Pt shared that she is having irregular menstrual periods.  Pt also shared that she is interested in having another child within the next year.  She has a 5-year-old girl at home.  Objective:    LMP 09/21/2015 Pelvic Exam:  Pap smear obtained.   Assessment:    Screening pap smear.   Plan:    Follow up in one year, or as indicated by Pap results.  Pt given condoms.  ASCUS results without HR HPV.  Per Dr. Johnnye Sima will recheck PAP in 6 months.

## 2015-10-13 NOTE — Patient Instructions (Signed)
Your results will be ready in about a week.  I will mail them to you.  Thank you for coming to the Center for your care.  Boots Mcglown,  RN 

## 2015-10-16 LAB — CYTOLOGY - PAP

## 2015-10-16 LAB — CERVICOVAGINAL ANCILLARY ONLY
CHLAMYDIA, DNA PROBE: NEGATIVE
Neisseria Gonorrhea: NEGATIVE

## 2015-10-18 ENCOUNTER — Encounter: Payer: Self-pay | Admitting: *Deleted

## 2015-10-19 ENCOUNTER — Ambulatory Visit (HOSPITAL_COMMUNITY)
Admission: RE | Admit: 2015-10-19 | Discharge: 2015-10-19 | Disposition: A | Discharge status: Home / Self Care | Payer: Self-pay | Source: Ambulatory Visit | Attending: Infectious Diseases | Admitting: Infectious Diseases

## 2015-10-19 DIAGNOSIS — N281 Cyst of kidney, acquired: Secondary | ICD-10-CM | POA: Insufficient documentation

## 2015-10-19 DIAGNOSIS — R932 Abnormal findings on diagnostic imaging of liver and biliary tract: Secondary | ICD-10-CM | POA: Insufficient documentation

## 2015-10-19 DIAGNOSIS — K759 Inflammatory liver disease, unspecified: Secondary | ICD-10-CM | POA: Insufficient documentation

## 2016-02-13 ENCOUNTER — Ambulatory Visit: Payer: Self-pay

## 2016-02-19 ENCOUNTER — Ambulatory Visit: Payer: Self-pay

## 2016-02-27 ENCOUNTER — Encounter: Payer: Self-pay | Admitting: Infectious Diseases

## 2016-04-22 ENCOUNTER — Other Ambulatory Visit (INDEPENDENT_AMBULATORY_CARE_PROVIDER_SITE_OTHER): Payer: Self-pay

## 2016-04-22 DIAGNOSIS — B20 Human immunodeficiency virus [HIV] disease: Secondary | ICD-10-CM

## 2016-04-22 DIAGNOSIS — Z113 Encounter for screening for infections with a predominantly sexual mode of transmission: Secondary | ICD-10-CM

## 2016-04-22 LAB — COMPREHENSIVE METABOLIC PANEL
ALBUMIN: 4.2 g/dL (ref 3.6–5.1)
ALT: 26 U/L (ref 6–29)
AST: 22 U/L (ref 10–30)
Alkaline Phosphatase: 57 U/L (ref 33–115)
BILIRUBIN TOTAL: 0.3 mg/dL (ref 0.2–1.2)
BUN: 13 mg/dL (ref 7–25)
CO2: 26 mmol/L (ref 20–31)
CREATININE: 0.7 mg/dL (ref 0.50–1.10)
Calcium: 9.4 mg/dL (ref 8.6–10.2)
Chloride: 103 mmol/L (ref 98–110)
GLUCOSE: 87 mg/dL (ref 65–99)
Potassium: 4.2 mmol/L (ref 3.5–5.3)
SODIUM: 137 mmol/L (ref 135–146)
Total Protein: 7 g/dL (ref 6.1–8.1)

## 2016-04-22 LAB — CBC
HEMATOCRIT: 38 % (ref 35.0–45.0)
Hemoglobin: 13 g/dL (ref 11.7–15.5)
MCH: 30.2 pg (ref 27.0–33.0)
MCHC: 34.2 g/dL (ref 32.0–36.0)
MCV: 88.2 fL (ref 80.0–100.0)
MPV: 9.8 fL (ref 7.5–12.5)
Platelets: 270 10*3/uL (ref 140–400)
RBC: 4.31 MIL/uL (ref 3.80–5.10)
RDW: 12.8 % (ref 11.0–15.0)
WBC: 7 10*3/uL (ref 3.8–10.8)

## 2016-04-23 LAB — URINE CYTOLOGY ANCILLARY ONLY
CHLAMYDIA, DNA PROBE: POSITIVE — AB
NEISSERIA GONORRHEA: NEGATIVE

## 2016-04-23 LAB — HIV-1 RNA QUANT-NO REFLEX-BLD
HIV 1 RNA Quant: <20 copies/mL (ref <20)
HIV-1 RNA Quant, Log: <1.3 Log copies/mL (ref <1.30)

## 2016-04-23 LAB — T-HELPER CELL (CD4) - (RCID CLINIC ONLY)
CD4 % Helper T Cell: 46 % (ref 33–55)
CD4 T Cell Abs: 1150 /uL (ref 400–2700)

## 2016-04-23 LAB — RPR

## 2016-04-24 ENCOUNTER — Telehealth: Payer: Self-pay | Admitting: *Deleted

## 2016-04-24 ENCOUNTER — Other Ambulatory Visit: Payer: Self-pay | Admitting: *Deleted

## 2016-04-24 MED ORDER — AZITHROMYCIN 500 MG PO TABS: 1000.0000 mg | ORAL_TABLET | Freq: Once | ORAL | 0 refills | Status: AC

## 2016-04-24 NOTE — Telephone Encounter (Signed)
Patient notified of positive Chlamydia and per Dr. Johnnye Sima 1 gram of azithromycin has been sent to [place]. Catherine Grimes

## 2016-05-06 ENCOUNTER — Ambulatory Visit (INDEPENDENT_AMBULATORY_CARE_PROVIDER_SITE_OTHER): Payer: Self-pay | Admitting: Infectious Diseases

## 2016-05-06 ENCOUNTER — Encounter: Payer: Self-pay | Admitting: Infectious Diseases

## 2016-05-06 VITALS — BP 95/64 | HR 67 | Temp 98.6°F | Ht 62.0 in | Wt 174.0 lb

## 2016-05-06 DIAGNOSIS — K759 Inflammatory liver disease, unspecified: Secondary | ICD-10-CM

## 2016-05-06 DIAGNOSIS — N912 Amenorrhea, unspecified: Secondary | ICD-10-CM

## 2016-05-06 DIAGNOSIS — Z113 Encounter for screening for infections with a predominantly sexual mode of transmission: Secondary | ICD-10-CM

## 2016-05-06 DIAGNOSIS — B20 Human immunodeficiency virus [HIV] disease: Secondary | ICD-10-CM

## 2016-05-06 DIAGNOSIS — N926 Irregular menstruation, unspecified: Secondary | ICD-10-CM

## 2016-05-06 DIAGNOSIS — K134 Granuloma and granuloma-like lesions of oral mucosa: Secondary | ICD-10-CM

## 2016-05-06 DIAGNOSIS — Z79899 Other long term (current) drug therapy: Secondary | ICD-10-CM

## 2016-05-06 NOTE — Assessment & Plan Note (Signed)
She is doing very well Needs dental F/u.  Gets flu today Husband will come for PReP appt rx for husband to be treated for chlamydia given.  Offered/refused condoms.  rtc in 6 months

## 2016-05-06 NOTE — Assessment & Plan Note (Signed)
Resolved

## 2016-05-06 NOTE — Progress Notes (Signed)
   Subjective:    Patient ID: Catherine Grimes, female    DOB: Aug 20, 1980, 35 y.o.   MRN: [id number]  HPI  35 yo F with hx of HIV+, Taking odefsy, no other meds.  Has hx of elevated LFTs- last lab showed u/s consistent with hepatic steatosis, small left renal cyst.  Current labs show LFTs normal.  Has not had GYN f/u since august due to abn cycles. Prev on provera (10 day course every 8 weeks. Last course was in March). Has been off No problems with ART.  Has feeling well.   HIV 1 RNA Quant (copies/mL)  Date Value  04/22/2016 <20  09/06/2015 <20  04/26/2015 <20   CD4 T Cell Abs (/uL)  Date Value  04/22/2016 1,150  09/06/2015 1,100  04/26/2015 860     Review of Systems  Constitutional: Negative for appetite change and unexpected weight change.  Gastrointestinal: Negative for constipation and diarrhea.  Genitourinary: Negative for difficulty urinating and menstrual problem.      Objective:   Physical Exam  Constitutional: She appears well-developed and well-nourished.  HENT:  Mouth/Throat: No oropharyngeal exudate.  Eyes: EOM are normal. Pupils are equal, round, and reactive to light.  Neck: Neck supple.  Cardiovascular: Normal rate, regular rhythm and normal heart sounds.   Pulmonary/Chest: Effort normal and breath sounds normal.  Abdominal: Soft. Bowel sounds are normal. There is no tenderness. There is no rebound.  Musculoskeletal: She exhibits no edema.  Lymphadenopathy:    She has no cervical adenopathy.          Assessment & Plan:

## 2016-05-06 NOTE — Assessment & Plan Note (Signed)
LFTs currently normal.  Will watch.  ? Hepatic steatosis

## 2016-05-06 NOTE — Assessment & Plan Note (Signed)
Will get her f/u with GYN.

## 2016-07-26 NOTE — Addendum Note (Signed)
Addended by: Landis Gandy on: 07/26/2016 04:16 PM   Modules accepted: Orders

## 2016-08-07 ENCOUNTER — Ambulatory Visit: Payer: Self-pay

## 2016-09-13 ENCOUNTER — Encounter: Payer: Self-pay | Admitting: Obstetrics & Gynecology

## 2016-09-17 ENCOUNTER — Encounter: Payer: Self-pay | Admitting: Infectious Diseases

## 2016-09-21 ENCOUNTER — Other Ambulatory Visit: Payer: Self-pay | Admitting: Infectious Diseases

## 2016-09-21 DIAGNOSIS — B20 Human immunodeficiency virus [HIV] disease: Secondary | ICD-10-CM

## 2016-10-21 ENCOUNTER — Other Ambulatory Visit: Payer: Self-pay

## 2016-10-23 ENCOUNTER — Other Ambulatory Visit (INDEPENDENT_AMBULATORY_CARE_PROVIDER_SITE_OTHER): Payer: Self-pay

## 2016-10-23 DIAGNOSIS — B20 Human immunodeficiency virus [HIV] disease: Secondary | ICD-10-CM

## 2016-10-23 DIAGNOSIS — Z79899 Other long term (current) drug therapy: Secondary | ICD-10-CM

## 2016-10-23 DIAGNOSIS — Z113 Encounter for screening for infections with a predominantly sexual mode of transmission: Secondary | ICD-10-CM

## 2016-10-23 LAB — CBC
HEMATOCRIT: 39.2 % (ref 35.0–45.0)
Hemoglobin: 13.4 g/dL (ref 11.7–15.5)
MCH: 29.9 pg (ref 27.0–33.0)
MCHC: 34.2 g/dL (ref 32.0–36.0)
MCV: 87.5 fL (ref 80.0–100.0)
MPV: 9.7 fL (ref 7.5–12.5)
Platelets: 265 10*3/uL (ref 140–400)
RBC: 4.48 MIL/uL (ref 3.80–5.10)
RDW: 13.7 % (ref 11.0–15.0)
WBC: 7.4 10*3/uL (ref 3.8–10.8)

## 2016-10-24 LAB — URINE CYTOLOGY ANCILLARY ONLY
CHLAMYDIA, DNA PROBE: NEGATIVE
Neisseria Gonorrhea: NEGATIVE

## 2016-10-24 LAB — LIPID PANEL
CHOL/HDL RATIO: 5 ratio — AB (ref <5.0)
Cholesterol: 144 mg/dL (ref <200)
HDL: 29 mg/dL — AB (ref >50)
TRIGLYCERIDES: 411 mg/dL — AB (ref <150)

## 2016-10-24 LAB — COMPREHENSIVE METABOLIC PANEL
ALBUMIN: 4.1 g/dL (ref 3.6–5.1)
ALT: 52 U/L — AB (ref 6–29)
AST: 33 U/L — ABNORMAL HIGH (ref 10–30)
Alkaline Phosphatase: 70 U/L (ref 33–115)
BUN: 13 mg/dL (ref 7–25)
CALCIUM: 9.3 mg/dL (ref 8.6–10.2)
CHLORIDE: 103 mmol/L (ref 98–110)
CO2: 22 mmol/L (ref 20–31)
Creat: 0.67 mg/dL (ref 0.50–1.10)
Glucose, Bld: 91 mg/dL (ref 65–99)
POTASSIUM: 3.9 mmol/L (ref 3.5–5.3)
Sodium: 137 mmol/L (ref 135–146)
Total Bilirubin: 0.3 mg/dL (ref 0.2–1.2)
Total Protein: 7.2 g/dL (ref 6.1–8.1)

## 2016-10-24 LAB — T-HELPER CELL (CD4) - (RCID CLINIC ONLY)
CD4 T CELL ABS: 1230 /uL (ref 400–2700)
CD4 T CELL HELPER: 47 % (ref 33–55)

## 2016-10-24 LAB — RPR

## 2016-10-25 LAB — HIV-1 RNA QUANT-NO REFLEX-BLD
HIV 1 RNA Quant: <20 copies/mL
HIV-1 RNA Quant, Log: <1.3 Log copies/mL

## 2016-10-31 ENCOUNTER — Telehealth: Payer: Self-pay | Admitting: *Deleted

## 2016-10-31 NOTE — Telephone Encounter (Signed)
Patient unable to make her Monday morning appointment due to work. She is compliant, well controlled. She would like to come Wednesday at 2:00. Please advise who could see her, relay results.  Patient will need interpreter. Landis Gandy, RN

## 2016-10-31 NOTE — Telephone Encounter (Signed)
Glad to see her whenever she can make appt

## 2016-11-04 ENCOUNTER — Ambulatory Visit: Payer: Self-pay | Admitting: Infectious Diseases

## 2016-11-06 ENCOUNTER — Ambulatory Visit (INDEPENDENT_AMBULATORY_CARE_PROVIDER_SITE_OTHER): Payer: Self-pay | Admitting: Pharmacist Clinician (PhC)/ Clinical Pharmacy Specialist

## 2016-11-06 DIAGNOSIS — B20 Human immunodeficiency virus [HIV] disease: Secondary | ICD-10-CM

## 2016-11-06 DIAGNOSIS — Z23 Encounter for immunization: Secondary | ICD-10-CM

## 2016-11-06 MED ORDER — EMTRICITAB-RILPIVIR-TENOFOV AF 200-25-25 MG PO TABS: 1.0000 | ORAL_TABLET | Freq: Every day | ORAL | 2 refills | Status: DC

## 2016-11-06 NOTE — Progress Notes (Signed)
HPI: Catherine Grimes is a 36 y.o. female who is here to see pharmacy because she couldn't see Dr. Johnnye Sima due to work.  Allergies: No Known Allergies  Vitals:    Past Medical History: Past Medical History:  Diagnosis Date  . HIV infection ([place])   . Infection    HIV    Social History: Social History   Social History  . Marital status: Married    Spouse name: N/A  . Number of children: N/A  . Years of education: N/A   Social History Main Topics  . Smoking status: Never Smoker  . Smokeless tobacco: Never Used  . Alcohol use No  . Drug use: No  . Sexual activity: Yes    Partners: Male    Birth control/ protection: Condom     Comment: accepted condoms   Other Topics Concern  . Not on file   Social History Narrative  . No narrative on file    Previous Regimen: Complera  Current Regimen: Odefsey  Labs: HIV 1 RNA Quant (copies/mL)  Date Value  10/23/2016 <20 NOT DETECTED  04/22/2016 <20  09/06/2015 <20   CD4 T Cell Abs (/uL)  Date Value  10/23/2016 1,230  04/22/2016 1,150  09/06/2015 1,100   Hep B S Ab (no units)  Date Value  10/05/2012 NONREACTIVE   Hepatitis B Surface Ag (no units)  Date Value  08/27/2011 NEGATIVE   HCV Ab (no units)  Date Value  12/12/2009 NEG    CrCl: CrCl cannot be calculated (Unknown ideal weight.).  Lipids:    Component Value Date/Time   CHOL 144 10/23/2016 1358   TRIG 411 (H) 10/23/2016 1358   HDL 29 (L) 10/23/2016 1358   CHOLHDL 5.0 (H) 10/23/2016 1358   VLDL NOT CALC 10/23/2016 1358   LDLCALC NOT CALC 10/23/2016 1358    Assessment: Catherine Grimes is doing fantastic on her Odefsey. She has been followed by Dr. Johnnye Sima but due to her work schedule she couldn't do recently. All of her labs we great about 2 wks ago. She doesn't miss doses of her medication. After talking to her, I remember that we are giving her husband Truvada for PreP because they are trying to have a baby. She is still not pregnant today but  they are still trying. She has not gotten the meningitis series so we will give her one today and bring her back in 2 months for the second shot. Tried to schedule her for a f/u with Dr. Johnnye Sima in 3 mo but his schedule is not out that far yet. When she comes back for the second vaccine, I'll schedule it then. She is a very easy pt to deal with.   Recommendations:  Send more refills for [facility] Menveo today F/u in 2 mo for the second shot Schedule with Dr. Johnnye Sima at the next visit  Onnie Boer, PharmD, BCPS, AAHIVP, CPP Clinical Infectious [place] [facility] 11/06/2016, 2:11 PM

## 2017-01-08 ENCOUNTER — Ambulatory Visit (INDEPENDENT_AMBULATORY_CARE_PROVIDER_SITE_OTHER): Payer: Self-pay | Admitting: Pharmacist Clinician (PhC)/ Clinical Pharmacy Specialist

## 2017-01-08 DIAGNOSIS — Z23 Encounter for immunization: Secondary | ICD-10-CM

## 2017-01-08 DIAGNOSIS — B20 Human immunodeficiency virus [HIV] disease: Secondary | ICD-10-CM

## 2017-01-08 NOTE — Progress Notes (Signed)
HPI: Catherine Grimes is a 36 y.o. female who is here for her pharmacy follow up of her HIV and vaccine.   Allergies: No Known Allergies  Vitals:    Past Medical History: Past Medical History:  Diagnosis Date  . HIV infection ([place])   . Infection    HIV    Social History: Social History   Social History  . Marital status: Married    Spouse name: N/A  . Number of children: N/A  . Years of education: N/A   Social History Main Topics  . Smoking status: Never Smoker  . Smokeless tobacco: Never Used  . Alcohol use No  . Drug use: No  . Sexual activity: Yes    Partners: Male    Birth control/ protection: Condom     Comment: accepted condoms   Other Topics Concern  . Not on file   Social History Narrative  . No narrative on file    Previous Regimen: Complera  Current Regimen: Odefsey  Labs: HIV 1 RNA Quant (copies/mL)  Date Value  10/23/2016 <20 NOT DETECTED  04/22/2016 <20  09/06/2015 <20   CD4 T Cell Abs (/uL)  Date Value  10/23/2016 1,230  04/22/2016 1,150  09/06/2015 1,100   Hep B S Ab (no units)  Date Value  10/05/2012 NONREACTIVE   Hepatitis B Surface Ag (no units)  Date Value  08/27/2011 NEGATIVE   HCV Ab (no units)  Date Value  12/12/2009 NEG    CrCl: CrCl cannot be calculated (Patient's most recent lab result is older than the maximum 21 days allowed.).  Lipids:    Component Value Date/Time   CHOL 144 10/23/2016 1358   TRIG 411 (H) 10/23/2016 1358   HDL 29 (L) 10/23/2016 1358   CHOLHDL 5.0 (H) 10/23/2016 1358   VLDL NOT CALC 10/23/2016 1358   LDLCALC NOT CALC 10/23/2016 1358    Assessment: Catherine Grimes is doing fantastic on her current regimen. She hasn't missed any doses or having any side effects. She is here for her second Menveo vaccine. In the past, she has had some trouble scheduling with Dr. Johnnye Sima because she can only come in on Wed afternoon. I scheduled her to see Dr. Johnnye Sima in December. Her  VL in March was great.    Recommendations:  Second Menveo Cont Odefsey 1 PO qday F/u with Dr. Johnnye Sima in December  Minh Pham, PharmD, BCPS, [place], [place] for Infectious Disease 01/08/2017, 3:27 PM

## 2017-02-12 ENCOUNTER — Ambulatory Visit: Payer: Self-pay

## 2017-02-26 ENCOUNTER — Encounter: Payer: Self-pay | Admitting: Infectious Diseases

## 2017-07-16 ENCOUNTER — Ambulatory Visit (INDEPENDENT_AMBULATORY_CARE_PROVIDER_SITE_OTHER): Payer: Self-pay | Admitting: Infectious Diseases

## 2017-07-16 VITALS — BP 103/64 | HR 88 | Temp 98.7°F | Wt 174.0 lb

## 2017-07-16 DIAGNOSIS — Z113 Encounter for screening for infections with a predominantly sexual mode of transmission: Secondary | ICD-10-CM

## 2017-07-16 DIAGNOSIS — B20 Human immunodeficiency virus [HIV] disease: Secondary | ICD-10-CM

## 2017-07-16 DIAGNOSIS — Z79899 Other long term (current) drug therapy: Secondary | ICD-10-CM

## 2017-07-16 DIAGNOSIS — N926 Irregular menstruation, unspecified: Secondary | ICD-10-CM

## 2017-07-16 LAB — CBC
HCT: 37.1 % (ref 35.0–45.0)
Hemoglobin: 12.9 g/dL (ref 11.7–15.5)
MCH: 30.1 pg (ref 27.0–33.0)
MCHC: 34.8 g/dL (ref 32.0–36.0)
MCV: 86.5 fL (ref 80.0–100.0)
MPV: 10.2 fL (ref 7.5–12.5)
PLATELETS: 305 10*3/uL (ref 140–400)
RBC: 4.29 10*6/uL (ref 3.80–5.10)
RDW: 12.8 % (ref 11.0–15.0)
WBC: 8.4 10*3/uL (ref 3.8–10.8)

## 2017-07-16 LAB — COMPREHENSIVE METABOLIC PANEL
AG RATIO: 1.6 (calc) (ref 1.0–2.5)
ALT: 92 U/L — AB (ref 6–29)
AST: 55 U/L — AB (ref 10–30)
Albumin: 4.4 g/dL (ref 3.6–5.1)
Alkaline phosphatase (APISO): 79 U/L (ref 33–115)
BILIRUBIN TOTAL: 0.5 mg/dL (ref 0.2–1.2)
BUN: 10 mg/dL (ref 7–25)
CALCIUM: 9.6 mg/dL (ref 8.6–10.2)
CHLORIDE: 101 mmol/L (ref 98–110)
CO2: 29 mmol/L (ref 20–32)
Creat: 0.63 mg/dL (ref 0.50–1.10)
Globulin: 2.8 g/dL (calc) (ref 1.9–3.7)
Glucose, Bld: 80 mg/dL (ref 65–99)
Potassium: 4.1 mmol/L (ref 3.5–5.3)
SODIUM: 136 mmol/L (ref 135–146)
Total Protein: 7.2 g/dL (ref 6.1–8.1)

## 2017-07-16 NOTE — Assessment & Plan Note (Signed)
She is doing well Husband on prep per pt Is currently trying to get pregnant.  Has gotten flu shot. Other vax up to date Labs today.  Will see her back in 9 months.

## 2017-07-16 NOTE — Assessment & Plan Note (Signed)
Will get her back in with GYN °

## 2017-07-16 NOTE — Progress Notes (Signed)
   Subjective:    Patient ID: Catherine Grimes, female    DOB: 1981/03/06, 36 y.o.   MRN: [id number]  HPI 36 yo F who is HIV+, hepatic steatosis. She has been on odefsy, her first and only rx.  She also has a hx of abn menses.She has had f/u with GYN (missed last visit).  Last menses was 11-25, heavy.  She has only missed 1 dose since last visit.  Husband was started on PREP.   HIV 1 RNA Quant (copies/mL)  Date Value  10/23/2016 <20 NOT DETECTED  04/22/2016 <20  09/06/2015 <20   CD4 T Cell Abs (/uL)  Date Value  10/23/2016 1,230  04/22/2016 1,150  09/06/2015 1,100     Review of Systems  Constitutional: Negative for appetite change and unexpected weight change.  Respiratory: Negative for cough and shortness of breath.   Gastrointestinal: Negative for constipation and diarrhea.  Genitourinary: Positive for menstrual problem. Negative for difficulty urinating.  Neurological: Negative for headaches.  Psychiatric/Behavioral: Negative for sleep disturbance.  exercises sometimes. Is not clear about diet to lose wt (does not like vegetables).  Please see HPI. All other systems reviewed and negative.     Objective:   Physical Exam  Constitutional: She appears well-developed and well-nourished.  HENT:  Mouth/Throat: No oropharyngeal exudate.  Eyes: EOM are normal. Pupils are equal, round, and reactive to light.  Neck: Neck supple.  Cardiovascular: Normal rate, regular rhythm and normal heart sounds.  Pulmonary/Chest: Effort normal and breath sounds normal.  Abdominal: Soft. Bowel sounds are normal. There is no tenderness. There is no rebound.  Musculoskeletal: She exhibits no edema.  Lymphadenopathy:    She has no cervical adenopathy.      Assessment & Plan:

## 2017-07-17 LAB — T-HELPER CELL (CD4) - (RCID CLINIC ONLY)
CD4 % Helper T Cell: 45 % (ref 33–55)
CD4 T Cell Abs: 1040 /uL (ref 400–2700)

## 2017-07-18 LAB — HIV-1 RNA QUANT-NO REFLEX-BLD
HIV 1 RNA Quant: <20 copies/mL — AB
HIV-1 RNA Quant, Log: <1.3 Log copies/mL — AB

## 2017-11-05 ENCOUNTER — Telehealth: Payer: Self-pay

## 2017-11-05 DIAGNOSIS — B20 Human immunodeficiency virus [HIV] disease: Secondary | ICD-10-CM

## 2017-11-05 MED ORDER — EMTRICITAB-RILPIVIR-TENOFOV AF 200-25-25 MG PO TABS: 1.0000 | ORAL_TABLET | Freq: Every day | ORAL | 2 refills | Status: DC

## 2017-11-05 NOTE — Telephone Encounter (Signed)
Script for patient assistance.   Odefsey.    Laverle Patter, RN

## 2017-11-06 ENCOUNTER — Encounter: Payer: Self-pay | Admitting: Infectious Diseases

## 2017-11-10 ENCOUNTER — Other Ambulatory Visit: Payer: Self-pay | Admitting: Pharmacist Clinician (PhC)/ Clinical Pharmacy Specialist

## 2017-11-10 DIAGNOSIS — B20 Human immunodeficiency virus [HIV] disease: Secondary | ICD-10-CM

## 2017-11-10 MED ORDER — EMTRICITAB-RILPIVIR-TENOFOV AF 200-25-25 MG PO TABS: 1.0000 | ORAL_TABLET | Freq: Every day | ORAL | 2 refills | Status: DC

## 2017-11-10 NOTE — Progress Notes (Signed)
Send to [employer]. Approved through [employer] for now.

## 2017-11-11 ENCOUNTER — Other Ambulatory Visit: Payer: Self-pay | Admitting: *Deleted

## 2017-11-11 DIAGNOSIS — B20 Human immunodeficiency virus [HIV] disease: Secondary | ICD-10-CM

## 2017-11-11 MED ORDER — EMTRICITAB-RILPIVIR-TENOFOV AF 200-25-25 MG PO TABS: 1.0000 | ORAL_TABLET | Freq: Every day | ORAL | 2 refills | Status: DC

## 2017-11-11 NOTE — Progress Notes (Signed)
Patient assistance application. Landis Gandy, RN

## 2018-02-27 ENCOUNTER — Encounter: Payer: Self-pay | Admitting: Infectious Diseases

## 2018-04-15 ENCOUNTER — Other Ambulatory Visit: Payer: Self-pay

## 2018-04-29 ENCOUNTER — Other Ambulatory Visit: Payer: Self-pay

## 2018-04-29 ENCOUNTER — Ambulatory Visit: Payer: Self-pay | Admitting: Infectious Diseases

## 2018-04-29 DIAGNOSIS — Z79899 Other long term (current) drug therapy: Secondary | ICD-10-CM

## 2018-04-29 DIAGNOSIS — B20 Human immunodeficiency virus [HIV] disease: Secondary | ICD-10-CM

## 2018-04-29 DIAGNOSIS — Z113 Encounter for screening for infections with a predominantly sexual mode of transmission: Secondary | ICD-10-CM

## 2018-04-30 LAB — T-HELPER CELL (CD4) - (RCID CLINIC ONLY)
CD4 T CELL ABS: 1010 /uL (ref 400–2700)
CD4 T CELL HELPER: 43 % (ref 33–55)

## 2018-04-30 LAB — URINE CYTOLOGY ANCILLARY ONLY
Chlamydia: NEGATIVE
Neisseria Gonorrhea: NEGATIVE

## 2018-05-01 LAB — LIPID PANEL
CHOLESTEROL: 183 mg/dL (ref <200)
HDL: 51 mg/dL (ref >50)
LDL CHOLESTEROL (CALC): 108 mg/dL — AB
Non-HDL Cholesterol (Calc): 132 mg/dL (calc) — ABNORMAL HIGH (ref <130)
Total CHOL/HDL Ratio: 3.6 (calc) (ref <5.0)
Triglycerides: 129 mg/dL (ref <150)

## 2018-05-01 LAB — HIV-1 RNA QUANT-NO REFLEX-BLD
HIV 1 RNA QUANT: NOT DETECTED {copies}/mL
HIV-1 RNA Quant, Log: <1.3 Log copies/mL

## 2018-05-01 LAB — CBC
HCT: 39.2 % (ref 35.0–45.0)
Hemoglobin: 13.6 g/dL (ref 11.7–15.5)
MCH: 30.1 pg (ref 27.0–33.0)
MCHC: 34.7 g/dL (ref 32.0–36.0)
MCV: 86.7 fL (ref 80.0–100.0)
MPV: 10.2 fL (ref 7.5–12.5)
PLATELETS: 322 10*3/uL (ref 140–400)
RBC: 4.52 10*6/uL (ref 3.80–5.10)
RDW: 12.5 % (ref 11.0–15.0)
WBC: 7.9 10*3/uL (ref 3.8–10.8)

## 2018-05-01 LAB — COMPREHENSIVE METABOLIC PANEL
AG Ratio: 1.6 (calc) (ref 1.0–2.5)
ALT: 17 U/L (ref 6–29)
AST: 17 U/L (ref 10–30)
Albumin: 4.5 g/dL (ref 3.6–5.1)
Alkaline phosphatase (APISO): 67 U/L (ref 33–115)
BILIRUBIN TOTAL: 0.5 mg/dL (ref 0.2–1.2)
BUN: 11 mg/dL (ref 7–25)
CALCIUM: 9.7 mg/dL (ref 8.6–10.2)
CHLORIDE: 103 mmol/L (ref 98–110)
CO2: 26 mmol/L (ref 20–32)
Creat: 0.86 mg/dL (ref 0.50–1.10)
GLOBULIN: 2.8 g/dL (ref 1.9–3.7)
GLUCOSE: 85 mg/dL (ref 65–99)
POTASSIUM: 4.1 mmol/L (ref 3.5–5.3)
SODIUM: 138 mmol/L (ref 135–146)
TOTAL PROTEIN: 7.3 g/dL (ref 6.1–8.1)

## 2018-05-01 LAB — RPR: RPR: NONREACTIVE

## 2018-05-13 ENCOUNTER — Ambulatory Visit (INDEPENDENT_AMBULATORY_CARE_PROVIDER_SITE_OTHER): Payer: Self-pay | Admitting: Infectious Diseases

## 2018-05-13 ENCOUNTER — Encounter: Payer: Self-pay | Admitting: Infectious Diseases

## 2018-05-13 VITALS — BP 103/71 | HR 57 | Temp 97.8°F | Ht 62.0 in | Wt 177.0 lb

## 2018-05-13 DIAGNOSIS — Z79899 Other long term (current) drug therapy: Secondary | ICD-10-CM

## 2018-05-13 DIAGNOSIS — Z113 Encounter for screening for infections with a predominantly sexual mode of transmission: Secondary | ICD-10-CM

## 2018-05-13 DIAGNOSIS — B20 Human immunodeficiency virus [HIV] disease: Secondary | ICD-10-CM

## 2018-05-13 DIAGNOSIS — Z23 Encounter for immunization: Secondary | ICD-10-CM

## 2018-05-13 DIAGNOSIS — K134 Granuloma and granuloma-like lesions of oral mucosa: Secondary | ICD-10-CM

## 2018-05-13 DIAGNOSIS — N926 Irregular menstruation, unspecified: Secondary | ICD-10-CM

## 2018-05-13 NOTE — Assessment & Plan Note (Signed)
No recurrence. 

## 2018-05-13 NOTE — Progress Notes (Signed)
   Subjective:    Patient ID: Catherine Grimes, female    DOB: Apr 22, 1981, 37 y.o.   MRN: [id number]  HPI 37 yo F who is HIV+, hepatic steatosis. She has been on odefsy, her first and only rx. Has had no difficulty taking, missed only 1 day.  She also has a hx of abn menses.She needs f/u with GYN.  Prev was interested in getting pregnant, not pregnant yet.  She got divorced 1 year ago.  daughter is doing well.   HIV 1 RNA Quant (copies/mL)  Date Value  04/29/2018 <20 NOT DETECTED  07/16/2017 <20 DETECTED (A)  10/23/2016 <20 NOT DETECTED   CD4 T Cell Abs (/uL)  Date Value  04/29/2018 1,010  07/16/2017 1,040  10/23/2016 1,230    Review of Systems  Constitutional: Negative for appetite change and unexpected weight change.  Respiratory: Negative for shortness of breath.   Gastrointestinal: Negative for constipation and diarrhea.  Genitourinary: Positive for menstrual problem. Negative for difficulty urinating.  Working on losing wt, has lost 5#.  Please see HPI. All other systems reviewed and negative.     Objective:   Physical Exam  Constitutional: She appears well-developed and well-nourished.  HENT:  Mouth/Throat: No oropharyngeal exudate.  Eyes: Pupils are equal, round, and reactive to light. EOM are normal.  Neck: Normal range of motion. Neck supple.  Cardiovascular: Normal rate, regular rhythm and normal heart sounds.  Pulmonary/Chest: Effort normal and breath sounds normal.  Abdominal: Soft. Bowel sounds are normal. There is no tenderness. There is no guarding.  Musculoskeletal: Normal range of motion. She exhibits no edema.  Lymphadenopathy:    She has no cervical adenopathy.  Skin: Skin is warm and dry.  Psychiatric: She has a normal mood and affect.      Assessment & Plan:

## 2018-05-13 NOTE — Progress Notes (Signed)
Prevnar-13 administered today. Patient tolerated well.

## 2018-05-13 NOTE — Progress Notes (Signed)
Patient states she received her Flu vaccine at her employment. She will provide documention.

## 2018-05-13 NOTE — Addendum Note (Signed)
Addended by: Eugenia Mcalpine on: 05/13/2018 11:15 AM   Modules accepted: Orders

## 2018-05-13 NOTE — Assessment & Plan Note (Signed)
Will get her with Gyn.

## 2018-05-13 NOTE — Assessment & Plan Note (Signed)
She is doing well. Has new apt and job. She feels safe. Has help from family.  has multiple questions about getting a new partners.  gotten flu shot.  PCV 13 rtc in 9 months.

## 2018-05-27 ENCOUNTER — Encounter: Payer: Self-pay | Admitting: Family Medicine

## 2018-06-26 ENCOUNTER — Other Ambulatory Visit: Payer: Self-pay | Admitting: Infectious Diseases

## 2018-06-26 DIAGNOSIS — B20 Human immunodeficiency virus [HIV] disease: Secondary | ICD-10-CM

## 2018-06-29 ENCOUNTER — Encounter: Payer: Self-pay | Admitting: Obstetrics & Gynecology

## 2018-09-17 ENCOUNTER — Encounter: Payer: Self-pay | Admitting: Infectious Diseases

## 2019-02-08 ENCOUNTER — Other Ambulatory Visit: Payer: Self-pay

## 2019-02-08 ENCOUNTER — Ambulatory Visit: Payer: Self-pay

## 2019-02-08 DIAGNOSIS — Z113 Encounter for screening for infections with a predominantly sexual mode of transmission: Secondary | ICD-10-CM

## 2019-02-08 DIAGNOSIS — B20 Human immunodeficiency virus [HIV] disease: Secondary | ICD-10-CM

## 2019-02-08 DIAGNOSIS — Z79899 Other long term (current) drug therapy: Secondary | ICD-10-CM

## 2019-02-09 LAB — T-HELPER CELL (CD4) - (RCID CLINIC ONLY)
CD4 % Helper T Cell: 44 % (ref 33–65)
CD4 T Cell Abs: 1101 /uL (ref 400–1790)

## 2019-02-10 ENCOUNTER — Other Ambulatory Visit: Payer: Self-pay

## 2019-02-10 LAB — URINE CYTOLOGY ANCILLARY ONLY
Chlamydia: NEGATIVE
Neisseria Gonorrhea: NEGATIVE

## 2019-02-11 LAB — CBC
HCT: 38.4 % (ref 35.0–45.0)
Hemoglobin: 13.2 g/dL (ref 11.7–15.5)
MCH: 29.5 pg (ref 27.0–33.0)
MCHC: 34.4 g/dL (ref 32.0–36.0)
MCV: 85.9 fL (ref 80.0–100.0)
MPV: 10.3 fL (ref 7.5–12.5)
Platelets: 300 10*3/uL (ref 140–400)
RBC: 4.47 10*6/uL (ref 3.80–5.10)
RDW: 13.2 % (ref 11.0–15.0)
WBC: 8.2 10*3/uL (ref 3.8–10.8)

## 2019-02-11 LAB — RPR: RPR Ser Ql: NONREACTIVE

## 2019-02-11 LAB — COMPREHENSIVE METABOLIC PANEL
AG Ratio: 1.3 (calc) (ref 1.0–2.5)
ALT: 53 U/L — ABNORMAL HIGH (ref 6–29)
AST: 32 U/L — ABNORMAL HIGH (ref 10–30)
Albumin: 4.1 g/dL (ref 3.6–5.1)
Alkaline phosphatase (APISO): 71 U/L (ref 31–125)
BUN: 12 mg/dL (ref 7–25)
CO2: 29 mmol/L (ref 20–32)
Calcium: 9.6 mg/dL (ref 8.6–10.2)
Chloride: 103 mmol/L (ref 98–110)
Creat: 0.78 mg/dL (ref 0.50–1.10)
Globulin: 3.2 g/dL (calc) (ref 1.9–3.7)
Glucose, Bld: 104 mg/dL — ABNORMAL HIGH (ref 65–99)
Potassium: 4.1 mmol/L (ref 3.5–5.3)
Sodium: 140 mmol/L (ref 135–146)
Total Bilirubin: 0.4 mg/dL (ref 0.2–1.2)
Total Protein: 7.3 g/dL (ref 6.1–8.1)

## 2019-02-11 LAB — LIPID PANEL
Cholesterol: 178 mg/dL (ref <200)
HDL: 30 mg/dL — ABNORMAL LOW (ref >=50)
Non-HDL Cholesterol (Calc): 148 mg/dL (calc) — ABNORMAL HIGH (ref <130)
Total CHOL/HDL Ratio: 5.9 (calc) — ABNORMAL HIGH (ref <5.0)
Triglycerides: 583 mg/dL — ABNORMAL HIGH (ref <150)

## 2019-02-11 LAB — HIV-1 RNA QUANT-NO REFLEX-BLD
HIV 1 RNA Quant: <20 copies/mL
HIV-1 RNA Quant, Log: <1.3 Log copies/mL

## 2019-02-16 ENCOUNTER — Encounter: Payer: Self-pay | Admitting: Infectious Diseases

## 2019-03-02 ENCOUNTER — Encounter: Payer: Self-pay | Admitting: Infectious Diseases

## 2019-03-18 ENCOUNTER — Encounter: Payer: Self-pay | Admitting: Infectious Diseases

## 2019-03-18 ENCOUNTER — Ambulatory Visit (INDEPENDENT_AMBULATORY_CARE_PROVIDER_SITE_OTHER): Payer: Self-pay | Admitting: Infectious Diseases

## 2019-03-18 ENCOUNTER — Other Ambulatory Visit: Payer: Self-pay

## 2019-03-18 VITALS — BP 109/73 | HR 72 | Temp 98.2°F

## 2019-03-18 DIAGNOSIS — B20 Human immunodeficiency virus [HIV] disease: Secondary | ICD-10-CM

## 2019-03-18 DIAGNOSIS — Z79899 Other long term (current) drug therapy: Secondary | ICD-10-CM

## 2019-03-18 DIAGNOSIS — K759 Inflammatory liver disease, unspecified: Secondary | ICD-10-CM

## 2019-03-18 DIAGNOSIS — N644 Mastodynia: Secondary | ICD-10-CM

## 2019-03-18 DIAGNOSIS — N926 Irregular menstruation, unspecified: Secondary | ICD-10-CM

## 2019-03-18 DIAGNOSIS — Z113 Encounter for screening for infections with a predominantly sexual mode of transmission: Secondary | ICD-10-CM

## 2019-03-18 NOTE — Assessment & Plan Note (Signed)
She is doing very well Explained U=U to her and her wish to be pregnant. Offered Prep if she is worried.  Await availability of flu shot.  Other vax are uptodate.  rtc in 9 months.

## 2019-03-18 NOTE — Assessment & Plan Note (Signed)
Will have her seen by GYN and get mammo on her.

## 2019-03-18 NOTE — Progress Notes (Signed)
   Subjective:    Patient ID: Catherine Grimes, female    DOB: 1980/10/29, 38 y.o.   MRN: [id number]  HPI 38 yo F who is HIV+, hepatic steatosis. She has been on odefsy, her first and only rx. Has had no difficulty taking, missed only 1 day.  Missed her PAP appt. No problems.  No problems with medications.  Has had breast soreness for several months- no lumps or bumps. Worse with touching. Feels like a muscle strain. Only on L. Unchanged with movement of L arm.  Mammogram 10 yrs ago.  She would like to have a general medical MD.  28 yo daughter (who wants a brother).  Parents and sister have DM.  She is curious is she has an ovarian cyst. She has not had a period in 1 year.  Would like to have another child. Has questions about person would be father and need for Prep.     HIV 1 RNA Quant (copies/mL)  Date Value  02/08/2019 <20 NOT DETECTED  04/29/2018 <20 NOT DETECTED  07/16/2017 <20 DETECTED (A)   CD4 T Cell Abs (/uL)  Date Value  02/08/2019 1,101  04/29/2018 1,010  07/16/2017 1,040    Review of Systems  Constitutional: Negative for activity change and unexpected weight change.  Gastrointestinal: Negative for constipation and diarrhea.  Genitourinary: Positive for menstrual problem. Negative for difficulty urinating and pelvic pain.  occas has menstrual cramps but does not have menses.  Please see HPI. All other systems reviewed and negative.      Objective:   Physical Exam Constitutional:      Appearance: Normal appearance.  HENT:     Mouth/Throat:     Mouth: Mucous membranes are dry.  Eyes:     Extraocular Movements: Extraocular movements intact.     Pupils: Pupils are equal, round, and reactive to light.  Neck:     Musculoskeletal: Normal range of motion and neck supple.  Cardiovascular:     Rate and Rhythm: Normal rate and regular rhythm.  Pulmonary:     Effort: Pulmonary effort is normal.     Breath sounds: Normal breath sounds.  Abdominal:   General: Bowel sounds are normal. There is no distension.     Palpations: Abdomen is soft. There is no mass.  Musculoskeletal:     Right lower leg: No edema.     Left lower leg: No edema.  Neurological:     General: No focal deficit present.     Mental Status: She is alert.  Psychiatric:        Mood and Affect: Mood normal.       Assessment & Plan:

## 2019-03-18 NOTE — Assessment & Plan Note (Signed)
Mild increase in LFTs today. Has been normal prev.  Will continue to watch

## 2019-03-18 NOTE — Assessment & Plan Note (Signed)
Will refer her to GYN.

## 2019-03-23 ENCOUNTER — Telehealth: Payer: Self-pay | Admitting: Infectious Diseases

## 2019-03-23 NOTE — Telephone Encounter (Signed)
COVID-19 Pre-Screening Questions: ° °Do you currently have a fever (>100 °F), chills or unexplained body aches? N ° °Are you currently experiencing new cough, shortness of breath, sore throat, runny nose? N °•  °Have you recently travelled outside the state of [place] in the last 14 days? N  °•  °Have you been in contact with someone that is currently pending confirmation of Covid19 testing or has been confirmed to have the Covid19 virus?  N ° °**If the patient answers NO to ALL questions -  advise the patient to please call the clinic before coming to the office should any symptoms develop.  ° ° ° °

## 2019-03-24 ENCOUNTER — Ambulatory Visit (INDEPENDENT_AMBULATORY_CARE_PROVIDER_SITE_OTHER): Payer: Self-pay | Admitting: Infectious Diseases

## 2019-03-24 ENCOUNTER — Other Ambulatory Visit: Payer: Self-pay

## 2019-03-24 DIAGNOSIS — Z124 Encounter for screening for malignant neoplasm of cervix: Secondary | ICD-10-CM

## 2019-03-24 DIAGNOSIS — B20 Human immunodeficiency virus [HIV] disease: Secondary | ICD-10-CM

## 2019-03-24 DIAGNOSIS — Z113 Encounter for screening for infections with a predominantly sexual mode of transmission: Secondary | ICD-10-CM

## 2019-03-24 DIAGNOSIS — N912 Amenorrhea, unspecified: Secondary | ICD-10-CM

## 2019-03-24 LAB — POCT URINE PREGNANCY: Preg Test, Ur: NEGATIVE

## 2019-03-24 NOTE — Patient Instructions (Signed)
We will call you with your results from today's blood work.   Would try to decrease how much carbohydrates you eat every day (sugar, tortilla, rice, beans, cakes, cookies, bread). This may be contributing to your lack of periods.   Will get you set up with gynecology to help you get pregnant when you are ready.

## 2019-03-24 NOTE — Progress Notes (Signed)
      Subjective:    Catherine Grimes is a 38 y.o. female here for an annual pelvic exam and pap smear.   Review of Systems: Current GYN complaints or concerns: amenorrhea - since having her child in 2014 she has had very irregular periods; sometimes every 8-12 months. She has a strong family history of diabetes in both parents, sisters and grandparents. She needed medication to help her achieve previous pregnancies. Patient denies any abdominal/pelvic pain, problems with bowel movements, urination, vaginal discharge or intercourse.   Past Medical History:  Diagnosis Date  . HIV infection ([place])   . Infection    HIV    Gynecologic History: G3P1011  No LMP recorded. (Menstrual status: Irregular Periods). Contraception: none Last Pap: 3/207 Results were: abnormal ASCUS, HPV- Anal Intercourse: no Last Mammogram: n/a  Objective:  Physical Exam  Constitutional: Well developed, well nourished, no acute distress. She is alert and oriented x3.  Pelvic: External genitalia is normal in appearance. The vagina is normal in appearance. She has velvety skin discolorations present in gluteal folds. The cervix is bulbous and easily visualized. No CMT, normal expected cervical mucus present. Bimanual exam reveals uterus that is felt to be normal size, shape, and contour. No adnexal masses or tenderness noted. Breasts: symmetrical in contour, shape and texture. No palpable masses/nodules. No nipple discharge.  Psych: She has a normal mood and affect.    Assessment:  Normal pelvic exam Amenorrhea Well-controlled HIV Pre-conception Counseling  Plan:  Cervical Cancer Screening =   Discussed recommended screening interval for women living with HIV disease. Will continue annual screenings with H/O ASCUS with last pap. Referral to GYN as indicated   Results will be communicated to the patient via telephone  She has been counseled and instructed how to perform monthly self breast exams.   Screening mammogram to be scheduled at 38 yo.   Contraception / Family Planning =   She desires more children but not ready to discuss with GYN team.   Urine pregnancy test today   Amenorrhea / Irregular Menses =   Has had previous difficulty achieving pregnancy. I am concerned about underlying metabolic condition contributing. She has findings c/w insulin resistance on exam. Could consider trial of metformin.   Will check Hgb A1C, THS, FSH/LH  Triglycerides elevated > 500. She eats a high fat/high carbohydrate diet. Counseled today to reduce to 100g per day if she can to help with this.   HIV =   Well controlled   She will continue her [place] and F/U as scheduled with Dr. Johnnye Sima for ongoing HIV care.   Janene Madeira, MSN, NP-C [facility] [place] [facility] Office: [phone] Pager: [phone]  03/24/19 4:27 PM

## 2019-03-25 LAB — THYROID PANEL WITH TSH
Free Thyroxine Index: 2.3 (ref 1.4–3.8)
T3 Uptake: 29 % (ref 22–35)
T4, Total: 8 ug/dL (ref 5.1–11.9)
TSH: 2.38 mIU/L

## 2019-03-25 LAB — LUTEINIZING HORMONE: LH: 7 m[IU]/mL

## 2019-03-25 LAB — FOLLICLE STIMULATING HORMONE: FSH: 3.8 m[IU]/mL

## 2019-03-25 LAB — HEMOGLOBIN A1C
Hgb A1c MFr Bld: 6.2 % of total Hgb — ABNORMAL HIGH (ref <5.7)
Mean Plasma Glucose: 131 (calc)
eAG (mmol/L): 7.3 (calc)

## 2019-03-26 LAB — CYTOLOGY - PAP
Diagnosis: NEGATIVE
HPV: NOT DETECTED

## 2019-03-26 LAB — URINE CYTOLOGY ANCILLARY ONLY
Chlamydia: NEGATIVE
Neisseria Gonorrhea: NEGATIVE

## 2019-04-23 NOTE — Progress Notes (Signed)
Please give Ms. Catherine Grimes a call to let her know that her blood tests show me that she is very close to having diabetes. This may be contributing to her irregular/absent periods. Please offer her an appointment to come back to discuss further with me so I can give her some information to prevent and reverse this.   Her pap smear is normal. I would repeat this test in 1 year.

## 2019-04-30 ENCOUNTER — Telehealth: Payer: Self-pay | Admitting: *Deleted

## 2019-04-30 NOTE — Telephone Encounter (Signed)
-----   Message from Moulton Callas, NP sent at 04/23/2019 12:49 PM EDT ----- Please give Ms. Catherine Grimes a call to let her know that her blood tests show me that she is very close to having diabetes. This may be contributing to her irregular/absent periods. Please offer her an appointment to come back to discuss further with me so I can give her some information to prevent and reverse this.   Her pap smear is normal. I would repeat this test in 1 year.

## 2019-04-30 NOTE — Telephone Encounter (Signed)
Per Colletta [place] called patient several times, with an interpreter, to try and schedule her a follow up visit but not able to reach her. Will try again.

## 2019-05-05 ENCOUNTER — Telehealth: Payer: Self-pay

## 2019-05-05 NOTE — Telephone Encounter (Signed)
Error

## 2019-05-11 ENCOUNTER — Telehealth: Payer: Self-pay | Admitting: *Deleted

## 2019-05-11 NOTE — Telephone Encounter (Signed)
-----   Message from [place] sent at 05/11/2019 11:54 AM EDT ----- Regarding: Medication Patient called because she hasn't received her medication this month. Thinks pharmacy had incorrect number, I have updated phone number in patient's chart

## 2019-05-11 NOTE — Telephone Encounter (Signed)
Relayed patient's new phone number [phone]) to [facility] [phone]). They will reach out to her today to schedule delivery. Landis Gandy, RN

## 2019-05-21 ENCOUNTER — Ambulatory Visit: Payer: Self-pay | Admitting: Infectious Diseases

## 2019-06-15 ENCOUNTER — Ambulatory Visit: Payer: Self-pay | Admitting: Infectious Diseases

## 2019-07-05 ENCOUNTER — Other Ambulatory Visit: Payer: Self-pay

## 2019-07-05 ENCOUNTER — Ambulatory Visit (INDEPENDENT_AMBULATORY_CARE_PROVIDER_SITE_OTHER): Payer: Self-pay | Admitting: Infectious Diseases

## 2019-07-05 VITALS — BP 116/77 | HR 84 | Temp 98.3°F | Ht 61.0 in | Wt 168.0 lb

## 2019-07-05 DIAGNOSIS — B20 Human immunodeficiency virus [HIV] disease: Secondary | ICD-10-CM

## 2019-07-05 DIAGNOSIS — Z23 Encounter for immunization: Secondary | ICD-10-CM

## 2019-07-05 DIAGNOSIS — R7303 Prediabetes: Secondary | ICD-10-CM

## 2019-07-05 DIAGNOSIS — Z21 Asymptomatic human immunodeficiency virus [HIV] infection status: Secondary | ICD-10-CM

## 2019-07-05 MED ORDER — METFORMIN HCL 500 MG PO TABS: 500.0000 mg | ORAL_TABLET | Freq: Two times a day (BID) | ORAL | 2 refills | Status: DC

## 2019-07-05 NOTE — Progress Notes (Signed)
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 Regarding her menstrual cycles,  Her grandmother has been visiting and was massaging her uterus and mentioned that her "ovaries and uterus has dropped" which has contributed to her decreased cycles. October and November she did have a period. She was given instructions to try Matcha vitamin.   She usually eats no breakfast in the AM only a glass of milk and maybe a cookie. In the afternoons she eats vegetable soup, meat with vegetables, rice and beans. Supper she eats something quick or left over. She does not eat out hardly ever. She does eat throughout the day tortillas, sweet breads in the morning with hot chocolate multiple times a day.   Has not had flu shot yet.   Review of Systems  All other systems reviewed and are negative.    Past Medical History:  Diagnosis Date   HIV infection ([place])    Infection    HIV    Outpatient Medications Prior to Visit  Medication Sig Dispense Refill   emtricitabine-rilpivir-tenofovir AF (ODEFSEY) 200-25-25 MG TABS tablet Take 1 tablet by mouth daily with breakfast. 30 tablet 2   medroxyPROGESTERone (PROVERA) 10 MG tablet Take 1 tablet (10 mg total) by mouth daily. Use for ten days, every 6-8 weeks if no menses (Patient not taking: Reported on 03/18/2019) 10 tablet 2   Prenatal Vit-Fe Fumarate-FA (GNP PRENATAL VITAMINS) 28-0.8 MG TABS Take 1 tablet by mouth daily. (Patient  not taking: Reported on 03/18/2019) 90 tablet 3   No facility-administered medications prior to visit.     No Known Allergies  Social History   Tobacco Use   Smoking status: Never Smoker   Smokeless tobacco: Never Used  Substance Use Topics   Alcohol use: No    Alcohol/week: 0.0 standard drinks   Drug use: No    Family History  Problem Relation Age of Onset   Diabetes Mother    Diabetes Father     Social History   Substance and Sexual Activity  Sexual Activity Yes   Partners: Male   Birth control/protection: Condom   Comment: accepted condoms     Objective:   Vitals:   07/05/19 1458  BP: 116/77  Pulse: 84  Temp: 98.3 F (36.8 C)  TempSrc: Oral  SpO2: 99%  Weight: 168 lb (76.2 kg)  Height: 5\' 1"  (1.549 m)   Body mass index is 31.74 kg/m.    Lab Results Lab Results  Component Value Date   WBC 8.2 02/08/2019   HGB 13.2 02/08/2019   HCT 38.4 02/08/2019   MCV 85.9 02/08/2019   PLT 300 02/08/2019    Lab Results  Component Value Date   CREATININE 0.78 02/08/2019   BUN 12 02/08/2019   NA 140 02/08/2019   K 4.1 02/08/2019   CL 103 02/08/2019   CO2 29 02/08/2019    Lab Results  Component Value Date   ALT 53 (H) 02/08/2019  AST 32 (H) 02/08/2019   ALKPHOS 70 10/23/2016   BILITOT 0.4 02/08/2019    Lab Results  Component Value Date   CHOL 178 02/08/2019   HDL 30 (L) 02/08/2019   [place]  02/08/2019     Comment:     . LDL cholesterol not calculated. Triglyceride levels greater than 400 mg/dL invalidate calculated LDL results. . Reference range: <100 . Desirable range <100 mg/dL for primary prevention;   <70 mg/dL for patients with CHD or diabetic patients  with > or = 2 CHD risk factors. Marland Kitchen LDL-C is now calculated using the Martin-Hopkins  calculation, which is a validated novel method providing  better accuracy than the Friedewald equation in the  estimation of LDL-C.  Cresenciano Genre et al. Annamaria Helling. [id number]): 2061-2068    (http://education.[employer].com/faq/FAQ164)    TRIG 583 (H) 02/08/2019   CHOLHDL 5.9 (H) 02/08/2019   HIV 1 RNA Quant (copies/mL)  Date Value  02/08/2019 <20 NOT DETECTED  04/29/2018 <20 NOT DETECTED  07/16/2017 <20 DETECTED (A)   CD4 T Cell Abs (/uL)  Date Value  02/08/2019 1,101  04/29/2018 1,010  07/16/2017 1,040     Assessment & Plan:   Problem List Items Addressed This Visit      Unprioritized   Pre-diabetes    Lab Results  Component Value Date   HGBA1C 6.2 (H) 03/24/2019   She has signs of insulin resistance and hyperglycemia on exam with acanthosis nigricans of neck and groin. Very strong family association with diabetes as well. We discussed dietary counseling and opportunities to help decrease carbohydrate intake by reducing to eliminate simple sugars/carbs in the form of chocolate, sweet drinks, tortillas/flour containing products.  We also discussed starting metformin and referral to internal medicine team and services for nutrition/diabetes counseling. Metformin may help her re-establish her menstrual cycles as well given the link between insulin resistance and amenorrhea/PCOS.       Relevant Orders   Referral to Nutrition and Diabetes Services   Human immunodeficiency virus (HIV) disease ([place]) - Primary    Well controlled on current regimen. Will continue follow up with Dr. Johnnye Sima as planned.  Discussed that her desire for pregnancy should not be limited to her condition as it is under excellent control.        Other Visit Diagnoses    Asymptomatic HIV infection ([place])       Need for immunization against influenza       Relevant Orders   Flu Vaccine QUAD 36+ mos IM (Completed)     20 minutes spend in direct counsel of the above.   Janene Madeira, MSN, NP-C [facility] for Infectious [place] Pager: [phone] Office: [phone]  09/01/19  3:38 PM

## 2019-07-05 NOTE — Patient Instructions (Signed)
For you new medication metformin -   Start taking one pill once a day with your lunch for a week. Then increase to one pill twice a day (lunch and dinner).   Please come back in 3 months to check in   Will get you established with nutrition team to discuss further diet changes to help prevent diabetes.  Try to reduce your sweets (sugar, flour products) to twice a week if you can as a treat. Not every day. Meat and vegetables are excellent to eat all the time.

## 2019-07-07 ENCOUNTER — Encounter: Payer: Self-pay | Admitting: Family Medicine

## 2019-07-07 ENCOUNTER — Encounter: Payer: Self-pay | Admitting: Obstetrics & Gynecology

## 2019-09-01 DIAGNOSIS — R7303 Prediabetes: Secondary | ICD-10-CM | POA: Insufficient documentation

## 2019-09-01 NOTE — Assessment & Plan Note (Addendum)
Well controlled on current regimen. Will continue follow up with Dr. Johnnye Sima as planned.  Discussed that her desire for pregnancy should not be limited to her condition as it is under excellent control.

## 2019-09-01 NOTE — Assessment & Plan Note (Signed)
Lab Results  Component Value Date   HGBA1C 6.2 (H) 03/24/2019   She has signs of insulin resistance and hyperglycemia on exam with acanthosis nigricans of neck and groin. Very strong family association with diabetes as well. We discussed dietary counseling and opportunities to help decrease carbohydrate intake by reducing to eliminate simple sugars/carbs in the form of chocolate, sweet drinks, tortillas/flour containing products.  We also discussed starting metformin and referral to internal medicine team and services for nutrition/diabetes counseling. Metformin may help her re-establish her menstrual cycles as well given the link between insulin resistance and amenorrhea/PCOS.

## 2019-09-14 ENCOUNTER — Ambulatory Visit: Payer: Self-pay

## 2019-09-14 ENCOUNTER — Other Ambulatory Visit: Payer: Self-pay

## 2019-09-15 ENCOUNTER — Encounter: Payer: Self-pay | Admitting: Infectious Diseases

## 2019-09-29 ENCOUNTER — Other Ambulatory Visit: Payer: Self-pay

## 2019-09-29 DIAGNOSIS — Z79899 Other long term (current) drug therapy: Secondary | ICD-10-CM

## 2019-09-29 DIAGNOSIS — Z113 Encounter for screening for infections with a predominantly sexual mode of transmission: Secondary | ICD-10-CM

## 2019-09-29 DIAGNOSIS — B20 Human immunodeficiency virus [HIV] disease: Secondary | ICD-10-CM

## 2019-09-29 NOTE — Addendum Note (Signed)
Addended by: Dolan Amen D on: 09/29/2019 04:02 PM   Modules accepted: Orders

## 2019-09-30 ENCOUNTER — Telehealth: Payer: Self-pay | Admitting: *Deleted

## 2019-09-30 ENCOUNTER — Ambulatory Visit: Payer: Self-pay

## 2019-09-30 DIAGNOSIS — N912 Amenorrhea, unspecified: Secondary | ICD-10-CM

## 2019-09-30 LAB — URINE CYTOLOGY ANCILLARY ONLY
Chlamydia: POSITIVE — AB
Comment: NEGATIVE
Comment: NORMAL
Neisseria Gonorrhea: NEGATIVE

## 2019-09-30 LAB — T-HELPER CELL (CD4) - (RCID CLINIC ONLY)
CD4 % Helper T Cell: 41 % (ref 33–65)
CD4 T Cell Abs: 1349 /uL (ref 400–1790)

## 2019-09-30 NOTE — Telephone Encounter (Signed)
Patient had faintly positive urine pregnancy test at home. Was able to add on HCG quant to labs drawn yesterday.

## 2019-09-30 NOTE — Telephone Encounter (Signed)
Patient called, asked if she could come in and do a pregnancy test. She was given a lab appointment, but did not ask at the appointment. RN called her, offered her a nurse visit for urine pregnancy test today. She accepted. If positive, will follow with confirmatory lab. Landis Gandy, RN

## 2019-09-30 NOTE — Telephone Encounter (Signed)
Thanks

## 2019-10-01 ENCOUNTER — Telehealth: Payer: Self-pay

## 2019-10-01 DIAGNOSIS — B20 Human immunodeficiency virus [HIV] disease: Secondary | ICD-10-CM

## 2019-10-01 LAB — CBC
HCT: 40.6 % (ref 35.0–45.0)
Hemoglobin: 14 g/dL (ref 11.7–15.5)
MCH: 29.8 pg (ref 27.0–33.0)
MCHC: 34.5 g/dL (ref 32.0–36.0)
MCV: 86.4 fL (ref 80.0–100.0)
MPV: 10.4 fL (ref 7.5–12.5)
Platelets: 313 10*3/uL (ref 140–400)
RBC: 4.7 10*6/uL (ref 3.80–5.10)
RDW: 12.9 % (ref 11.0–15.0)
WBC: 8.7 10*3/uL (ref 3.8–10.8)

## 2019-10-01 LAB — LIPID PANEL
Cholesterol: 153 mg/dL (ref <200)
HDL: 39 mg/dL — ABNORMAL LOW (ref >=50)
LDL Cholesterol (Calc): 81 mg/dL (calc)
Non-HDL Cholesterol (Calc): 114 mg/dL (calc) (ref <130)
Total CHOL/HDL Ratio: 3.9 (calc) (ref <5.0)
Triglycerides: 251 mg/dL — ABNORMAL HIGH (ref <150)

## 2019-10-01 LAB — COMPREHENSIVE METABOLIC PANEL
AG Ratio: 1.5 (calc) (ref 1.0–2.5)
ALT: 50 U/L — ABNORMAL HIGH (ref 6–29)
AST: 29 U/L (ref 10–30)
Albumin: 4.4 g/dL (ref 3.6–5.1)
Alkaline phosphatase (APISO): 63 U/L (ref 31–125)
BUN: 12 mg/dL (ref 7–25)
CO2: 29 mmol/L (ref 20–32)
Calcium: 9.7 mg/dL (ref 8.6–10.2)
Chloride: 102 mmol/L (ref 98–110)
Creat: 0.67 mg/dL (ref 0.50–1.10)
Globulin: 3 g/dL (calc) (ref 1.9–3.7)
Glucose, Bld: 85 mg/dL (ref 65–99)
Potassium: 3.9 mmol/L (ref 3.5–5.3)
Sodium: 137 mmol/L (ref 135–146)
Total Bilirubin: 0.4 mg/dL (ref 0.2–1.2)
Total Protein: 7.4 g/dL (ref 6.1–8.1)

## 2019-10-01 LAB — RPR: RPR Ser Ql: NONREACTIVE

## 2019-10-01 LAB — HCG, QUANTITATIVE, PREGNANCY: HCG, Total, QN: <3 m[IU]/mL

## 2019-10-01 LAB — HIV-1 RNA QUANT-NO REFLEX-BLD
HIV 1 RNA Quant: <20 copies/mL
HIV-1 RNA Quant, Log: <1.3 Log copies/mL

## 2019-10-01 LAB — TEST AUTHORIZATION

## 2019-10-01 NOTE — Telephone Encounter (Signed)
Patient tested pos for Chlamydia. Will forward to MD to advise on next steps. Urine cytology ancillary only Order: [id number] Status:  Edited Result - FINAL Visible to patient:  No (inaccessible in MyChart) Next appt:  12/15/2019 at 03:00 PM in Infectious Diseases ([facility]) Dx:  Screening examination for venereal di... Component 2 d ago  [place] Gonorrhea Negative   Chlamydia PositiveAbnormal    Comment Normal Reference [place] Chlamydia - Negative   Comment Normal Reference Range [place] Gonorrhea - Negative        Will contact patient once orders are given Aundria Rud, [place]

## 2019-10-04 ENCOUNTER — Other Ambulatory Visit: Payer: Self-pay

## 2019-10-04 DIAGNOSIS — B20 Human immunodeficiency virus [HIV] disease: Secondary | ICD-10-CM

## 2019-10-04 MED ORDER — ODEFSEY 200-25-25 MG PO TABS: 1.0000 | ORAL_TABLET | Freq: Every day | ORAL | 2 refills | Status: DC

## 2019-10-04 NOTE — Telephone Encounter (Signed)
Relayed results to patient. 

## 2019-10-04 NOTE — Telephone Encounter (Signed)
Thanks  Negative

## 2019-10-04 NOTE — Telephone Encounter (Signed)
Called patient with assistance from [place] interpreter in regards for treatment for STD. Patient will come into office on 3/10 at 2pm. Denies any allergies to medicine. Patient would like to know about her other labs done same. Day informed patient that pregnancy test was negative.  Patient is also requesting refills on Odefsey. Will send refills to [facility] on [facility], [place]

## 2019-10-04 NOTE — Telephone Encounter (Signed)
Needs azithro 2g po x 1 thanks

## 2019-10-04 NOTE — Addendum Note (Signed)
Addended by: Aundria Rud on: 10/04/2019 02:47 PM   Modules accepted: Orders

## 2019-10-04 NOTE — Progress Notes (Signed)
Pt needs azithro 1g po once

## 2019-10-06 ENCOUNTER — Other Ambulatory Visit: Payer: Self-pay

## 2019-10-06 ENCOUNTER — Ambulatory Visit (INDEPENDENT_AMBULATORY_CARE_PROVIDER_SITE_OTHER): Payer: Self-pay

## 2019-10-06 DIAGNOSIS — A749 Chlamydial infection, unspecified: Secondary | ICD-10-CM

## 2019-10-06 MED ORDER — AZITHROMYCIN 250 MG PO TABS
1000.0000 mg | ORAL_TABLET | Freq: Once | ORAL | Status: AC
  Administered 2019-10-06: 1000 mg via ORAL

## 2019-11-25 ENCOUNTER — Other Ambulatory Visit: Payer: Self-pay

## 2019-11-25 ENCOUNTER — Ambulatory Visit: Payer: Self-pay | Admitting: Internal Medicine

## 2019-11-25 ENCOUNTER — Encounter: Payer: Self-pay | Admitting: Dietician

## 2019-11-25 ENCOUNTER — Ambulatory Visit: Payer: Self-pay | Admitting: Dietician

## 2019-11-25 ENCOUNTER — Encounter: Payer: Self-pay | Admitting: Internal Medicine

## 2019-11-25 VITALS — BP 113/57 | HR 67 | Temp 98.4°F | Ht 61.75 in | Wt 166.1 lb

## 2019-11-25 DIAGNOSIS — N907 Vulvar cyst: Secondary | ICD-10-CM

## 2019-11-25 DIAGNOSIS — N912 Amenorrhea, unspecified: Secondary | ICD-10-CM

## 2019-11-25 DIAGNOSIS — N926 Irregular menstruation, unspecified: Secondary | ICD-10-CM

## 2019-11-25 DIAGNOSIS — R7303 Prediabetes: Secondary | ICD-10-CM

## 2019-11-25 NOTE — Patient Instructions (Addendum)
Ms. Catherine Grimes, It was great meeting you! We are glad to have you establishing with [place] in our clinic.   Today we discussed your A1C which is in the pre-diabetes range. I would continue taking Metformin as prescribed. We will wait to check it again until you can get the orange card.   Your menstrual irregularities - I'd like to get you into a GYN as soon as possible, but we will need to go through the orange card process first.   Your chlamydia infection - I would ensure this has been adequately treated at the infectious disease clinic where you will not have to pay a lab fee for the testing.   Your bump: it does not look infected. It may be able to be drained when you see the GYN, but there is nothing we need to do for it at this time.   I have included the information needed to start the orange card process. This will cover future referrals to specialists and any medications needed.   Take care, Catherine Grimes, Fue genial conocerte! Nos complace que se establezca con nosotros en nuestra clnica.  Hoy hablamos sobre su A1C, que se encuentra en el rango de prediabetes. Seguira tomando metformina segn lo prescrito. Esperaremos para volver a comprobarlo hasta que puedas hacerte con la tarjeta naranja.  Sus irregularidades menstruales: me gustara llevarla a un gineclogo lo antes posible, pero primero tendremos que pasar por el proceso de la tarjeta naranja.  Su infeccin por clamidia: me asegurara de que se haya tratado adecuadamente en la clnica de enfermedades infecciosas, donde no tendr que pagar una tarifa de laboratorio por la prueba.   Tu bulto: no parece infectado. Es posible que se pueda drenar cuando vea al gineclogo, pero no hay nada que debamos hacer al [employer].  He incluido la informacin necesaria para iniciar el proceso de la tarjeta naranja. Esto cubrir futuras referencias a especialistas y cualquier medicamento  necesario.  Cudate, Catherine Grimes

## 2019-11-25 NOTE — Progress Notes (Signed)
New Patient Office Visit  Subjective:  Patient ID: Catherine Grimes, female    DOB: 1980/10/19  Age: 39 y.o. MRN: [id number]  CC:  Chief Complaint  Patient presents with  . Establish Care    Pre-diabetes    HPI Catherine Grimes presents to establish care for pre-diabetes, as well as complaints of amenorrhea and vulvar bump. Please see problem based charting for further details.   Past Medical History:  Diagnosis Date  . HIV infection ([place])   . Infection    HIV    Past Surgical History:  Procedure Laterality Date  . NO PAST SURGERIES      Family History  Problem Relation Age of Onset  . Diabetes Mother   . Diabetes Father     Social History   Socioeconomic History  . Marital status: Married    Spouse name: Not on file  . Number of children: Not on file  . Years of education: Not on file  . Highest education level: Not on file  Occupational History  . Not on file  Tobacco Use  . Smoking status: Never Smoker  . Smokeless tobacco: Never Used  Substance and Sexual Activity  . Alcohol use: No    Alcohol/week: 0.0 standard drinks  . Drug use: No  . Sexual activity: Yes    Partners: Male    Birth control/protection: Condom    Comment: accepted condoms  Other Topics Concern  . Not on file  Social History Narrative  . Not on file   Social Determinants of Health   Financial Resource Strain:   . Difficulty of Paying Living Expenses:   Food Insecurity:   . Worried About Charity fundraiser in the Last Year:   . Arboriculturist in the Last Year:   Transportation Needs:   . Film/video editor (Medical):   Marland Kitchen Lack of Transportation (Non-Medical):   Physical Activity:   . Days of Exercise per Week:   . Minutes of Exercise per Session:   Stress:   . Feeling of Stress :   Social Connections:   . Frequency of Communication with Friends and Family:   . Frequency of Social Gatherings with Friends and Family:   . Attends Religious Services:     . Active Member of Clubs or Organizations:   . Attends Archivist Meetings:   Marland Kitchen Marital Status:   Intimate Partner Violence:   . Fear of Current or Ex-Partner:   . Emotionally Abused:   Marland Kitchen Physically Abused:   . Sexually Abused:     ROS Review of Systems  Constitutional: Negative for chills and fever.  Eyes: Negative for visual disturbance.  Respiratory: Negative for shortness of breath.   Cardiovascular: Negative for chest pain.  Gastrointestinal: Negative for abdominal pain and nausea.  Endocrine: Negative for polydipsia and polyuria.  Genitourinary: Positive for genital sores and menstrual problem. Negative for dysuria, hematuria, pelvic pain and vaginal pain.  Neurological: Negative for dizziness and headaches.  Psychiatric/Behavioral: Negative for dysphoric mood and sleep disturbance.    Objective:   Today's Vitals: BP (!) 113/57 (BP Location: Left Arm, Patient Position: Sitting, Cuff Size: Normal)   Pulse 67   Temp 98.4 F (36.9 C) (Oral)   Ht 5' 1.75" (1.568 m)   Wt 166 lb 1.6 oz (75.3 kg)   SpO2 99%   BMI 30.63 kg/m   Physical Exam Exam conducted with a chaperone present.  Constitutional:      General: She is  not in acute distress.    Appearance: Normal appearance.  Eyes:     Conjunctiva/sclera: Conjunctivae normal.  Pulmonary:     Effort: Pulmonary effort is normal.  Abdominal:     General: There is no distension.     Palpations: Abdomen is soft.     Tenderness: There is no abdominal tenderness.  Genitourinary:    Comments: Small area of swelling inside labia minora that is non-tender. No active drainage or signs of infection.  Musculoskeletal:     Cervical back: Neck supple.  Neurological:     Mental Status: She is alert.     Assessment & Plan:   Problem List Items Addressed This Visit    None    Visit Diagnoses    Amenorrhea    -  Primary   Relevant Orders   POCT Urine Pregnancy      Outpatient Encounter Medications as of  11/25/2019  Medication Sig  . emtricitabine-rilpivir-tenofovir AF (ODEFSEY) 200-25-25 MG TABS tablet Take 1 tablet by mouth daily with breakfast.  . medroxyPROGESTERone (PROVERA) 10 MG tablet Take 1 tablet (10 mg total) by mouth daily. Use for ten days, every 6-8 weeks if no menses (Patient not taking: Reported on 03/18/2019)  . metFORMIN (GLUCOPHAGE) 500 MG tablet Take 1 tablet (500 mg total) by mouth 2 (two) times daily with a meal.  . Prenatal Vit-Fe Fumarate-FA (GNP PRENATAL VITAMINS) 28-0.8 MG TABS Take 1 tablet by mouth daily. (Patient not taking: Reported on 03/18/2019)   No facility-administered encounter medications on file as of 11/25/2019.    Follow-up: Return in about 3 months (around 02/24/2020) for establish with PCP.   Delice Bison, DO

## 2019-11-25 NOTE — Progress Notes (Signed)
Patient left without being seen today. Will mail her information about insulin resistance/preduabetes and plan to follow up in 3 months.

## 2019-11-30 ENCOUNTER — Encounter: Payer: Self-pay | Admitting: Internal Medicine

## 2019-11-30 DIAGNOSIS — N907 Vulvar cyst: Secondary | ICD-10-CM | POA: Insufficient documentation

## 2019-11-30 NOTE — Assessment & Plan Note (Signed)
Patient reports longstanding history of irregular menses dating back to when she was a teenager. Does not recall any work-up being done, but thinks she may have been on OCPs at some point. She is currently sexually active with her husband. Uses condoms for protection. Recently had intercourse unprotected approximately 2 weeks ago. She has not had a menstrual cycle since December. She will intermittently have painful cramping similar to when she menstruates, but has not had any uterine bleeding since December.  Unfortunately, patient left before we could obtain a urine pregnancy test. I called her listed number via spanish interpreter and left a message for her to either return to clinic for a test, purchase a pregnancy test at a grocery store or pharmacy, or go to [facility] where she has been receiving her care up until now.  Once she is able to get the orange card, will plan to refer her to GYN for further evaluation of her menstrual irregularities.

## 2019-11-30 NOTE — Progress Notes (Signed)
Internal Medicine Clinic Attending  Case discussed with Dr. Bloomfield at the time of the visit.  We reviewed the resident's history and exam and pertinent patient test results.  I agree with the assessment, diagnosis, and plan of care documented in the resident's note.  

## 2019-11-30 NOTE — Assessment & Plan Note (Signed)
Patient has what appears to be a labial cyst on exam. The area is non-tender, does not appear infected, and is not actively draining. Will hopefully be able to address this with GYN referral once orange card is obtained.

## 2019-11-30 NOTE — Assessment & Plan Note (Signed)
Patient last had A1C checked in 02/2019 which was 6.2. She was started on Metformin 500 mg BID. Counseled patient that she does not have diabetes and what the diagnosis of pre-diabetes meant.  Advised her to continue this, and we can check an A1C once she completes process for orange card.

## 2019-12-02 ENCOUNTER — Other Ambulatory Visit: Payer: Self-pay | Admitting: Infectious Diseases

## 2019-12-15 ENCOUNTER — Other Ambulatory Visit: Payer: Self-pay

## 2019-12-16 ENCOUNTER — Encounter: Payer: Self-pay | Admitting: Infectious Diseases

## 2019-12-16 ENCOUNTER — Other Ambulatory Visit: Payer: Self-pay | Admitting: Infectious Diseases

## 2019-12-16 ENCOUNTER — Other Ambulatory Visit: Payer: Self-pay

## 2019-12-16 ENCOUNTER — Ambulatory Visit (INDEPENDENT_AMBULATORY_CARE_PROVIDER_SITE_OTHER): Payer: Self-pay | Admitting: Infectious Diseases

## 2019-12-16 VITALS — BP 105/77 | HR 93 | Temp 98.2°F | Wt 165.4 lb

## 2019-12-16 DIAGNOSIS — Z113 Encounter for screening for infections with a predominantly sexual mode of transmission: Secondary | ICD-10-CM

## 2019-12-16 DIAGNOSIS — B20 Human immunodeficiency virus [HIV] disease: Secondary | ICD-10-CM

## 2019-12-16 DIAGNOSIS — R7303 Prediabetes: Secondary | ICD-10-CM

## 2019-12-16 DIAGNOSIS — N926 Irregular menstruation, unspecified: Secondary | ICD-10-CM

## 2019-12-16 DIAGNOSIS — Z79899 Other long term (current) drug therapy: Secondary | ICD-10-CM

## 2019-12-16 LAB — POCT URINE PREGNANCY: Preg Test, Ur: NEGATIVE

## 2019-12-16 MED ORDER — ODEFSEY 200-25-25 MG PO TABS: 1.0000 | ORAL_TABLET | Freq: Every day | ORAL | 3 refills | Status: DC

## 2019-12-16 NOTE — Addendum Note (Signed)
Addended by: Dolan Amen D on: 12/16/2019 05:17 PM   Modules accepted: Orders

## 2019-12-16 NOTE — Assessment & Plan Note (Signed)
She is doing well Offered her prep for her partner.  She wants to bring them here.  Offered/refused condoms.  rtc in 9 months.

## 2019-12-16 NOTE — Progress Notes (Signed)
   Subjective:    Patient ID: Catherine Grimes, female    DOB: 08/01/1980, 39 y.o.   MRN: [id number]  HPI 39yo F born in [place], came to [place] 2001, who is HIV+ since 2012, hepatic steatosis. She has been on odefsy, her first and only rx. Has had no difficulty taking, missed only 1 day. Had PAP with NP Dixon 02-2019 (-). No problems with medications. 85 yo daughter (who wants a brother). Getting married this year.   Her fiance is aware of her dx.   HIV 1 RNA Quant (copies/mL)  Date Value  09/29/2019 <20 NOT DETECTED  02/08/2019 <20 NOT DETECTED  04/29/2018 <20 NOT DETECTED   CD4 T Cell Abs (/uL)  Date Value  09/29/2019 1,349  02/08/2019 1,101  04/29/2018 1,010    Review of Systems  Constitutional: Negative for appetite change and unexpected weight change.  Respiratory: Negative for cough and shortness of breath.   Gastrointestinal: Negative for constipation and diarrhea.  Genitourinary: Positive for menstrual problem (does think she is pregnant. ). Negative for difficulty urinating.  Psychiatric/Behavioral: Negative for sleep disturbance.  has been exercising more, lost 10#.     Objective:   Physical Exam Vitals reviewed.  Constitutional:      Appearance: Normal appearance.  HENT:     Mouth/Throat:     Mouth: Mucous membranes are moist.     Pharynx: No oropharyngeal exudate.  Eyes:     Extraocular Movements: Extraocular movements intact.     Pupils: Pupils are equal, round, and reactive to light.  Cardiovascular:     Rate and Rhythm: Normal rate and regular rhythm.  Pulmonary:     Effort: Pulmonary effort is normal.     Breath sounds: Normal breath sounds.  Abdominal:     General: Bowel sounds are normal.     Palpations: Abdomen is soft.     Tenderness: There is no right CVA tenderness or left CVA tenderness.  Musculoskeletal:     Cervical back: Normal range of motion and neck supple.     Right lower leg: No edema.     Left lower leg: No edema.    Neurological:     Mental Status: She is alert.  Psychiatric:        Mood and Affect: Mood normal.           Assessment & Plan:

## 2019-12-16 NOTE — Assessment & Plan Note (Addendum)
She is on metformin but she is not clear that she has DM She does not check her Glc States that when she was seen by PCP they were not sure why she was there.  Will have her seen by [facility] [place] her A1C.

## 2019-12-16 NOTE — Assessment & Plan Note (Signed)
Will check her HCG.  Refer her to GYN.

## 2019-12-17 LAB — HEMOGLOBIN A1C
Hgb A1c MFr Bld: 5.5 % of total Hgb (ref <5.7)
Mean Plasma Glucose: 111 (calc)
eAG (mmol/L): 6.2 (calc)

## 2019-12-20 LAB — MOLECULAR ANCILLARY ONLY
Chlamydia: POSITIVE — AB
Comment: NEGATIVE
Comment: NORMAL
Neisseria Gonorrhea: NEGATIVE

## 2019-12-22 ENCOUNTER — Ambulatory Visit: Payer: Self-pay

## 2019-12-30 ENCOUNTER — Telehealth: Payer: Self-pay

## 2019-12-30 NOTE — Telephone Encounter (Signed)
Multiple attempts have been made to reach out to patient regarding STD test results. Will refer patient to [facility] to follow up on treatment.  [place]

## 2019-12-30 NOTE — Telephone Encounter (Signed)
-----   Message from Campbell Riches, MD sent at 12/20/2019  3:49 PM EDT ----- Needs Azithromycin 1 g po x 1 thanks ----- Message ----- From: Interface, Lab In [place] Sent: 12/20/2019   1:45 PM EDT To: Campbell Riches, MD

## 2020-01-04 ENCOUNTER — Encounter: Payer: Self-pay | Admitting: Infectious Diseases

## 2020-01-07 ENCOUNTER — Other Ambulatory Visit: Payer: Self-pay | Admitting: Infectious Diseases

## 2020-01-07 DIAGNOSIS — B20 Human immunodeficiency virus [HIV] disease: Secondary | ICD-10-CM

## 2020-01-17 ENCOUNTER — Ambulatory Visit: Payer: Self-pay

## 2020-01-20 ENCOUNTER — Telehealth: Payer: Self-pay | Admitting: Family Medicine

## 2020-01-20 NOTE — Telephone Encounter (Signed)
Received a call from the patient stating she had received a call three weeks ago. Eda spoke with the patient, and I read a telephone call from her provider's CMA. She was told to call the [facility] for a follow-up treatment appointment. Eda explained to the patient to call Health Department. She stated she understood.

## 2020-01-25 ENCOUNTER — Ambulatory Visit: Payer: Self-pay | Admitting: Infectious Diseases

## 2020-02-03 ENCOUNTER — Ambulatory Visit: Payer: Self-pay

## 2020-02-03 ENCOUNTER — Ambulatory Visit (INDEPENDENT_AMBULATORY_CARE_PROVIDER_SITE_OTHER): Payer: Self-pay | Admitting: Infectious Diseases

## 2020-02-03 ENCOUNTER — Other Ambulatory Visit: Payer: Self-pay

## 2020-02-03 ENCOUNTER — Encounter: Payer: Self-pay | Admitting: Infectious Diseases

## 2020-02-03 VITALS — BP 115/73 | HR 71 | Wt 161.0 lb

## 2020-02-03 DIAGNOSIS — B20 Human immunodeficiency virus [HIV] disease: Secondary | ICD-10-CM

## 2020-02-03 DIAGNOSIS — A630 Anogenital (venereal) warts: Secondary | ICD-10-CM | POA: Insufficient documentation

## 2020-02-03 DIAGNOSIS — N912 Amenorrhea, unspecified: Secondary | ICD-10-CM

## 2020-02-03 DIAGNOSIS — A749 Chlamydial infection, unspecified: Secondary | ICD-10-CM

## 2020-02-03 NOTE — Assessment & Plan Note (Signed)
Will recheck her test today Will also check her UA.

## 2020-02-03 NOTE — Progress Notes (Signed)
   Subjective:    Patient ID: Catherine Grimes, female    DOB: 1980/10/11, 39 y.o.   MRN: [id number]  HPI 39yo F born in [place], came to [place] 2001, who is HIV+ since 2012, hepatic steatosis. She has been on odefsy, her first and only rx.   She was seen in ID 12-2019. Had A1C 5.5%.  She was also noted to have Chlamydia. She was given azithro.   3-4 months ago noted a wart on her labia. She has noted that this has gotten larger. Has irritation, pain with wiping.  No discharge.   Boyfriend lives in another state-not intimate- last partner was 1 year ago.  Has not had GYN appt yet. Did not understand call from their office.    Review of Systems  Constitutional: Negative for chills and fever.  Gastrointestinal: Negative for diarrhea and nausea.  Genitourinary: Positive for difficulty urinating, dysuria, genital sores and hematuria (last menses 06-2019).  Please see HPI. All other systems reviewed and negative.      Objective:   Physical Exam Constitutional:      Appearance: Normal appearance. She is obese.  Cardiovascular:     Rate and Rhythm: Normal rate and regular rhythm.  Pulmonary:     Effort: Pulmonary effort is normal.     Breath sounds: Normal breath sounds.  Abdominal:     General: Bowel sounds are normal. There is no distension.     Palpations: Abdomen is soft.     Tenderness: There is no abdominal tenderness.  Musculoskeletal:     Right lower leg: No edema.     Left lower leg: No edema.  Skin:    General: Skin is warm.  Neurological:     Mental Status: She is alert.  Psychiatric:        Mood and Affect: Mood normal.           Assessment & Plan:

## 2020-02-03 NOTE — Assessment & Plan Note (Signed)
I offered to exam her but explained that she needs GYN f/u  We called the GYN clinic ohwever they closed at 4:30 today. I will ask our staff to have interpreter call pt in am.  I will also forward to my excellent colleauges at [facility] to see if they have an interpreter who can call her as well.

## 2020-02-04 LAB — URINALYSIS, ROUTINE W REFLEX MICROSCOPIC
Bacteria, UA: NONE SEEN /HPF
Bilirubin Urine: NEGATIVE
Glucose, UA: NEGATIVE
Hgb urine dipstick: NEGATIVE
Hyaline Cast: NONE SEEN /LPF
Ketones, ur: NEGATIVE
Nitrite: NEGATIVE
Protein, ur: NEGATIVE
RBC / HPF: NONE SEEN /HPF (ref 0–2)
Specific Gravity, Urine: 1.01 (ref 1.001–1.03)
pH: 6.5 (ref 5.0–8.0)

## 2020-03-16 ENCOUNTER — Other Ambulatory Visit: Payer: Self-pay | Admitting: Infectious Diseases

## 2020-03-30 ENCOUNTER — Encounter: Payer: Self-pay | Admitting: Infectious Diseases

## 2020-04-12 ENCOUNTER — Other Ambulatory Visit: Payer: Self-pay

## 2020-04-12 ENCOUNTER — Ambulatory Visit (INDEPENDENT_AMBULATORY_CARE_PROVIDER_SITE_OTHER): Payer: Self-pay | Admitting: Obstetrics and Gynecology

## 2020-04-12 ENCOUNTER — Other Ambulatory Visit (HOSPITAL_COMMUNITY)
Admission: RE | Admit: 2020-04-12 | Discharge: 2020-04-12 | Disposition: A | Discharge status: Home / Self Care | Payer: Self-pay | Source: Ambulatory Visit | Attending: Obstetrics and Gynecology | Admitting: Obstetrics and Gynecology

## 2020-04-12 ENCOUNTER — Encounter: Payer: Self-pay | Admitting: Obstetrics and Gynecology

## 2020-04-12 VITALS — BP 119/73 | HR 60 | Ht 62.0 in | Wt 172.7 lb

## 2020-04-12 DIAGNOSIS — Z113 Encounter for screening for infections with a predominantly sexual mode of transmission: Secondary | ICD-10-CM | POA: Insufficient documentation

## 2020-04-12 DIAGNOSIS — Z23 Encounter for immunization: Secondary | ICD-10-CM

## 2020-04-12 DIAGNOSIS — B20 Human immunodeficiency virus [HIV] disease: Secondary | ICD-10-CM

## 2020-04-12 DIAGNOSIS — N926 Irregular menstruation, unspecified: Secondary | ICD-10-CM

## 2020-04-12 DIAGNOSIS — Z01411 Encounter for gynecological examination (general) (routine) with abnormal findings: Secondary | ICD-10-CM

## 2020-04-12 DIAGNOSIS — N907 Vulvar cyst: Secondary | ICD-10-CM

## 2020-04-12 MED ORDER — MEDROXYPROGESTERONE ACETATE 10 MG PO TABS: ORAL_TABLET | ORAL | 1 refills | Status: DC

## 2020-04-12 MED FILL — MEDROXYPROGESTERONE 10 MG T: 10 | 90 days supply | Qty: 42 | Fill #0

## 2020-04-12 NOTE — Progress Notes (Signed)
Obstetrics and Gynecology New Patient Evaluation  Appointment Date: 04/12/2020  OBGYN Clinic: [facility]  Primary Care Provider: Modena Nunnery D  Referring Provider: Campbell Riches, MD  Chief Complaint: irregular periods and vulvar cyst  History of Present Illness: Catherine Grimes is a 39 y.o. Hispanic G2P1011 (LMP: 05/2019), seen for the above chief complaint. Her past medical history is significant for BMI 30s, HIV (well controlled), fatty liver,  DM2 vs pre-diabetes, h/o chlamydia  Irregular periods: see below. Patient states she wants to try and get pregnant in the near future. No facial hair, +hair from mons to umbilicus.   Vulvar cyst: started about 43m ago and has gotten bigger. No prior history. No pain  Review of Systems: Pertinent items noted in HPI and remainder of comprehensive ROS otherwise negative.    Patient Active Problem List   Diagnosis Date Noted  . Chlamydia 02/03/2020  . Anogenital wart 02/03/2020  . Labial cyst 11/30/2019  . Pre-diabetes 09/01/2019  . Breast pain in female 03/18/2019  . Hepatitis 10/04/2015  . Irregular menstrual cycle 02/02/2015  . Pyogenic granuloma of oral mucosa 06/24/2014  . Traumatic arthritis 05/07/2012  . Human immunodeficiency virus (HIV) disease ([place]) 12/12/2009    Past Medical History:  Past Medical History:  Diagnosis Date  . HIV infection ([place])   . Infection    HIV    Past Surgical History:  Past Surgical History:  Procedure Laterality Date  . NO PAST SURGERIES      Past Obstetrical History:  OB History  Gravida Para Term Preterm AB Living  2 1 1   1 1   SAB TAB Ectopic Multiple Live Births          1    # Outcome Date GA Lbr Len/2nd Weight Sex Delivery Anes PTL Lv  2 AB 04/12/14          1 Term 02/23/12 [redacted]w[redacted]d 02:41 / 00:17 5 lb 13.8 oz (2.659 kg) F Vag-Spont None  LIV    Past Gynecological History: As per HPI. Periods: LMP 05/2019 and patient cant  remember last time she had one before that. Periods have always been irregular. Never has used birth control. She was on something (sounds like provera) in the past and that gave her a period.  History of Pap Smear(s): Yes.   Last pap 02/2019, which was negative  Social History:  Social History   Socioeconomic History  . Marital status: In a relationship    Spouse name: Not on file  . Number of children: Not on file  . Years of education: Not on file  . Highest education level: Not on file  Occupational History  . Not on file  Tobacco Use  . Smoking status: Never Smoker  . Smokeless tobacco: Never Used  Vaping Use  . Vaping Use: Never used  Substance and Sexual Activity  . Alcohol use: No    Alcohol/week: 0.0 standard drinks  . Drug use: No  . Sexual activity: Yes    Partners: Male    Birth control/protection: Condom    Comment: accepted condoms  Other Topics Concern  . Not on file  Social History Narrative  . Not on file   Social Determinants of Health   Financial Resource Strain:   . Difficulty of Paying Living Expenses: Not on file  Food Insecurity:   . Worried About Charity fundraiser in the Last Year: Not on file  . Ran Out of Food in the  Last Year: Not on file  Transportation Needs:   . Lack of Transportation (Medical): Not on file  . Lack of Transportation (Non-Medical): Not on file  Physical Activity:   . Days of Exercise per Week: Not on file  . Minutes of Exercise per Session: Not on file  Stress:   . Feeling of Stress : Not on file  Social Connections:   . Frequency of Communication with Friends and Family: Not on file  . Frequency of Social Gatherings with Friends and Family: Not on file  . Attends Religious Services: Not on file  . Active Member of Clubs or Organizations: Not on file  . Attends Archivist Meetings: Not on file  . Marital Status: Not on file  Intimate Partner Violence:   . Fear of Current or Ex-Partner: Not on file  .  Emotionally Abused: Not on file  . Physically Abused: Not on file  . Sexually Abused: Not on file    Family History:  Family History  Problem Relation Age of Onset  . Diabetes Mother   . Diabetes Father      Medications Kooper Chriswell had no medications administered during this visit. Current Outpatient Medications  Medication Sig Dispense Refill  . emtricitabine-rilpivir-tenofovir AF (ODEFSEY) 200-25-25 MG TABS tablet Take 1 tablet by mouth daily with breakfast. 90 tablet 3  . metFORMIN (GLUCOPHAGE) 500 MG tablet TAKE 1 TABLET(500 MG) BY MOUTH TWICE DAILY WITH A MEAL 60 tablet 2   No current facility-administered medications for this visit.    Allergies Patient has no known allergies.   Physical Exam:  BP 119/73   Pulse 60   Ht 5\' 2"  (1.575 m)   Wt 172 lb 11.2 oz (78.3 kg)   BMI 31.59 kg/m  Body mass index is 31.59 kg/m. General appearance: Well nourished, well developed female in no acute distress.  Cardiovascular: normal s1 and s2.  No murmurs, rubs or gallops. Respiratory:  Clear to auscultation bilateral. Normal respiratory effort Abdomen: positive bowel sounds and no masses, hernias; diffusely non tender to palpation, non distended Neuro/Psych:  Normal mood and affect.  Skin:  Warm and dry.  Lymphatic:  No inguinal lymphadenopathy.   Pelvic exam: is not limited by body habitus EGBUS: there is a 3-4cm lump about 2cm from the clitoral hood region at the 10 o'clock position. It is mobile, nttp, feels like it has some fluctuance to it and normal overlying skin.  Vagina: within normal limits and with no blood or discharge in the vault Cervix: normal appearing cervix without tenderness, discharge or lesions. Uterus:  nonenlarged and non tender Adnexa:  normal adnexa and no mass, fullness, tenderness Rectovaginal: deferred  Laboratory: none  Radiology: none  Assessment: pt stable  Plan:  1. Human immunodeficiency virus (HIV) disease ([place]) Will let ID  providers know she desires to get pregnant to see if they want to switch her therapy. Labs from this year show good CD4 count and VL is undetectable. I told her that her HIV shouldn't impact trying to get pregnancy or a pregnancy. I let Dr. Johnnye Sima in case he wanted to switch her off her current regimen.   2. Gynecologic exam  - Flu Vaccine QUAD 36+ mos IM  3. Irregular periods S/s sound c/w PCOS. She had normal TFTs/LH/FSH in 02/2019. Will get rest of labs to confirm. I d/w her risk of medical issues with PCOS such as diabetes, HTN, obesity, etc and issues with fertility with PCOS. I also told her of  need for regular withdrawal bleeds. Recommend cyclic provera to give her a regular period.  STI test of cure today - Cervicovaginal ancillary only( [place]) - Cytology - PAP( [place]) - TSH+Prl+TestT+TestF+17OHP - Beta hCG quant (ref lab)  4. Labial cyst Not concerning but not in a usual spot for a typical GYN lesion. Patient referred to urogyn.   Orders Placed This Encounter  Procedures  . Flu Vaccine QUAD 36+ mos IM  . TSH+Prl+TestT+TestF+17OHP  . Beta hCG quant (ref lab)    RTC 89m for follow up  Durene Romans MD Attending Center for [facility])

## 2020-04-13 LAB — CERVICOVAGINAL ANCILLARY ONLY
Bacterial Vaginitis (gardnerella): POSITIVE — AB
Candida Glabrata: NEGATIVE
Candida Vaginitis: NEGATIVE
Comment: NEGATIVE
Comment: NEGATIVE
Comment: NEGATIVE
Comment: NEGATIVE
Trichomonas: NEGATIVE

## 2020-04-14 LAB — CYTOLOGY - PAP
Comment: NEGATIVE
Diagnosis: NEGATIVE
High risk HPV: NEGATIVE

## 2020-04-16 LAB — BETA HCG QUANT (REF LAB): hCG Quant: <1 m[IU]/mL

## 2020-04-16 LAB — TSH+PRL+TESTT+TESTF+17OHP
17-Hydroxyprogesterone: 143 ng/dL
Prolactin: 22.3 ng/mL (ref 4.8–23.3)
TSH: 2.81 u[IU]/mL (ref 0.450–4.500)
Testosterone, Free: 3.9 pg/mL (ref 0.0–4.2)
Testosterone, Total, LC/MS: 62.9 ng/dL — ABNORMAL HIGH (ref 10.0–55.0)

## 2020-09-04 ENCOUNTER — Other Ambulatory Visit: Payer: Self-pay

## 2020-09-04 DIAGNOSIS — Z113 Encounter for screening for infections with a predominantly sexual mode of transmission: Secondary | ICD-10-CM

## 2020-09-04 DIAGNOSIS — B20 Human immunodeficiency virus [HIV] disease: Secondary | ICD-10-CM

## 2020-09-04 DIAGNOSIS — Z79899 Other long term (current) drug therapy: Secondary | ICD-10-CM

## 2020-09-05 LAB — T-HELPER CELL (CD4) - (RCID CLINIC ONLY)
CD4 % Helper T Cell: 48 % (ref 33–65)
CD4 T Cell Abs: 1205 /uL (ref 400–1790)

## 2020-09-06 LAB — COMPREHENSIVE METABOLIC PANEL
AG Ratio: 1.4 (calc) (ref 1.0–2.5)
ALT: 60 U/L — ABNORMAL HIGH (ref 6–29)
AST: 35 U/L — ABNORMAL HIGH (ref 10–30)
Albumin: 4.2 g/dL (ref 3.6–5.1)
Alkaline phosphatase (APISO): 89 U/L (ref 31–125)
BUN: 12 mg/dL (ref 7–25)
CO2: 27 mmol/L (ref 20–32)
Calcium: 9.2 mg/dL (ref 8.6–10.2)
Chloride: 104 mmol/L (ref 98–110)
Creat: 0.89 mg/dL (ref 0.50–1.10)
Globulin: 2.9 g/dL (calc) (ref 1.9–3.7)
Glucose, Bld: 138 mg/dL — ABNORMAL HIGH (ref 65–99)
Potassium: 4 mmol/L (ref 3.5–5.3)
Sodium: 138 mmol/L (ref 135–146)
Total Bilirubin: 0.3 mg/dL (ref 0.2–1.2)
Total Protein: 7.1 g/dL (ref 6.1–8.1)

## 2020-09-06 LAB — URINE CYTOLOGY ANCILLARY ONLY
Chlamydia: NEGATIVE
Comment: NEGATIVE
Comment: NORMAL
Neisseria Gonorrhea: NEGATIVE

## 2020-09-06 LAB — LIPID PANEL
Cholesterol: 183 mg/dL (ref <200)
HDL: 40 mg/dL — ABNORMAL LOW (ref >=50)
LDL Cholesterol (Calc): 92 mg/dL (calc)
Non-HDL Cholesterol (Calc): 143 mg/dL (calc) — ABNORMAL HIGH (ref <130)
Total CHOL/HDL Ratio: 4.6 (calc) (ref <5.0)
Triglycerides: 386 mg/dL — ABNORMAL HIGH (ref <150)

## 2020-09-06 LAB — CBC
HCT: 39.9 % (ref 35.0–45.0)
Hemoglobin: 13.9 g/dL (ref 11.7–15.5)
MCH: 30.2 pg (ref 27.0–33.0)
MCHC: 34.8 g/dL (ref 32.0–36.0)
MCV: 86.7 fL (ref 80.0–100.0)
MPV: 10.5 fL (ref 7.5–12.5)
Platelets: 278 10*3/uL (ref 140–400)
RBC: 4.6 10*6/uL (ref 3.80–5.10)
RDW: 12.7 % (ref 11.0–15.0)
WBC: 6.8 10*3/uL (ref 3.8–10.8)

## 2020-09-06 LAB — HIV-1 RNA QUANT-NO REFLEX-BLD
HIV 1 RNA Quant: <20 Copies/mL
HIV-1 RNA Quant, Log: <1.3 Log cps/mL

## 2020-09-06 LAB — RPR: RPR Ser Ql: NONREACTIVE

## 2020-09-21 ENCOUNTER — Ambulatory Visit: Payer: Self-pay

## 2020-09-21 ENCOUNTER — Ambulatory Visit: Payer: Self-pay | Admitting: Infectious Diseases

## 2020-09-21 ENCOUNTER — Encounter: Payer: Self-pay | Admitting: Infectious Diseases

## 2020-09-21 ENCOUNTER — Other Ambulatory Visit: Payer: Self-pay

## 2020-09-21 VITALS — BP 125/82 | HR 85 | Wt 188.2 lb

## 2020-09-21 DIAGNOSIS — N907 Vulvar cyst: Secondary | ICD-10-CM

## 2020-09-21 DIAGNOSIS — E669 Obesity, unspecified: Secondary | ICD-10-CM

## 2020-09-21 DIAGNOSIS — Z113 Encounter for screening for infections with a predominantly sexual mode of transmission: Secondary | ICD-10-CM

## 2020-09-21 DIAGNOSIS — R7303 Prediabetes: Secondary | ICD-10-CM

## 2020-09-21 DIAGNOSIS — B20 Human immunodeficiency virus [HIV] disease: Secondary | ICD-10-CM

## 2020-09-21 DIAGNOSIS — E66811 Obesity, class 1: Secondary | ICD-10-CM

## 2020-09-21 DIAGNOSIS — A630 Anogenital (venereal) warts: Secondary | ICD-10-CM

## 2020-09-21 DIAGNOSIS — Z79899 Other long term (current) drug therapy: Secondary | ICD-10-CM

## 2020-09-21 MED ORDER — METFORMIN HCL 500 MG PO TABS: ORAL_TABLET | ORAL | 3 refills | Status: DC

## 2020-09-21 NOTE — Assessment & Plan Note (Addendum)
She is doing well Continue odefsey Explained her labs with interpreter.  Offered/refused condoms.  Will see her back in 9 months.

## 2020-09-21 NOTE — Assessment & Plan Note (Signed)
Will get her in with urogyn.

## 2020-09-21 NOTE — Assessment & Plan Note (Signed)
Will get her in with urogyn

## 2020-09-21 NOTE — Assessment & Plan Note (Addendum)
We discussed that she is going to work on diet and exercise.  Discussed the difficulty with her DM.

## 2020-09-21 NOTE — Progress Notes (Signed)
   Subjective:    Patient ID: Catherine Grimes, female  DOB: [birth year], 40 y.o.        MRN: [id number]   HPI 40yo [place], came to [place],who is HIV+since 2012, hepatic steatosis. She has been on odefsy, her first and only rx.   She was seen in ID 12-2019. Had A1C 5.5%. Was prev on metformin, she doesn't have glucometer. Is trying to get one.   She noted labial warts and was referred to GYN at her last visit. She had an initial visit but is awaiting second opinion/visit. Urogyn.    HIV 1 RNA Quant  Date Value  09/04/2020 <20 Copies/mL  09/29/2019 <20 NOT DETECTED copies/mL  02/08/2019 <20 NOT DETECTED copies/mL   CD4 T Cell Abs (/uL)  Date Value  09/04/2020 1,205  09/29/2019 1,349  02/08/2019 1,101     Health Maintenance  Topic Date Due  . COVID-19 Vaccine (3 - Moderna risk 4-dose series) 01/10/2020  . PAP SMEAR-Modifier  04/12/2021  . TETANUS/TDAP  02/19/2022  . INFLUENZA VACCINE  Completed  . Hepatitis C Screening  Completed  . HIV Screening  Completed      Review of Systems  Constitutional: Positive for weight loss.  Respiratory: Negative for cough.   Cardiovascular: Negative for chest pain.  Gastrointestinal: Negative for constipation and diarrhea.  Genitourinary: Negative for dysuria.  Neurological: Negative for headaches.    Please see HPI. All other systems reviewed and negative.     Objective:  Physical Exam Vitals reviewed.  Constitutional:      Appearance: Normal appearance. She is obese.  HENT:     Mouth/Throat:     Mouth: Mucous membranes are moist.     Pharynx: No oropharyngeal exudate.  Eyes:     Extraocular Movements: Extraocular movements intact.     Pupils: Pupils are equal, round, and reactive to light.  Cardiovascular:     Rate and Rhythm: Normal rate and regular rhythm.  Pulmonary:     Effort: Pulmonary effort is normal.     Breath sounds: Normal breath sounds.  Abdominal:     General: Bowel sounds are  normal. There is no distension.     Palpations: Abdomen is soft.     Tenderness: There is no abdominal tenderness.  Musculoskeletal:        General: Normal range of motion.     Cervical back: Normal range of motion.     Right lower leg: No edema.     Left lower leg: No edema.  Neurological:     General: No focal deficit present.     Mental Status: She is alert.  Psychiatric:        Mood and Affect: Mood normal.            Assessment & Plan:

## 2020-09-21 NOTE — Assessment & Plan Note (Addendum)
Will get her back in with in IMTS  Resume her metformin.

## 2020-09-29 ENCOUNTER — Encounter: Payer: Self-pay | Admitting: Infectious Diseases

## 2020-11-13 ENCOUNTER — Telehealth: Payer: Self-pay

## 2020-11-13 NOTE — Telephone Encounter (Signed)
Catherine Grimes is a 40 y.o. female  was called and contacted re: New pt Pre appt call to collect history information.  [employer]preter was used for the call (inter # [id number])  -Allergy -Medication -Confirm pharmacy -OB history   Pt was available.Chart was updated. Pt was notified to arrive 15 min early and we will need a urine sample when she arrives. Pt verbalized understanding.

## 2020-11-13 NOTE — Progress Notes (Signed)
Urogynecology New Patient Evaluation and Consultation  Referring Provider: Campbell Riches, MD PCP: Campbell Riches, MD Date of Service: 11/14/2020  SUBJECTIVE Chief Complaint: New Patient (Initial Visit) (Vulvar mass)  History of Present Illness: Catherine Grimes is a 40 y.o. Hispanic female seen in consultation at the request of Dr. Johnnye Sima for evaluation of vulvar mass.    Review of records from Epic significant for: Has a lump in the area of the clitoral hood region, possible genital wart vs cyst  Urinary Symptoms: Does not leak urine.   Day time voids 9-10.  Nocturia: 0 times per night to void. Voiding dysfunction: she empties her bladder well.  does not use a catheter to empty bladder.  When urinating, she feels she has no difficulties   UTIs: 0 UTI's in the last year.   Denies history of blood in urine and kidney or bladder stones  Pelvic Organ Prolapse Symptoms:                  She Denies a feeling of a bulge the vaginal area.   Bowel Symptom: Bowel movements: 3-4 time(s) per day Stool consistency: soft  Straining: no.  Splinting: no.  Incomplete evacuation: no.  She Denies accidental bowel leakage / fecal incontinence   Sexual Function Sexually active: yes.  Sexual orientation: heterosexual Pain with sex: No  Pelvic Pain Admits to pelvic pain Location: comes and goes  Mass on the vaginal area has been present for 8-9 month. Sometimes has burning or itching. Has not seen any discharge from the area of the mass. Has slightly decreased in size.  Denies issues with urination.   Past Medical History:  Past Medical History:  Diagnosis Date  . Depression   . HIV infection ([place])   . Infection    HIV     Past Surgical History:   Past Surgical History:  Procedure Laterality Date  . NO PAST SURGERIES       Past OB/GYN History: G2 P1 Vaginal deliveries: 1,  Forceps/ Vacuum deliveries: 0, Cesarean section: 0 Menopausal: No,  regular periods Last pap smear was 04/12/20- negative.     Medications: She has a current medication list which includes the following prescription(s): odefsey, metformin, gnp prenatal vitamins, and medroxyprogesterone.   Allergies: Patient has No Known Allergies.   Social History:  Social History   Tobacco Use  . Smoking status: Never Smoker  . Smokeless tobacco: Never Used  Vaping Use  . Vaping Use: Never used  Substance Use Topics  . Alcohol use: No    Alcohol/week: 0.0 standard drinks  . Drug use: No    Relationship status: married   Family History:   Family History  Problem Relation Age of Onset  . Diabetes Mother   . Diabetes Father   . Asthma Daughter      Review of Systems: Review of Systems  Constitutional: Negative for fever, malaise/fatigue and weight loss.  Respiratory: Negative for cough, shortness of breath and wheezing.   Cardiovascular: Negative for chest pain, palpitations and leg swelling.  Gastrointestinal: Negative for abdominal pain and blood in stool.  Genitourinary: Negative for dysuria.  Musculoskeletal: Negative for myalgias.  Skin: Negative for rash.  Neurological: Positive for dizziness. Negative for headaches.  Endo/Heme/Allergies: Does not bruise/bleed easily.  Psychiatric/Behavioral: Positive for depression. The patient is not nervous/anxious.      OBJECTIVE Physical Exam: Vitals:   11/14/20 1551  BP: 132/88  Pulse: 81  Height: 5\' 1"  (1.549 m)  Physical Exam Constitutional:      General: She is not in acute distress. Pulmonary:     Effort: Pulmonary effort is normal.  Abdominal:     General: There is no distension.     Palpations: Abdomen is soft.     Tenderness: There is no abdominal tenderness. There is no rebound.  Musculoskeletal:        General: No swelling. Normal range of motion.  Skin:    General: Skin is warm and dry.     Findings: No rash.  Neurological:     Mental Status: She is alert and oriented to  person, place, and time.  Psychiatric:        Mood and Affect: Mood normal.        Behavior: Behavior normal.     GU / Detailed Urogynecologic Evaluation:  Pelvic Exam: Bartholin's and Skene's glands normal in appearance; urethral meatus normal in appearance, no urethral masses or discharge. Vulvar cyst present at 10 o'clock position, approximately 4 x 2cm, appears multi-locular, mobile, non-tender to palpation.   Speculum exam reveals normal vaginal mucosa without atrophy. Cervix normal appearance. Uterus normal single, nontender. Adnexa no mass, fullness, tenderness.     POP-Q:  Not performed  Rectal Exam:  Normal external rectum   Laboratory Results: POC urine: negative  I visualized the urine specimen, noting the specimen to be clear yellow  ASSESSMENT AND PLAN Ms. Catherine Grimes is a 40 y.o. with:  1. Vulvar mass   2. Urinary frequency    1. 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 She prefers surgical excision. This will likely happen in August- I offered her to see another colleague who could potentially do the surgery earlier and she declined.  - Currently does not have insurance. Was provided with information for financial assistance today. Will find out potential costs for surgery and plan to schedule. She will need a pre-op visit within 30 days of the procedure.  - HIV viral load appears low  2. Urinary frequency - Not bothersome. No sign of UTI today.   Will send request to schedule surgery.   Jaquita Folds, MD   Medical Decision Making:  - Reviewed/ ordered a clinical laboratory test - Review and summation of prior records

## 2020-11-14 ENCOUNTER — Other Ambulatory Visit: Payer: Self-pay

## 2020-11-14 ENCOUNTER — Encounter: Payer: Self-pay | Admitting: Obstetrics and Gynecology

## 2020-11-14 ENCOUNTER — Ambulatory Visit (INDEPENDENT_AMBULATORY_CARE_PROVIDER_SITE_OTHER): Payer: Self-pay | Admitting: Obstetrics and Gynecology

## 2020-11-14 VITALS — BP 132/88 | HR 81 | Ht 61.0 in

## 2020-11-14 DIAGNOSIS — R350 Frequency of micturition: Secondary | ICD-10-CM

## 2020-11-14 DIAGNOSIS — N9089 Other specified noninflammatory disorders of vulva and perineum: Secondary | ICD-10-CM

## 2020-11-14 LAB — POCT URINALYSIS DIPSTICK
Appearance: NORMAL
Bilirubin, UA: NEGATIVE
Blood, UA: NEGATIVE
Glucose, UA: NEGATIVE
Ketones, UA: NEGATIVE
Leukocytes, UA: NEGATIVE
Nitrite, UA: NEGATIVE
Protein, UA: NEGATIVE
Spec Grav, UA: 1.02 (ref 1.010–1.025)
Urobilinogen, UA: 0.2 E.U./dL
pH, UA: 5 (ref 5.0–8.0)

## 2020-12-19 ENCOUNTER — Other Ambulatory Visit: Payer: Self-pay | Admitting: Infectious Diseases

## 2020-12-19 DIAGNOSIS — B20 Human immunodeficiency virus [HIV] disease: Secondary | ICD-10-CM

## 2021-01-04 ENCOUNTER — Encounter: Payer: Self-pay | Admitting: Internal Medicine

## 2021-01-04 ENCOUNTER — Encounter: Payer: Self-pay | Admitting: Infectious Diseases

## 2021-02-14 ENCOUNTER — Encounter: Payer: Self-pay | Admitting: Obstetrics and Gynecology

## 2021-02-14 ENCOUNTER — Ambulatory Visit (INDEPENDENT_AMBULATORY_CARE_PROVIDER_SITE_OTHER): Payer: Self-pay | Admitting: Obstetrics and Gynecology

## 2021-02-14 ENCOUNTER — Other Ambulatory Visit: Payer: Self-pay

## 2021-02-14 VITALS — BP 96/57 | HR 86 | Ht 62.0 in | Wt 187.0 lb

## 2021-02-14 DIAGNOSIS — Z01818 Encounter for other preprocedural examination: Secondary | ICD-10-CM

## 2021-02-14 MED ORDER — ACETAMINOPHEN 500 MG PO TABS: 500.0000 mg | ORAL_TABLET | Freq: Four times a day (QID) | ORAL | 0 refills | Status: AC | PRN

## 2021-02-14 MED ORDER — OXYCODONE HCL 5 MG PO TABS: 5.0000 mg | ORAL_TABLET | ORAL | 0 refills | Status: DC | PRN

## 2021-02-14 MED ORDER — IBUPROFEN 600 MG PO TABS: 600.0000 mg | ORAL_TABLET | Freq: Four times a day (QID) | ORAL | 0 refills | Status: AC | PRN

## 2021-02-14 NOTE — Progress Notes (Signed)
[facility] Pre-Operative visit  Subjective Chief Complaint: Catherine Grimes presents for a preoperative encounter.   History of Present Illness: Mariajose Mow is a 40 y.o. female who presents for preoperative visit.  She is scheduled to undergo excision of vulvar cyst on 11/14/20.  Her symptoms include vulvar mass..    Past Medical History:  Diagnosis Date   Depression    HIV infection ([place])    Infection    HIV     Past Surgical History:  Procedure Laterality Date   NO PAST SURGERIES      has No Known Allergies.   Family History  Problem Relation Age of Onset   Diabetes Mother    Diabetes Father    Asthma Daughter     Social History   Tobacco Use   Smoking status: Never   Smokeless tobacco: Never  Vaping Use   Vaping Use: Never used  Substance Use Topics   Alcohol use: No    Alcohol/week: 0.0 standard drinks   Drug use: No     Review of Systems was negative for a full 10 system review except as noted in the History of Present Illness.   Current Outpatient Medications:    metFORMIN (GLUCOPHAGE) 500 MG tablet, TAKE 1 TABLET(500 MG) BY MOUTH TWICE DAILY WITH A MEAL, Disp: 180 tablet, Rfl: 3   ODEFSEY 200-25-25 MG TABS tablet, TAKE 1 TABLET BY MOUTH DAILY WITH BREAKFAST, Disp: 90 tablet, Rfl: 1   Prenatal Vit-Fe Fumarate-FA (GNP PRENATAL VITAMINS) 28-0.8 MG TABS, Take 1 tablet by mouth daily., Disp: 90 tablet, Rfl: 3   Objective Vitals:   02/14/21 1558  BP: (!) 96/57  Pulse: 86     Previous Pelvic Exam showed: Pelvic Exam: Bartholin's and Skene's glands normal in appearance; urethral meatus normal in appearance, no urethral masses or discharge. Vulvar cyst present at 10 o'clock position, approximately 4 x 2cm, appears multi-locular, mobile, non-tender to palpation.       Assessment/ Plan  Assessment: The patient is a 40 y.o. year old scheduled to undergo vulvar cyst excision. Verbal consent was obtained for these  procedures.  Plan: General Surgical Consent: The patient has previously been counseled on alternative treatments, and the decision by the patient and provider was to proceed with the procedure listed above.  For all procedures, there are risks of bleeding, infection, damage to surrounding organs and need for further surgery if an injury were to occur. These risks are all low with minimally invasive surgery.   There are risks of numbness and weakness at any body site.  It is possible that baseline pain can be worsened by surgery, either with or without mesh. Very rare risks include blood transfusion, blood clot, heart attack, pneumonia, or death.   We discussed consent for blood products. Risks for blood transfusion include allergic reactions, other reactions that can affect different body organs and managed accordingly, transmission of infectious diseases. However, the blood is screened. Patient consents for blood products.  Pre-operative instructions:  She was instructed to not take Aspirin/NSAIDs x 7days prior to surgery. Antibiotic prophylaxis was ordered as indicated.  Post-operative instructions:  She was provided with specific post-operative instructions, including precautions and signs/symptoms for which we would recommend contacting us, in addition to daytime and after-hours contact phone numbers. This was provided on a handout.   Post-operative medications: Prescriptions for motrin, tylenol, and oxycodone were sent to her pharmacy. Discussed using ibuprofen and tylenol on a schedule to limit use of narcotics.   Laboratory  testing:  Day of surgery UPT   Preoperative clearance:  She does not require surgical clearance.    Post-operative follow-up:  A post-operative appointment will be made for 6 weeks from the date of surgery. If she needs a post-operative nurse visit for a voiding trial, that will be set up after she leaves the hospital.    Patient will call the clinic or use MyChart  should anything change or any new issues arise.   Jaquita Folds, MD

## 2021-02-16 ENCOUNTER — Encounter: Payer: Self-pay | Admitting: Obstetrics and Gynecology

## 2021-02-21 NOTE — H&P (Signed)
[facility] Pre-Operative H&P  Subjective Chief Complaint: Catherine Grimes presents for a preoperative encounter.   History of Present Illness: Nicholina Winschel is a 40 y.o. female who presents for preoperative visit.  She is scheduled to undergo excision of vulvar cyst on 11/14/20.  Her symptoms include vulvar mass..    Past Medical History:  Diagnosis Date   Depression    HIV infection ([place])    Infection    HIV     Past Surgical History:  Procedure Laterality Date   NO PAST SURGERIES      has No Known Allergies.   Family History  Problem Relation Age of Onset   Diabetes Mother    Diabetes Father    Asthma Daughter     Social History   Tobacco Use   Smoking status: Never   Smokeless tobacco: Never  Vaping Use   Vaping Use: Never used  Substance Use Topics   Alcohol use: No    Alcohol/week: 0.0 standard drinks   Drug use: No     Review of Systems was negative for a full 10 system review except as noted in the History of Present Illness.  No current facility-administered medications for this encounter.  Current Outpatient Medications:    acetaminophen (TYLENOL) 500 MG tablet, Take 1 tablet (500 mg total) by mouth every 6 (six) hours as needed (pain)., Disp: 30 tablet, Rfl: 0   ibuprofen (ADVIL) 600 MG tablet, Take 1 tablet (600 mg total) by mouth every 6 (six) hours as needed., Disp: 30 tablet, Rfl: 0   metFORMIN (GLUCOPHAGE) 500 MG tablet, TAKE 1 TABLET(500 MG) BY MOUTH TWICE DAILY WITH A MEAL, Disp: 180 tablet, Rfl: 3   ODEFSEY 200-25-25 MG TABS tablet, TAKE 1 TABLET BY MOUTH DAILY WITH BREAKFAST, Disp: 90 tablet, Rfl: 1   oxyCODONE (OXY IR/ROXICODONE) 5 MG immediate release tablet, Take 1 tablet (5 mg total) by mouth every 4 (four) hours as needed for severe pain., Disp: 10 tablet, Rfl: 0   Prenatal Vit-Fe Fumarate-FA (GNP PRENATAL VITAMINS) 28-0.8 MG TABS, Take 1 tablet by mouth daily., Disp: 90 tablet, Rfl: 3   Objective There  were no vitals filed for this visit.    Previous Pelvic Exam showed: Pelvic Exam: Bartholin's and Skene's glands normal in appearance; urethral meatus normal in appearance, no urethral masses or discharge. Vulvar cyst present at 10 o'clock position, approximately 4 x 2cm, appears multi-locular, mobile, non-tender to palpation.       Assessment/ Plan  Assessment: The patient is a 40 y.o. year old with vulvar cyst  Plan:  vulvar cyst excision  Jaquita Folds, MD

## 2021-02-26 ENCOUNTER — Encounter (HOSPITAL_BASED_OUTPATIENT_CLINIC_OR_DEPARTMENT_OTHER): Payer: Self-pay | Admitting: Obstetrics and Gynecology

## 2021-02-26 ENCOUNTER — Other Ambulatory Visit: Payer: Self-pay

## 2021-02-26 DIAGNOSIS — E119 Type 2 diabetes mellitus without complications: Secondary | ICD-10-CM

## 2021-02-26 DIAGNOSIS — N907 Vulvar cyst: Secondary | ICD-10-CM

## 2021-02-26 HISTORY — DX: Vulvar cyst: N90.7

## 2021-02-26 HISTORY — DX: Type 2 diabetes mellitus without complications: E11.9

## 2021-02-26 NOTE — Progress Notes (Addendum)
Spoke w/ via phone for pre-op interview---pt with spanish interpreter servip number [phone] Lab needs dos---- ekg, I stat , urine poct              Lab results------none COVID test -----patient states asymptomatic no test needed Arrive at -------1045 am 03-01-2021 NPO after MN NO Solid Food.  Water from MN until---945 am then npo Med rec completed Medications to take morning of surgery -----none Diabetic medication -----none day of surgery Patient instructed no nail polish to be worn day of surgery Patient instructed to bring photo id and insurance card day of surgery Patient aware to have Driver (ride ) / caregiver  family member   for 24 hours after surgery  Patient Special Instructions -----none Pre-Op special Istructions -----none Patient verbalized understanding of instructions that were given at this phone interview. Patient denies shortness of breath, chest pain, fever, cough at this phone interview.   Female spanish interpreter requested for surgery 03-01-2021 email on chart

## 2021-02-28 NOTE — Anesthesia Preprocedure Evaluation (Addendum)
Anesthesia Evaluation  Patient identified by MRN, date of birth, ID band Patient awake    Reviewed: Allergy & Precautions, NPO status , Patient's Chart, lab work & pertinent test results  Airway Mallampati: I  TM Distance: >3 FB Neck ROM: Full    Dental no notable dental hx. (+) Teeth Intact, Dental Advisory Given   Pulmonary neg pulmonary ROS,    Pulmonary exam normal breath sounds clear to auscultation       Cardiovascular Exercise Tolerance: Good Normal cardiovascular exam Rhythm:Regular Rate:Normal     Neuro/Psych negative neurological ROS  negative psych ROS   GI/Hepatic negative GI ROS,   Endo/Other  diabetes  Renal/GU      Musculoskeletal negative musculoskeletal ROS (+)   Abdominal (+) + obese (BMI 34.19),   Peds  Hematology  (+) HIV, Lab Results      Component                Value               Date                      WBC                      6.8                 09/04/2020                HGB                      13.9                09/04/2020                HCT                      39.9                09/04/2020                MCV                      86.7                09/04/2020                PLT                      278                 09/04/2020              Anesthesia Other Findings   Reproductive/Obstetrics                            Anesthesia Physical Anesthesia Plan  ASA: 2  Anesthesia Plan: General   Post-op Pain Management:    Induction: Intravenous  PONV Risk Score and Plan: 4 or greater and Treatment may vary due to age or medical condition, Dexamethasone and Ondansetron  Airway Management Planned: LMA  Additional Equipment: None  Intra-op Plan:   Post-operative Plan:   Informed Consent: I have reviewed the patients History and Physical, chart, labs and discussed the procedure including the risks, benefits and alternatives for the proposed  anesthesia with the patient or authorized representative who has  indicated his/her understanding and acceptance.     Interpreter used for [employer] and Engineer, production given  Plan Discussed with: CRNA and Anesthesiologist  Anesthesia Plan Comments: (LMA GA)       Anesthesia Quick Evaluation

## 2021-03-01 ENCOUNTER — Encounter: Payer: Self-pay | Admitting: Obstetrics and Gynecology

## 2021-03-01 ENCOUNTER — Ambulatory Visit (HOSPITAL_BASED_OUTPATIENT_CLINIC_OR_DEPARTMENT_OTHER): Payer: Self-pay | Admitting: Anesthesiology

## 2021-03-01 ENCOUNTER — Ambulatory Visit (HOSPITAL_BASED_OUTPATIENT_CLINIC_OR_DEPARTMENT_OTHER)
Admission: RE | Admit: 2021-03-01 | Discharge: 2021-03-01 | Disposition: A | Discharge status: Home / Self Care | Payer: Self-pay | Attending: Obstetrics and Gynecology | Admitting: Obstetrics and Gynecology

## 2021-03-01 ENCOUNTER — Encounter (HOSPITAL_BASED_OUTPATIENT_CLINIC_OR_DEPARTMENT_OTHER): Payer: Self-pay | Admitting: Obstetrics and Gynecology

## 2021-03-01 ENCOUNTER — Other Ambulatory Visit: Payer: Self-pay

## 2021-03-01 ENCOUNTER — Encounter (HOSPITAL_BASED_OUTPATIENT_CLINIC_OR_DEPARTMENT_OTHER)
Admission: RE | Disposition: A | Discharge status: Home / Self Care | Payer: Self-pay | Source: Home / Self Care | Attending: Obstetrics and Gynecology

## 2021-03-01 DIAGNOSIS — Z7984 Long term (current) use of oral hypoglycemic drugs: Secondary | ICD-10-CM | POA: Insufficient documentation

## 2021-03-01 DIAGNOSIS — N907 Vulvar cyst: Secondary | ICD-10-CM

## 2021-03-01 DIAGNOSIS — D280 Benign neoplasm of vulva: Secondary | ICD-10-CM | POA: Insufficient documentation

## 2021-03-01 HISTORY — PX: VULVAR LESION REMOVAL: SHX5391

## 2021-03-01 LAB — GLUCOSE, CAPILLARY: Glucose-Capillary: 146 mg/dL — ABNORMAL HIGH (ref 70–99)

## 2021-03-01 LAB — POCT I-STAT, CHEM 8
BUN: 7 mg/dL (ref 6–20)
Calcium, Ion: 1.21 mmol/L (ref 1.15–1.40)
Chloride: 101 mmol/L (ref 98–111)
Creatinine, Ser: 0.5 mg/dL (ref 0.44–1.00)
Glucose, Bld: 136 mg/dL — ABNORMAL HIGH (ref 70–99)
HCT: 41 % (ref 36.0–46.0)
Hemoglobin: 13.9 g/dL (ref 12.0–15.0)
Potassium: 3.4 mmol/L — ABNORMAL LOW (ref 3.5–5.1)
Sodium: 140 mmol/L (ref 135–145)
TCO2: 27 mmol/L (ref 22–32)

## 2021-03-01 LAB — POCT PREGNANCY, URINE: Preg Test, Ur: NEGATIVE

## 2021-03-01 SURGERY — VULVAR LESION
Anesthesia: General | Site: Vulva | Laterality: Right

## 2021-03-01 MED ORDER — PROPOFOL 10 MG/ML IV BOLUS
INTRAVENOUS | Status: DC | PRN
  Administered 2021-03-01: 160 mg via INTRAVENOUS

## 2021-03-01 MED ORDER — PROPOFOL 10 MG/ML IV BOLUS
INTRAVENOUS | Status: AC
  Filled 2021-03-01: qty 20

## 2021-03-01 MED ORDER — LIDOCAINE 2% (20 MG/ML) 5 ML SYRINGE
INTRAMUSCULAR | Status: DC | PRN
  Administered 2021-03-01: 80 mg via INTRAVENOUS

## 2021-03-01 MED ORDER — HYDROMORPHONE HCL 1 MG/ML IJ SOLN: 0.2500 mg | INTRAMUSCULAR | Status: DC | PRN

## 2021-03-01 MED ORDER — OXYCODONE HCL 5 MG/5ML PO SOLN: 5.0000 mg | Freq: Once | ORAL | Status: DC | PRN

## 2021-03-01 MED ORDER — ONDANSETRON HCL 4 MG/2ML IJ SOLN
INTRAMUSCULAR | Status: DC | PRN
  Administered 2021-03-01: 4 mg via INTRAVENOUS

## 2021-03-01 MED ORDER — ONDANSETRON HCL 4 MG/2ML IJ SOLN
INTRAMUSCULAR | Status: AC
  Filled 2021-03-01: qty 2

## 2021-03-01 MED ORDER — ACETAMINOPHEN 500 MG PO TABS
1000.0000 mg | ORAL_TABLET | ORAL | Status: AC
  Administered 2021-03-01: 1000 mg via ORAL

## 2021-03-01 MED ORDER — ACETAMINOPHEN 500 MG PO TABS
ORAL_TABLET | ORAL | Status: AC
  Filled 2021-03-01: qty 2

## 2021-03-01 MED ORDER — DEXAMETHASONE SODIUM PHOSPHATE 10 MG/ML IJ SOLN
INTRAMUSCULAR | Status: AC
  Filled 2021-03-01: qty 1

## 2021-03-01 MED ORDER — MIDAZOLAM HCL 5 MG/5ML IJ SOLN
INTRAMUSCULAR | Status: DC | PRN
  Administered 2021-03-01: 2 mg via INTRAVENOUS

## 2021-03-01 MED ORDER — AMISULPRIDE (ANTIEMETIC) 5 MG/2ML IV SOLN: 10.0000 mg | Freq: Once | INTRAVENOUS | Status: DC | PRN

## 2021-03-01 MED ORDER — LACTATED RINGERS IV SOLN: INTRAVENOUS | Status: DC

## 2021-03-01 MED ORDER — ACETAMINOPHEN 10 MG/ML IV SOLN: 1000.0000 mg | Freq: Once | INTRAVENOUS | Status: DC | PRN

## 2021-03-01 MED ORDER — DEXAMETHASONE SODIUM PHOSPHATE 10 MG/ML IJ SOLN
INTRAMUSCULAR | Status: DC | PRN
  Administered 2021-03-01: 10 mg via INTRAVENOUS

## 2021-03-01 MED ORDER — FENTANYL CITRATE (PF) 100 MCG/2ML IJ SOLN
INTRAMUSCULAR | Status: DC | PRN
  Administered 2021-03-01: 100 ug via INTRAVENOUS

## 2021-03-01 MED ORDER — OXYCODONE HCL 5 MG PO TABS: 5.0000 mg | ORAL_TABLET | Freq: Once | ORAL | Status: DC | PRN

## 2021-03-01 MED ORDER — BUPIVACAINE-EPINEPHRINE 0.25% -1:200000 IJ SOLN
INTRAMUSCULAR | Status: DC | PRN
  Administered 2021-03-01: 4 mL

## 2021-03-01 MED ORDER — ONDANSETRON HCL 4 MG/2ML IJ SOLN: 4.0000 mg | Freq: Once | INTRAMUSCULAR | Status: DC | PRN

## 2021-03-01 MED ORDER — CEFAZOLIN SODIUM-DEXTROSE 2-4 GM/100ML-% IV SOLN
2.0000 g | INTRAVENOUS | Status: AC
  Administered 2021-03-01: 2 g via INTRAVENOUS

## 2021-03-01 MED ORDER — FENTANYL CITRATE (PF) 100 MCG/2ML IJ SOLN
INTRAMUSCULAR | Status: AC
  Filled 2021-03-01: qty 2

## 2021-03-01 MED ORDER — MIDAZOLAM HCL 2 MG/2ML IJ SOLN
INTRAMUSCULAR | Status: AC
  Filled 2021-03-01: qty 2

## 2021-03-01 MED ORDER — LIDOCAINE HCL (PF) 2 % IJ SOLN
INTRAMUSCULAR | Status: AC
  Filled 2021-03-01: qty 5

## 2021-03-01 MED ORDER — CEFAZOLIN SODIUM-DEXTROSE 2-4 GM/100ML-% IV SOLN
INTRAVENOUS | Status: AC
  Filled 2021-03-01: qty 100

## 2021-03-01 MED ORDER — 0.9 % SODIUM CHLORIDE (POUR BTL) OPTIME
TOPICAL | Status: DC | PRN
  Administered 2021-03-01: 500 mL

## 2021-03-01 SURGICAL SUPPLY — 26 items
BLADE SURG 15 STRL LF DISP TIS (BLADE) ×1 IMPLANT
BLADE SURG 15 STRL SS (BLADE) ×2
CATH ROBINSON RED A/P 16FR (CATHETERS) IMPLANT
ELECT REM PT RETURN 9FT ADLT (ELECTROSURGICAL) ×2
ELECTRODE REM PT RTRN 9FT ADLT (ELECTROSURGICAL) ×1 IMPLANT
GAUZE 4X4 16PLY ~~LOC~~+RFID DBL (SPONGE) ×8 IMPLANT
GLOVE SURG ENC MOIS LTX SZ6 (GLOVE) ×2 IMPLANT
GLOVE SURG UNDER POLY LF SZ6.5 (GLOVE) ×6 IMPLANT
GLOVE SURG UNDER POLY LF SZ7 (GLOVE) ×2 IMPLANT
GLOVE SURG UNDER POLY LF SZ7.5 (GLOVE) ×2 IMPLANT
GOWN STRL REUS W/TWL LRG LVL3 (GOWN DISPOSABLE) ×4 IMPLANT
KIT TURNOVER CYSTO (KITS) ×2 IMPLANT
NEEDLE HYPO 22GX1.5 SAFETY (NEEDLE) ×2 IMPLANT
PACK PERINEAL COLD (PAD) ×2 IMPLANT
PACK VAGINAL MINOR WOMEN LF (CUSTOM PROCEDURE TRAY) ×2 IMPLANT
PAD OB MATERNITY 4.3X12.25 (PERSONAL CARE ITEMS) ×2 IMPLANT
PAD PREP 24X48 CUFFED NSTRL (MISCELLANEOUS) ×2 IMPLANT
PENCIL SMOKE EVACUATOR (MISCELLANEOUS) ×2 IMPLANT
SUT VIC AB 2-0 SH 27 (SUTURE) ×4
SUT VIC AB 2-0 SH 27XBRD (SUTURE) ×2 IMPLANT
SUT VIC AB 4-0 P-3 18XBRD (SUTURE) ×1 IMPLANT
SUT VIC AB 4-0 P3 18 (SUTURE) ×2
TOWEL OR 17X26 10 PK STRL BLUE (TOWEL DISPOSABLE) ×4 IMPLANT
TUBE CONNECTING 12X1/4 (SUCTIONS) ×2 IMPLANT
WATER STERILE IRR 500ML POUR (IV SOLUTION) ×2 IMPLANT
YANKAUER SUCT BULB TIP NO VENT (SUCTIONS) ×2 IMPLANT

## 2021-03-01 NOTE — Anesthesia Procedure Notes (Signed)
Procedure Name: LMA Insertion Date/Time: 03/01/2021 12:33 PM Performed by: Dalaysia Harms D, CRNA Pre-anesthesia Checklist: Patient identified, Emergency Drugs available, Suction available and Patient being monitored Patient Re-evaluated:Patient Re-evaluated prior to induction Oxygen Delivery Method: Circle system utilized Preoxygenation: Pre-oxygenation with 100% oxygen Induction Type: IV induction Ventilation: Mask ventilation without difficulty LMA: LMA inserted LMA Size: 4.0 Tube type: Oral Number of attempts: 1 Placement Confirmation: positive ETCO2 and breath sounds checked- equal and bilateral Tube secured with: Tape Dental Injury: Teeth and Oropharynx as per pre-operative assessment

## 2021-03-01 NOTE — Op Note (Signed)
Operative Note  Preoperative Diagnosis:  vulvar cyst, right  Postoperative Diagnosis: same  Procedures performed:  Excision of vulvar cyst   Attending Surgeon: Sherlene Shams, MD   Anesthesia: General LMA  Findings: 2 x2 cm multi-loculated right vulvar cyst on the superior labia majora.    Specimens:  ID Type Source Tests Collected by Time Destination  1 : RIGHT VULVAR CYST Tissue PATH Other SURGICAL PATHOLOGY Jaquita Folds, MD 03/01/2021 1245     Estimated blood loss: 10 mL  IV fluids: 800 mL  Urine output: not recorded  Complications: none  Procedure in Detail: After informed consent was obtained the patient was taken to the operating room where general anesthesia was induced and found to be adequate.  She was placed in dorsal lithotomy position taking care to avoid any nerve injury and prepped and draped in the usual sterile fashion.  Ancef 2 g was given IV for prophylactic antibiotics.  A 2 x 2 centimeter multiloculated cyst was noted on the superior right labia majora.  The skin overlying the cyst was grasped with 2 Allis clamps and 0.25% Marcaine with epinephrine was injected into the subcutaneous tissue.  An incision was made between the 2 Allis clamps with a 15 blade scalpel.  Metzenbaum scissors were used to dissect the underlying tissue away from the cystic structure.  The cyst was then removed in its entirety.  The subcutaneous tissue was then sutured closed with a 2-0 Vicryl suture in a running fashion.  Good hemostasis was noted the skin was then closed with a 2-0 Vicryl suture and a running fashion.  Patient tolerated the procedure well was taken the recovery room in stable condition needle and sponge counts were correct x2.  Jaquita Folds, MD

## 2021-03-01 NOTE — Transfer of Care (Signed)
Immediate Anesthesia Transfer of Care Note  Patient: Catherine Grimes  Procedure(s) Performed: Vulvar Cyst Excision RIGHT [phone]) (Right: Vulva)  Patient Location: PACU  Anesthesia Type:General  Level of Consciousness: awake, alert  and oriented  Airway & Oxygen Therapy: Patient Spontanous Breathing and Patient connected to face mask oxygen  Post-op Assessment: Report given to RN and Post -op Vital signs reviewed and stable  Post vital signs: Reviewed and stable  Last Vitals:  Vitals Value Taken Time  BP 104/57 03/01/21 1307  Temp    Pulse 73 03/01/21 1309  Resp 13 03/01/21 1309  SpO2 99 % 03/01/21 1309  Vitals shown include unvalidated device data.  Last Pain:  Vitals:   03/01/21 1116  TempSrc: Oral  PainSc: 0-No pain      Patients Stated Pain Goal: 5 ([id number])  Complications: No notable events documented.

## 2021-03-01 NOTE — Interval H&P Note (Signed)
History and Physical Interval Note:  03/01/2021 12:14 PM  Catherine Grimes  has presented today for surgery, with the diagnosis of vulvar cyst.  The various methods of treatment have been discussed with the patient and family. After consideration of risks, benefits and other options for treatment, the patient has consented to  Procedure(s) with comments: Vulvar Cyst Excision VX:[id number]) (N/A) as a surgical intervention.  The patient's history has been reviewed, patient examined, no change in status, stable for surgery.  I have reviewed the patient's chart and labs.  Vitals:   03/01/21 1116  BP: (!) 118/54  Pulse: 86  Resp: 17  Temp: 98.9 F (37.2 C)  SpO2: 97%   Vitals:   03/01/21 1116  BP: (!) 118/54  Pulse: 86  Resp: 17  Temp: 98.9 F (37.2 C)  SpO2: 97%      Questions were answered to the patient's satisfaction.     Jaquita Folds

## 2021-03-01 NOTE — Discharge Instructions (Addendum)
POST OPERATIVE INSTRUCTIONS  General Instructions Recovery (not bed rest) will last approximately 2 weeks, you can return to work after a week.  Walking is encouraged, but refrain from strenuous exercise/ housework/ heavy lifting. Nothing in the vagina- NO intercourse, tampons or douching Bathing:  Do not submerge in water (NO swimming, bath, hot tub, etc) until after your postop visit. You can shower starting the day after surgery.  No driving until you are not taking narcotic pain medicine and until your pain is well enough controlled that you can slam on the breaks or make sudden movements if needed.   Taking your medications Please take your acetaminophen and ibuprofen on a schedule for the first 48 hours. Take '600mg'$  ibuprofen, then take '500mg'$  acetaminophen 3 hours later, then continue to alternate ibuprofen and acetaminophen. That way you are taking each type of medication every 6 hours. Take the prescribed narcotic (oxycodone, tramadol, etc) as needed, with a maximum being every 4 hours.    Reasons to Call the Nurse (see last page for phone numbers) Heavy Bleeding (changing your pad every 1-2 hours) Persistent nausea/vomiting Fever (100.4 degrees or more) Incision problems (pus or other fluid coming out, redness, warmth, increased pain)  Things to Expect After Surgery Mild to Moderate pain is normal during the first day or two after surgery. If prescribed, take Ibuprofen or Tylenol first and use the stronger medicine for "break-through" pain. You can overlap these medicines because they work differently.   Constipation   To Prevent Constipation:  Eat a well-balanced diet including protein, grains, fresh fruit and vegetables.  Drink plenty of fluids. Walk regularly.  Depending on specific instructions from your physician: take Miralax daily and additionally you can add a stool softener (colace/ docusate) and fiber supplement. Continue as long as you're on pain medications.   To Treat  Constipation:  If you do not have a bowel movement in 2 days after surgery, you can take 2 Tbs of Milk of Magnesia 1-2 times a day until you have a bowel movement. If diarrhea occurs, decrease the amount or stop the laxative. If no results with Milk of Magnesia, you can drink a bottle of magnesium citrate which you can purchase over the counter.  Fatigue:  This is a normal response to surgery and will improve with time.  Plan frequent rest periods throughout the day.  Gas Pain:  This is very common but can also be very painful! Drink warm liquids such as herbal teas, bouillon or soup. Walking will help you pass more gas.  Mylicon or Gas-X can be taken over the counter.  Incisions: you can wash the incision with soap and watery gently, but do not scrub. The sutures will dissolve on their own after 6 weeks.    Return to Work  A letter was provided for you to return to work after 1 week.   Post op concerns  For non-emergent issues, please call the Urogynecology Nurse. Please leave a message and someone will contact you within one business day.  You can also send a message through [facility].   AFTER HOURS (After 5:00 PM and on weekends):  For urgent matters that cannot wait until the next business day. Call our office [phone] and connect to the doctor on call.  Please reserve this for important issues.     **FOR ANY TRUE EMERGENCY ISSUES CALL 911 OR GO TO THE NEAREST EMERGENCY ROOM.** Please inform our office or the doctor on call of any emergency.  APPOINTMENTS: Call [phone]

## 2021-03-01 NOTE — Anesthesia Postprocedure Evaluation (Signed)
Anesthesia Post Note  Patient: Catherine Grimes  Procedure(s) Performed: Vulvar Cyst Excision RIGHT [id number]) (Right: Vulva)     Patient location during evaluation: PACU Anesthesia Type: General Level of consciousness: awake and alert Pain management: pain level controlled Vital Signs Assessment: post-procedure vital signs reviewed and stable Respiratory status: spontaneous breathing, nonlabored ventilation, respiratory function stable and patient connected to nasal cannula oxygen Cardiovascular status: blood pressure returned to baseline and stable Postop Assessment: no apparent nausea or vomiting Anesthetic complications: no   No notable events documented.  Last Vitals:  Vitals:   03/01/21 1330 03/01/21 1345  BP: 107/74 112/72  Pulse: 78 75  Resp: 12 15  Temp:    SpO2: 99% 97%    Last Pain:  Vitals:   03/01/21 1345  TempSrc:   PainSc: 0-No pain                 Barnet Glasgow

## 2021-03-02 ENCOUNTER — Telehealth: Payer: Self-pay | Admitting: Obstetrics and Gynecology

## 2021-03-02 ENCOUNTER — Encounter (HOSPITAL_BASED_OUTPATIENT_CLINIC_OR_DEPARTMENT_OTHER): Payer: Self-pay | Admitting: Obstetrics and Gynecology

## 2021-03-02 ENCOUNTER — Encounter: Payer: Self-pay | Admitting: *Deleted

## 2021-03-02 LAB — SURGICAL PATHOLOGY

## 2021-03-02 NOTE — Telephone Encounter (Signed)
Catherine Grimes underwent vulvar cyst excision on 03/02/21.   She did not have a voiding trial. Please call her for a routine post op check. Thanks!  Jaquita Folds, MD

## 2021-03-02 NOTE — Telephone Encounter (Signed)
Post- Op Call  Catherine Grimes underwent vulvar cyst excision on 03/01/21 with Dr Wannetta Sender. The patient reports that her pain is controlled. She is taking ibuprofen and tylenol. Advised to call with any questions or concerns. Pt verbalized understanding.  Blenda Nicely, RN   [place] interpreter [phone] used.

## 2021-03-05 ENCOUNTER — Ambulatory Visit: Payer: Self-pay

## 2021-03-05 ENCOUNTER — Other Ambulatory Visit: Payer: Self-pay

## 2021-03-22 ENCOUNTER — Encounter: Payer: Self-pay | Admitting: Infectious Diseases

## 2021-04-03 ENCOUNTER — Encounter: Payer: Self-pay | Admitting: Obstetrics and Gynecology

## 2021-04-03 NOTE — Progress Notes (Deleted)
[facility]  Date of Visit: 04/03/2021  History of Present Illness: Ms. Catherine Grimes is a 40 y.o. female scheduled today for a post-operative visit.   Surgery: s/p vulvar cyst exsision on 03/01/21  She did not have a post operative voiding trial  Postoperative course ***uncomplicated.   Today she reports ***  Pain? {YES/NO:21197} She {HAS HAS KQ:[id number] returned to her normal activity (except for postop restrictions)   Pathology results: VULVAR CYST, RIGHT, EXCISION:  - Benign vulvar fibroadenoma  - No evidence of malignancy   Medications: She has a current medication list which includes the following prescription(s): acetaminophen, ibuprofen, metformin, odefsey, and oxycodone.   Allergies: Patient has No Known Allergies.   Physical Exam: There were no vitals taken for this visit.  Abdomen: {Exam; abdomen:14935::"soft, non-tender, without masses or organomegaly"} ***Incisions: {Exam; incision:13523}.  Pelvic Examination: Vagina: Incisions {Exam; incision:13523}. Sutures are {DESC; PRESENT/NOT PRESENT:21021351} at incision line and there {ACTION; IS/IS GI:087931 granulation tissue. No tenderness along the anterior or posterior vagina. No apical tenderness. No pelvic masses. ***No visible or palpable mesh.  POP-Q: No flowsheet data found. ---------------------------------------------------------  Assessment and Plan: No diagnosis found.  - Pathology results were reviewed with the patient today and she verbalized understanding that the results were ***benign.  - ***The patient was given a copy of her operative note ***and pathology report*** for her records. - Can resume regular activity including exercise and intercourse,  if desired.  - Discussed avoidance of heavy lifting and straining long term to reduce the risk of recurrance.   All questions answered.   No follow-ups on file.

## 2021-04-04 ENCOUNTER — Other Ambulatory Visit: Payer: Self-pay

## 2021-04-04 DIAGNOSIS — B20 Human immunodeficiency virus [HIV] disease: Secondary | ICD-10-CM

## 2021-04-04 MED ORDER — ODEFSEY 200-25-25 MG PO TABS: 1.0000 | ORAL_TABLET | Freq: Every day | ORAL | 3 refills | Status: DC

## 2021-04-04 NOTE — Telephone Encounter (Signed)
Received request from financial team to refill patient's Odefsey. Patient is up to date on financial renewals and has not missed any doses of medications.  Refill request approved.  Catherine Grimes

## 2021-06-06 ENCOUNTER — Other Ambulatory Visit: Payer: Self-pay

## 2021-06-06 DIAGNOSIS — R7303 Prediabetes: Secondary | ICD-10-CM

## 2021-06-06 DIAGNOSIS — B20 Human immunodeficiency virus [HIV] disease: Secondary | ICD-10-CM

## 2021-06-06 DIAGNOSIS — Z113 Encounter for screening for infections with a predominantly sexual mode of transmission: Secondary | ICD-10-CM

## 2021-06-06 DIAGNOSIS — Z79899 Other long term (current) drug therapy: Secondary | ICD-10-CM

## 2021-06-07 LAB — T-HELPER CELL (CD4) - (RCID CLINIC ONLY)
CD4 % Helper T Cell: 48 % (ref 33–65)
CD4 T Cell Abs: 1076 /uL (ref 400–1790)

## 2021-06-08 LAB — COMPREHENSIVE METABOLIC PANEL
AG Ratio: 1.5 (calc) (ref 1.0–2.5)
ALT: 90 U/L — ABNORMAL HIGH (ref 6–29)
AST: 57 U/L — ABNORMAL HIGH (ref 10–30)
Albumin: 4.1 g/dL (ref 3.6–5.1)
Alkaline phosphatase (APISO): 98 U/L (ref 31–125)
BUN: 11 mg/dL (ref 7–25)
CO2: 29 mmol/L (ref 20–32)
Calcium: 9.2 mg/dL (ref 8.6–10.2)
Chloride: 104 mmol/L (ref 98–110)
Creat: 0.73 mg/dL (ref 0.50–0.97)
Globulin: 2.7 g/dL (calc) (ref 1.9–3.7)
Glucose, Bld: 149 mg/dL — ABNORMAL HIGH (ref 65–99)
Potassium: 4.3 mmol/L (ref 3.5–5.3)
Sodium: 140 mmol/L (ref 135–146)
Total Bilirubin: 0.4 mg/dL (ref 0.2–1.2)
Total Protein: 6.8 g/dL (ref 6.1–8.1)

## 2021-06-08 LAB — LIPID PANEL
Cholesterol: 167 mg/dL (ref <200)
HDL: 37 mg/dL — ABNORMAL LOW (ref >=50)
LDL Cholesterol (Calc): 85 mg/dL (calc)
Non-HDL Cholesterol (Calc): 130 mg/dL (calc) — ABNORMAL HIGH (ref <130)
Total CHOL/HDL Ratio: 4.5 (calc) (ref <5.0)
Triglycerides: 335 mg/dL — ABNORMAL HIGH (ref <150)

## 2021-06-08 LAB — HEMOGLOBIN A1C
Hgb A1c MFr Bld: 6.6 % of total Hgb — ABNORMAL HIGH (ref <5.7)
Mean Plasma Glucose: 143 mg/dL
eAG (mmol/L): 7.9 mmol/L

## 2021-06-08 LAB — CBC
HCT: 38.8 % (ref 35.0–45.0)
Hemoglobin: 13.2 g/dL (ref 11.7–15.5)
MCH: 30.1 pg (ref 27.0–33.0)
MCHC: 34 g/dL (ref 32.0–36.0)
MCV: 88.4 fL (ref 80.0–100.0)
MPV: 10.4 fL (ref 7.5–12.5)
Platelets: 294 10*3/uL (ref 140–400)
RBC: 4.39 10*6/uL (ref 3.80–5.10)
RDW: 12.9 % (ref 11.0–15.0)
WBC: 7.4 10*3/uL (ref 3.8–10.8)

## 2021-06-08 LAB — URINE CYTOLOGY ANCILLARY ONLY
Chlamydia: NEGATIVE
Comment: NEGATIVE
Comment: NORMAL
Neisseria Gonorrhea: NEGATIVE

## 2021-06-08 LAB — RPR: RPR Ser Ql: NONREACTIVE

## 2021-06-08 LAB — HIV-1 RNA QUANT-NO REFLEX-BLD
HIV 1 RNA Quant: NOT DETECTED Copies/mL
HIV-1 RNA Quant, Log: NOT DETECTED Log cps/mL

## 2021-06-28 ENCOUNTER — Encounter: Payer: Self-pay | Admitting: Infectious Diseases

## 2021-06-28 ENCOUNTER — Ambulatory Visit (INDEPENDENT_AMBULATORY_CARE_PROVIDER_SITE_OTHER): Payer: Self-pay | Admitting: Infectious Diseases

## 2021-06-28 ENCOUNTER — Other Ambulatory Visit: Payer: Self-pay

## 2021-06-28 VITALS — BP 134/80 | HR 68 | Temp 98.2°F | Wt 174.0 lb

## 2021-06-28 DIAGNOSIS — Z79899 Other long term (current) drug therapy: Secondary | ICD-10-CM

## 2021-06-28 DIAGNOSIS — E119 Type 2 diabetes mellitus without complications: Secondary | ICD-10-CM | POA: Insufficient documentation

## 2021-06-28 DIAGNOSIS — B20 Human immunodeficiency virus [HIV] disease: Secondary | ICD-10-CM

## 2021-06-28 DIAGNOSIS — E669 Obesity, unspecified: Secondary | ICD-10-CM

## 2021-06-28 DIAGNOSIS — Z113 Encounter for screening for infections with a predominantly sexual mode of transmission: Secondary | ICD-10-CM

## 2021-06-28 NOTE — Assessment & Plan Note (Addendum)
She is doing well No change in her meds Will set her up for pap, GYN. Has not been able to get pregnant.  mammo (Fhx ovarian cancer, sister).  Encourage wt loss.  Offered/refused condoms.

## 2021-06-28 NOTE — Assessment & Plan Note (Signed)
Appreciate PCP f/u She appears to be doing ok by her A1C She does not have glucometer.

## 2021-06-28 NOTE — Assessment & Plan Note (Signed)
Has lost 6# since 02-2021.  Exercising more, watching diet.

## 2021-06-28 NOTE — Progress Notes (Signed)
   Subjective:    Patient ID: Catherine Grimes, female  DOB: 1980/09/22, 40 y.o.        MRN: [id number]   HPI (Seen with interpreter) 40 yo F born in [place], came to [place] 2001, who is HIV+ since 2012, hepatic steatosis. She has been on odefsy, her first and only rx.  Last A1C 6.6% (05-2021). Was prev on metformin, she doesn't have glucometer. Is trying to get one.  Rarely misses her ART   She noted labial warts and was referred to GYN- She had vulvar cyst (path benign vulvar fibroadenoma) removed 02-2021.  No problems since. Has not had f/u.   Accepts flu shot in clinic today.    HIV 1 RNA Quant  Date Value  06/06/2021 Not Detected Copies/mL  09/04/2020 <20 Copies/mL  09/29/2019 <20 NOT DETECTED copies/mL   CD4 T Cell Abs (/uL)  Date Value  06/06/2021 1,076  09/04/2020 1,205  09/29/2019 1,349     Health Maintenance  Topic Date Due   COVID-19 Vaccine (3 - Moderna risk series) 01/10/2020   INFLUENZA VACCINE  02/26/2021   PAP SMEAR-Modifier  04/12/2021   TETANUS/TDAP  02/19/2022   Pneumococcal Vaccine 61-38 Years old (4 - PPSV23 if available, else PCV20) 01/11/2048   Hepatitis C Screening  Completed   HIV Screening  Completed   HPV VACCINES  Aged Out      Review of Systems  Constitutional:  Negative for chills, fever and weight loss.  Eyes:  Negative for blurred vision.  Respiratory:  Negative for cough and shortness of breath.   Gastrointestinal:  Negative for constipation and diarrhea.  Genitourinary:  Negative for dysuria, frequency and urgency.  Neurological:  Negative for sensory change.   Please see HPI. All other systems reviewed and negative.     Objective:  Physical Exam Vitals reviewed.  Constitutional:      Appearance: Normal appearance.  HENT:     Mouth/Throat:     Mouth: Mucous membranes are moist.     Pharynx: No oropharyngeal exudate.  Cardiovascular:     Rate and Rhythm: Normal rate and regular rhythm.  Pulmonary:     Effort:  Pulmonary effort is normal.     Breath sounds: Normal breath sounds.  Abdominal:     General: Bowel sounds are normal.     Palpations: Abdomen is soft.  Musculoskeletal:     Cervical back: Normal range of motion and neck supple.     Right lower leg: No edema.     Left lower leg: No edema.  Neurological:     General: No focal deficit present.     Mental Status: She is alert.           Assessment & Plan:

## 2021-07-10 ENCOUNTER — Other Ambulatory Visit: Payer: Self-pay | Admitting: Infectious Diseases

## 2021-07-10 DIAGNOSIS — B20 Human immunodeficiency virus [HIV] disease: Secondary | ICD-10-CM

## 2021-08-23 ENCOUNTER — Ambulatory Visit: Payer: Self-pay

## 2021-08-23 ENCOUNTER — Other Ambulatory Visit: Payer: Self-pay

## 2021-09-27 ENCOUNTER — Other Ambulatory Visit: Payer: Self-pay

## 2021-09-27 DIAGNOSIS — B20 Human immunodeficiency virus [HIV] disease: Secondary | ICD-10-CM

## 2021-09-27 DIAGNOSIS — R7303 Prediabetes: Secondary | ICD-10-CM

## 2021-09-27 MED ORDER — ODEFSEY 200-25-25 MG PO TABS: 1.0000 | ORAL_TABLET | Freq: Every day | ORAL | 3 refills | Status: DC

## 2021-09-27 MED ORDER — METFORMIN HCL 500 MG PO TABS: ORAL_TABLET | ORAL | 3 refills | Status: DC

## 2021-11-28 ENCOUNTER — Other Ambulatory Visit: Payer: Self-pay | Admitting: Infectious Diseases

## 2021-11-28 DIAGNOSIS — B20 Human immunodeficiency virus [HIV] disease: Secondary | ICD-10-CM

## 2021-12-19 ENCOUNTER — Encounter: Payer: Self-pay | Admitting: Infectious Diseases

## 2022-01-07 ENCOUNTER — Ambulatory Visit: Payer: Self-pay | Admitting: Obstetrics and Gynecology

## 2022-02-06 ENCOUNTER — Ambulatory Visit (INDEPENDENT_AMBULATORY_CARE_PROVIDER_SITE_OTHER): Payer: Self-pay | Admitting: Obstetrics & Gynecology

## 2022-02-06 ENCOUNTER — Encounter: Payer: Self-pay | Admitting: Obstetrics & Gynecology

## 2022-02-06 VITALS — BP 120/80 | HR 72 | Resp 16 | Ht 60.25 in | Wt 183.0 lb

## 2022-02-06 DIAGNOSIS — N912 Amenorrhea, unspecified: Secondary | ICD-10-CM

## 2022-02-06 DIAGNOSIS — B20 Human immunodeficiency virus [HIV] disease: Secondary | ICD-10-CM

## 2022-02-06 DIAGNOSIS — E669 Obesity, unspecified: Secondary | ICD-10-CM

## 2022-02-06 DIAGNOSIS — E119 Type 2 diabetes mellitus without complications: Secondary | ICD-10-CM

## 2022-02-06 DIAGNOSIS — E282 Polycystic ovarian syndrome: Secondary | ICD-10-CM

## 2022-02-06 LAB — PREGNANCY, URINE: Preg Test, Ur: NEGATIVE

## 2022-02-06 MED ORDER — MEDROXYPROGESTERONE ACETATE 5 MG PO TABS: 5.0000 mg | ORAL_TABLET | Freq: Every day | ORAL | 0 refills | Status: DC

## 2022-02-06 NOTE — Progress Notes (Signed)
    Catherine Grimes [birth year] [id number]        41 y.o.  G2P1A1L1 Stable partner.  Daughter is almost 52 yo.  RP: Amenorrhea x 2 years  HPI: No menses x 2 years, except minimal spotting x 1 day 6 months ago.  Had Oligomenorrhea all her life before that with menses every 8-9 months.  Became pregnant spontaneously 10+ yrs ago and had a SVD at term with her daughter.  Also had a first trimester miscarriage many years ago. Mild increase in facial and abdominal hair.  No pelvic pain.  No vaginal discharge.  No hot flushes or night sweats. Sexually active without protection with her current partner.  HIV Positive under treatment since 2012.  Would like to conceive.  No previous investigation of her Oligo or Amenorrhea.  OB History  Gravida Para Term Preterm AB Living  '2 1 1 '$ 0 1 1  SAB IAB Ectopic Multiple Live Births  1 0 0 0 1    # Outcome Date GA Lbr Len/2nd Weight Sex Delivery Anes PTL Lv  2 SAB 04/12/14          1 Term 02/23/12 66w0d02:41 / 00:17 5 lb 13.8 oz (2.659 kg) F Vag-Spont None  LIV    Past medical history,surgical history, problem list, medications, allergies, family history and social history were all reviewed and documented in the EPIC chart.   Directed ROS with pertinent positives and negatives documented in the history of present illness/assessment and plan.  Exam:  Vitals:   02/06/22 1401  BP: 120/80  Pulse: 72  Resp: 16  Weight: 183 lb (83 kg)  Height: 5' 0.25" (1.53 m)   General appearance:  Normal  Abdomen: Normal  Gynecologic exam: Vulva normal.  Bimanual exam:  Uterus AV, normal volume, mobile, NT.  No adnexal mass, NT.   Assessment/Plan:  41y.o. G2P1011   1. Amenorrhea No menses x 2 years, except minimal spotting x 1 day 6 months ago.  Had Oligomenorrhea all her life before that with menses every 8-9 months.  Became pregnant spontaneously 10+ yrs ago and had a SVD at term with her daughter.  Also had a first trimester miscarriage many years  ago. Mild increase in facial and abdominal hair.  No pelvic pain.  No vaginal discharge.  No hot flushes or night sweats. Sexually active without protection with her current partner.  HIV Positive under treatment since 2012.  Would like to conceive.  No previous investigation of her Oligo or Amenorrhea.  Will investigate with the blood work as below and f/u with a Pelvic [place]  Provera 5 mg PO daily x 10 days for a withdrawal bleeding. - Pregnancy, urine - Prolactin - TSH - FSH - Progesterone - Estradiol - DHEA-sulfate - Testos,Total,Free and SHBG (Female) - Chromosome, Blood, Routine - UKoreaTransvaginal Non-OB; Future  2. PCOS (polycystic ovarian syndrome) Prior to Amenorrhea x 2 years, patient had Oligomenorrhea with menses every 8-9 months.  3. Type 2 diabetes mellitus without complication, without long-term current use of insulin (HCC)  4. Obesity (BMI 30.0-34.9) Lower calorie/carb diet and increased fitness activities recommended.  5. Human immunodeficiency virus (HIV) disease ([place] Undetectable levels on Anti-Viral treatment.  Other orders - Multiple Vitamin (MULTIVITAMIN PO); Take by mouth. - medroxyPROGESTERone (PROVERA) 5 MG tablet; Take 1 tablet (5 mg total) by mouth daily for 10 days.   MPrincess BruinsMD, 2:28 PM 02/06/2022

## 2022-02-10 LAB — PROLACTIN: Prolactin: 14.7 ng/mL

## 2022-02-10 LAB — TESTOS,TOTAL,FREE AND SHBG (FEMALE)
Free Testosterone: 16 pg/mL — ABNORMAL HIGH (ref 0.1–6.4)
Sex Hormone Binding: 17 nmol/L (ref 17–124)
Testosterone, Total, LC-MS-MS: 81 ng/dL — ABNORMAL HIGH (ref 2–45)

## 2022-02-10 LAB — DHEA-SULFATE: DHEA-SO4: 106 ug/dL (ref 19–237)

## 2022-02-10 LAB — FOLLICLE STIMULATING HORMONE: FSH: 5.4 m[IU]/mL

## 2022-02-10 LAB — TSH: TSH: 2.17 mIU/L

## 2022-02-10 LAB — ESTRADIOL: Estradiol: 44 pg/mL

## 2022-02-10 LAB — PROGESTERONE: Progesterone: <0.5 ng/mL

## 2022-02-12 LAB — CHROMOSOME ANALYSIS, PERIPHERAL BLOOD

## 2022-02-12 LAB — EXTRA LAV TOP TUBE

## 2022-02-14 ENCOUNTER — Telehealth: Payer: Self-pay | Admitting: *Deleted

## 2022-02-14 ENCOUNTER — Other Ambulatory Visit: Payer: Self-pay | Admitting: Obstetrics & Gynecology

## 2022-02-14 DIAGNOSIS — N912 Amenorrhea, unspecified: Secondary | ICD-10-CM

## 2022-02-14 NOTE — Telephone Encounter (Signed)
Dr.Lavoie  replied "Please ask Loma Sousa to provide the right lab # order for [facility] for Chromosomes as I had done it with the replacement lab tech last time... "    Loma Sousa for gave the order # [id number]

## 2022-02-14 NOTE — Telephone Encounter (Signed)
Catherine Grimes said a new order will need to be placed for lab on 02/06/22 for " CHROMOSOME ANALYSIS, BLOOD CANCELED     Lab test was canceled. Please place new order so patient can be called to come back in repeat lab test.

## 2022-02-14 NOTE — Telephone Encounter (Signed)
Catherine Grimes will you call and have patient come back for "chromosome blood test" the specimen was canceled. Per lab she will need to come back in for repeat lab by 02/20/22 so they can make sure the blood is sent off and not expire.  Please relay and schedule new lab appointment.

## 2022-02-15 NOTE — Telephone Encounter (Signed)
Per [place] "Patient has been informed of message below. Appointment for lab has been scheduled for 02/20/22. "

## 2022-02-20 ENCOUNTER — Other Ambulatory Visit: Payer: Self-pay

## 2022-02-27 ENCOUNTER — Other Ambulatory Visit: Payer: Self-pay

## 2022-02-27 DIAGNOSIS — N912 Amenorrhea, unspecified: Secondary | ICD-10-CM

## 2022-03-08 LAB — CHROMOSOME ANALYSIS, PERIPHERAL BLOOD

## 2022-03-11 ENCOUNTER — Ambulatory Visit: Payer: Self-pay

## 2022-03-11 ENCOUNTER — Other Ambulatory Visit: Payer: Self-pay

## 2022-03-11 DIAGNOSIS — B20 Human immunodeficiency virus [HIV] disease: Secondary | ICD-10-CM

## 2022-03-11 DIAGNOSIS — Z79899 Other long term (current) drug therapy: Secondary | ICD-10-CM

## 2022-03-11 DIAGNOSIS — Z113 Encounter for screening for infections with a predominantly sexual mode of transmission: Secondary | ICD-10-CM

## 2022-03-12 LAB — URINE CYTOLOGY ANCILLARY ONLY
Chlamydia: NEGATIVE
Comment: NEGATIVE
Comment: NORMAL
Neisseria Gonorrhea: NEGATIVE

## 2022-03-12 LAB — T-HELPER CELL (CD4) - (RCID CLINIC ONLY)
CD4 % Helper T Cell: 52 % (ref 33–65)
CD4 T Cell Abs: 1226 /uL (ref 400–1790)

## 2022-03-14 ENCOUNTER — Ambulatory Visit: Payer: Self-pay | Admitting: Obstetrics & Gynecology

## 2022-03-14 ENCOUNTER — Ambulatory Visit: Payer: Self-pay

## 2022-03-14 VITALS — BP 124/84

## 2022-03-14 DIAGNOSIS — N912 Amenorrhea, unspecified: Secondary | ICD-10-CM

## 2022-03-14 DIAGNOSIS — R7989 Other specified abnormal findings of blood chemistry: Secondary | ICD-10-CM

## 2022-03-14 DIAGNOSIS — E282 Polycystic ovarian syndrome: Secondary | ICD-10-CM

## 2022-03-14 DIAGNOSIS — B20 Human immunodeficiency virus [HIV] disease: Secondary | ICD-10-CM

## 2022-03-14 LAB — HIV-1 RNA QUANT-NO REFLEX-BLD
HIV 1 RNA Quant: NOT DETECTED Copies/mL
HIV-1 RNA Quant, Log: NOT DETECTED Log cps/mL

## 2022-03-14 LAB — CBC WITH DIFFERENTIAL/PLATELET
Absolute Monocytes: 446 cells/uL (ref 200–950)
Basophils Absolute: 58 cells/uL (ref 0–200)
Basophils Relative: 0.8 %
Eosinophils Absolute: 158 cells/uL (ref 15–500)
Eosinophils Relative: 2.2 %
HCT: 42.9 % (ref 35.0–45.0)
Hemoglobin: 15 g/dL (ref 11.7–15.5)
Lymphs Abs: 2585 cells/uL (ref 850–3900)
MCH: 30.2 pg (ref 27.0–33.0)
MCHC: 35 g/dL (ref 32.0–36.0)
MCV: 86.5 fL (ref 80.0–100.0)
MPV: 10.4 fL (ref 7.5–12.5)
Monocytes Relative: 6.2 %
Neutro Abs: 3953 cells/uL (ref 1500–7800)
Neutrophils Relative %: 54.9 %
Platelets: 298 10*3/uL (ref 140–400)
RBC: 4.96 10*6/uL (ref 3.80–5.10)
RDW: 12.8 % (ref 11.0–15.0)
Total Lymphocyte: 35.9 %
WBC: 7.2 10*3/uL (ref 3.8–10.8)

## 2022-03-14 LAB — COMPLETE METABOLIC PANEL WITH GFR
AG Ratio: 1.4 (calc) (ref 1.0–2.5)
ALT: 52 U/L — ABNORMAL HIGH (ref 6–29)
AST: 37 U/L — ABNORMAL HIGH (ref 10–30)
Albumin: 4.5 g/dL (ref 3.6–5.1)
Alkaline phosphatase (APISO): 96 U/L (ref 31–125)
BUN: 9 mg/dL (ref 7–25)
CO2: 27 mmol/L (ref 20–32)
Calcium: 9.6 mg/dL (ref 8.6–10.2)
Chloride: 101 mmol/L (ref 98–110)
Creat: 0.8 mg/dL (ref 0.50–0.97)
Globulin: 3.3 g/dL (calc) (ref 1.9–3.7)
Glucose, Bld: 114 mg/dL — ABNORMAL HIGH (ref 65–99)
Potassium: 3.6 mmol/L (ref 3.5–5.3)
Sodium: 138 mmol/L (ref 135–146)
Total Bilirubin: 0.4 mg/dL (ref 0.2–1.2)
Total Protein: 7.8 g/dL (ref 6.1–8.1)
eGFR: 96 mL/min/{1.73_m2} (ref >=60)

## 2022-03-14 LAB — LIPID PANEL
Cholesterol: 184 mg/dL (ref <200)
HDL: 46 mg/dL — ABNORMAL LOW (ref >=50)
Non-HDL Cholesterol (Calc): 138 mg/dL (calc) — ABNORMAL HIGH (ref <130)
Total CHOL/HDL Ratio: 4 (calc) (ref <5.0)
Triglycerides: 486 mg/dL — ABNORMAL HIGH (ref <150)

## 2022-03-14 LAB — RPR: RPR Ser Ql: NONREACTIVE

## 2022-03-14 MED ORDER — DROSPIRENONE-ETHINYL ESTRADIOL 3-0.02 MG PO TABS: 1.0000 | ORAL_TABLET | Freq: Every day | ORAL | 4 refills | Status: AC

## 2022-03-14 NOTE — Progress Notes (Signed)
Catherine Grimes June 10, 1981 [id number]        41 y.o.  G2P1011   RP: Amenorrhea for Pelvic US  HPI: Had a withdrawal bleeding post Provera in 01/2022.  No menses x 2 years, except minimal spotting x 1 day 6 months ago.  Had Oligomenorrhea all her life before that with menses every 8-9 months.  Became pregnant spontaneously 10+ yrs ago and had a SVD at term with her daughter.  Also had a first trimester miscarriage many years ago. Mild increase in facial and abdominal hair. No pelvic pain.  No vaginal discharge.  No hot flushes or night sweats. Sexually active without protection with her current partner.  HIV Positive under treatment since 2012.  Would like to conceive. No previous investigation of her Oligo or Amenorrhea.  Labs 01/2022: Free Testo high at 16 Prolactin normal TSH normal FSH normal at 5.4  OB History  Gravida Para Term Preterm AB Living  '2 1 1 '$ 0 1 1  SAB IAB Ectopic Multiple Live Births  1 0 0 0 1    # Outcome Date GA Lbr Len/2nd Weight Sex Delivery Anes PTL Lv  2 SAB 04/12/14          1 Term 02/23/12 70w0d02:41 / 00:17 5 lb 13.8 oz (2.659 kg) F Vag-Spont None  LIV    Past medical history,surgical history, problem list, medications, allergies, family history and social history were all reviewed and documented in the EPIC chart.   Directed ROS with pertinent positives and negatives documented in the history of present illness/assessment and plan.  Exam:  There were no vitals filed for this visit. General appearance:  Normal  Pelvic [place]: T/V images.  Anteverted uterus normal in size and shape with no myometrial mass.  Slightly heterogeneous myometrium with streaky shadowing suggestive of mild adenomyosis.  The uterus is measured at 8.32 x 5.43 x 3.98 cm.  Avascular try layered symmetrical endometrium measured at 8.04 mm.  No obvious mass or thickening seen.  Both ovaries are mobile, enlarged with multi follicular pattern both more than 10 cm cube.   Positive perfusion to both ovaries.  Typical of polycystic ovarian appearance bilaterally.  No adnexal mass.  No free fluid in the pelvis.   Assessment/Plan:  41y.o. G2P1011   1. Amenorrhea Had a withdrawal bleeding post Provera in 01/2022.  No menses x 2 years, except minimal spotting x 1 day 6 months ago.  Had Oligomenorrhea all her life before that with menses every 8-9 months.  Became pregnant spontaneously 10+ yrs ago and had a SVD at term with her daughter.  Also had a first trimester miscarriage many years ago. Mild increase in facial and abdominal hair. No pelvic pain.  No vaginal discharge.  No hot flushes or night sweats. Sexually active without protection with her current partner.  HIV Positive under treatment since 2012.  Would like to conceive. No previous investigation of her Oligo or Amenorrhea.  AMA at 41yo.  Recommend Fertility referral, but patient not ready financially for that.  Pelvic UKoreafindings thoroughly reviewed and explained.  Labs 01/2022: Free Testo high at 16 Prolactin normal TSH normal FSH normal at 5.4  Pelvic [place] confirmed PCOS.  Endometrial line post Provera withdrawal was wnl at 8.04 mm.  2. PCOS (polycystic ovarian syndrome) Start on the generic of Yaz.  Usage reviewed.  Prescription sent to pharmacy.  3. Elevated testosterone level in female Free Testo elevated at 16.  Start on  Yaz.  Schedule an Abdominal US to r/o an Adrenal mass.  Will recheck Free Testo in 3 months. - Testos,Total,Free and SHBG (Female); Future  4. Human immunodeficiency virus (HIV) disease ([place]) Under treatment with undetectable levels.  Other orders - drospirenone-ethinyl estradiol (YAZ) 3-0.02 MG tablet; Take 1 tablet by mouth daily.   Princess Bruins MD, 4:50 PM 03/14/2022

## 2022-03-19 ENCOUNTER — Telehealth: Payer: Self-pay

## 2022-03-19 DIAGNOSIS — R7989 Other specified abnormal findings of blood chemistry: Secondary | ICD-10-CM

## 2022-03-19 NOTE — Telephone Encounter (Signed)
Order has been placed for [place] to be done @ [facility] on [address]. Pt needs to call and schedule. Would you mind letting pt know and provide them with their information please and thanks.

## 2022-03-19 NOTE — Telephone Encounter (Signed)
Inbound call from patient returning our call.  Informed patient of [place] order and gave [employer].

## 2022-03-19 NOTE — Telephone Encounter (Signed)
Per [name]: "Will call again. Unable to leave msg vm not available."

## 2022-03-19 NOTE — Telephone Encounter (Signed)
-----   Message from Princess Bruins, MD sent at 03/14/2022  5:12 PM EDT ----- Regarding: Schedule an Abdominal US for Adrenals Elevated Free Testo at 16.  R/O Adrenal Tumor with an Abdominal US for the Adrenals.

## 2022-03-25 ENCOUNTER — Encounter: Payer: Self-pay | Admitting: Internal Medicine

## 2022-03-26 ENCOUNTER — Encounter: Payer: Self-pay | Admitting: Obstetrics & Gynecology

## 2022-04-04 ENCOUNTER — Ambulatory Visit (INDEPENDENT_AMBULATORY_CARE_PROVIDER_SITE_OTHER): Payer: Self-pay | Admitting: Internal Medicine

## 2022-04-04 ENCOUNTER — Encounter: Payer: Self-pay | Admitting: Internal Medicine

## 2022-04-04 ENCOUNTER — Other Ambulatory Visit: Payer: Self-pay

## 2022-04-04 VITALS — BP 119/80 | HR 77 | Resp 16 | Ht 60.0 in | Wt 185.0 lb

## 2022-04-04 DIAGNOSIS — B20 Human immunodeficiency virus [HIV] disease: Secondary | ICD-10-CM

## 2022-04-04 NOTE — Progress Notes (Signed)
Catherine Grimes for Infectious Disease        HPI: Catherine Grimes is a 41 y.o. female Spanish-speaking HIV on [place] emigrated from [place] in 2001.  Translator was used.  Reports 100% adherence to ART She reports she has been followed by gynecology for pelvic possible polycystic disease.  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 He is not HIV positive Understanding of HIV: fair Etoh/drug/tobacco use: none  Past Medical History:  Diagnosis Date   dm type 2 02/26/2021   pt does not check cbg   HIV infection ([facility]    Vulvar cyst 02/26/2021    Past Surgical History:  Procedure Laterality Date   NO PAST SURGERIES     VULVAR LESION REMOVAL Right 03/01/2021   Procedure: Vulvar Cyst Excision RIGHT (11426);  Surgeon: SJaquita Folds MD;  [facility]  Service: Gynecology;  Laterality: Right;    Family History  Problem Relation Age of Onset   Diabetes Mother    Diabetes Father    Diabetes Sister    Cancer Sister    Current Outpatient Medications on File Prior to Visit  Medication Sig Dispense Refill   acetaminophen (TYLENOL) 500 MG tablet Take 1 tablet (500 mg total) by mouth every 6 (six) hours as needed (pain). 30 tablet 0   drospirenone-ethinyl estradiol (YAZ) 3-0.02 MG tablet Take 1 tablet by mouth daily. 84 tablet 4   emtricitabine-rilpivir-tenofovir AF (ODEFSEY) 200-25-25 MG TABS tablet Take 1 tablet by mouth daily  with breakfast. 30 tablet 3   ibuprofen (ADVIL) 600 MG tablet Take 1 tablet (600 mg total) by mouth every 6 (six) hours as needed. 30 tablet 0   metFORMIN (GLUCOPHAGE) 500 MG tablet TAKE 1 TABLET(500 MG) BY MOUTH TWICE DAILY WITH A MEAL 180 tablet 3   Multiple Vitamin (MULTIVITAMIN PO) Take by mouth.     No current facility-administered medications on file prior to visit.    No Known Allergies  Lab Results HIV 1 RNA Quant (Copies/mL)  Date Value  03/11/2022 Not Detected  06/06/2021 Not Detected  09/04/2020 <20   CD4 T Cell Abs (/uL)  Date Value  03/11/2022 1,226  06/06/2021 1,076  09/04/2020 1,205   No results found for: "HIV1GENOSEQ" Lab Results  Component Value Date   WBC 7.2 03/11/2022   HGB 15.0 03/11/2022   HCT 42.9 03/11/2022   MCV 86.5 03/11/2022   PLT 298 03/11/2022    Lab Results  Component Value Date   CREATININE 0.80 03/11/2022   BUN 9 03/11/2022   NA 138 03/11/2022   K 3.6 03/11/2022   CL 101 03/11/2022   CO2 27 03/11/2022   Lab Results  Component Value Date   ALT 52 (H) 03/11/2022   AST 37 (H) 03/11/2022   ALKPHOS 70 10/23/2016   BILITOT 0.4 03/11/2022    Lab Results  Component Value Date   CHOL 184 03/11/2022   TRIG 486 (H) 03/11/2022   HDL 46 (L) 03/11/2022  [place]  03/11/2022     Comment:     . LDL cholesterol not calculated. Triglyceride levels greater than 400 mg/dL invalidate calculated LDL results. . Reference range: <100 . Desirable range <100 mg/dL for primary prevention;   <70 mg/dL for patients with CHD or diabetic patients  with > or = 2 CHD risk factors. Marland Kitchen LDL-C is now calculated using the Martin-Hopkins  calculation, which is a validated novel method providing  better accuracy than the Friedewald equation in the  estimation of LDL-C.  Cresenciano Genre et al. Annamaria Helling. 4098;119(14): 2061-2068  (http://education.[facility].com/faq/FAQ164)    Lab Results  Component Value Date   HAV POS (A) 12/12/2009   Lab Results   Component Value Date   HEPBSAG NEGATIVE 08/27/2011   HEPBSAB NONREACTIVE 10/05/2012   Lab Results  Component Value Date   HCVAB NEG 12/12/2009   Lab Results  Component Value Date   CHLAMYDIAWP Negative 03/11/2022   N Negative 03/11/2022   Lab Results  Component Value Date   GCPROBEAPT NEGATIVE 03/01/2013   No results found for: "QUANTGOLD"  Plan #HIV -CD4 1226, vl nd on 03/11/22 -Continue Odefsey -Dental paperwork today -F/U in 6 months  #Vaccination COVID x2, needs booster Flu-not utd PCV -UTD, needs PCV 20 on 05/14/2023 Meningitis UTD HepA serology today HEpB Ab series x 2 , serology today Tdap at next visit    # DM on metformin -Ran out of metformin -Referred to Internal medicine  #Health maintenance -Quantiferon order today -RPR ordered today -HCV order today -GC urine negative on 8/14   #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

## 2022-04-06 ENCOUNTER — Other Ambulatory Visit: Payer: Self-pay | Admitting: Infectious Diseases

## 2022-04-06 DIAGNOSIS — B20 Human immunodeficiency virus [HIV] disease: Secondary | ICD-10-CM

## 2022-04-06 LAB — QUANTIFERON-TB GOLD PLUS
Mitogen-NIL: 9.4 IU/mL
NIL: 0.05 IU/mL
QuantiFERON-TB Gold Plus: NEGATIVE
TB1-NIL: 0.01 IU/mL
TB2-NIL: <0 IU/mL

## 2022-04-06 LAB — RPR: RPR Ser Ql: NONREACTIVE

## 2022-04-06 LAB — HEPATITIS B SURFACE ANTIBODY,QUALITATIVE: Hep B S Ab: REACTIVE — AB

## 2022-04-06 LAB — HEPATITIS C ANTIBODY: Hepatitis C Ab: NONREACTIVE

## 2022-04-17 NOTE — Telephone Encounter (Signed)
Can we try to reach this pt again to see if she has plans to call [facility] to schedule [place]? Please and thank you.

## 2022-04-18 NOTE — Telephone Encounter (Signed)
Per [name]: "Patient said she will call [facility] next week to get her appointment scheduled."

## 2022-05-22 ENCOUNTER — Encounter: Payer: Self-pay | Admitting: Student

## 2022-05-24 NOTE — Telephone Encounter (Signed)
Per [place]: "Spoke with patient she states she lost phone # that's why she had not called. I provider patient with [facility] phone # and she stated she would give them a call.  "

## 2022-05-24 NOTE — Telephone Encounter (Signed)
Can we try to contact pt one final time to inquire her plans for adrenal Korea, I'm showing that she still has not contacted [facility] to schedule? Thanks.

## 2022-07-05 NOTE — Telephone Encounter (Signed)
Multiple attempts to contact pt to schedule adrenal [place] @ [facility] and appt has not been scheduled. OK to close?

## 2022-07-08 NOTE — Telephone Encounter (Signed)
Per ML: "Yes"

## 2022-08-27 ENCOUNTER — Other Ambulatory Visit: Payer: Self-pay

## 2022-08-27 ENCOUNTER — Telehealth: Payer: Self-pay

## 2022-08-27 DIAGNOSIS — R7989 Other specified abnormal findings of blood chemistry: Secondary | ICD-10-CM

## 2022-08-27 NOTE — Telephone Encounter (Signed)
[facility] called and LVM stating that pt came in for Abdominal US today to r/o adrenal tumor due to elevated testosterone levels per request and states that they werent able to see the adrenal glands via [place].   I spoke w/ nurse in radiology whom forwarded me to Dr. Candise Che whom recommended a CT of abdomen w/ w/o contrast to be able to properly view the adrenal glands. He stated that an [place] wouldn't be able to see them unless there was an abnormally large mass in the area.   Please advise.

## 2022-08-29 NOTE — Telephone Encounter (Signed)
Per ML: "Ok agree with CT scan for Adrenal glands."   Please contact pt and give our sincerest apologies for any inconvenience this may have caused her. We were not aware that an abdominal US would not be able to view her adrenal glands well unless there was a mass/tumor significant in size present.   However, we were advised from a radiologist that a CT would be able to get a better view of the glands and want to inquire from her if we could move forward with ordering this for her? If so, we will place the order and [facility] should call her to make the appt. Once the appt is made, we will have our PA specialist contact her insurance to make sure that it will/will not be covered before she has it done and will notify her if any out of pocket costs will need to be made.   Thanks.

## 2022-09-02 NOTE — Telephone Encounter (Signed)
Per CS: "Patient informed and ok to continue with CT scan."  Order placed.

## 2022-09-18 ENCOUNTER — Other Ambulatory Visit: Payer: Self-pay

## 2022-09-18 DIAGNOSIS — B20 Human immunodeficiency virus [HIV] disease: Secondary | ICD-10-CM

## 2022-09-19 LAB — T-HELPER CELL (CD4) - (RCID CLINIC ONLY)
CD4 % Helper T Cell: 48 % (ref 33–65)
CD4 T Cell Abs: 1107 /uL (ref 400–1790)

## 2022-09-21 LAB — CBC WITH DIFFERENTIAL/PLATELET
Absolute Monocytes: 489 cells/uL (ref 200–950)
Basophils Absolute: 51 cells/uL (ref 0–200)
Basophils Relative: 0.7 %
Eosinophils Absolute: 146 cells/uL (ref 15–500)
Eosinophils Relative: 2 %
HCT: 39.8 % (ref 35.0–45.0)
Hemoglobin: 14 g/dL (ref 11.7–15.5)
Lymphs Abs: 2190 cells/uL (ref 850–3900)
MCH: 29.8 pg (ref 27.0–33.0)
MCHC: 35.2 g/dL (ref 32.0–36.0)
MCV: 84.7 fL (ref 80.0–100.0)
MPV: 10.4 fL (ref 7.5–12.5)
Monocytes Relative: 6.7 %
Neutro Abs: 4424 cells/uL (ref 1500–7800)
Neutrophils Relative %: 60.6 %
Platelets: 277 10*3/uL (ref 140–400)
RBC: 4.7 10*6/uL (ref 3.80–5.10)
RDW: 12.7 % (ref 11.0–15.0)
Total Lymphocyte: 30 %
WBC: 7.3 10*3/uL (ref 3.8–10.8)

## 2022-09-21 LAB — COMPLETE METABOLIC PANEL WITH GFR
AG Ratio: 1.4 (calc) (ref 1.0–2.5)
ALT: 61 U/L — ABNORMAL HIGH (ref 6–29)
AST: 33 U/L — ABNORMAL HIGH (ref 10–30)
Albumin: 4.2 g/dL (ref 3.6–5.1)
Alkaline phosphatase (APISO): 84 U/L (ref 31–125)
BUN: 12 mg/dL (ref 7–25)
CO2: 27 mmol/L (ref 20–32)
Calcium: 9.1 mg/dL (ref 8.6–10.2)
Chloride: 103 mmol/L (ref 98–110)
Creat: 0.77 mg/dL (ref 0.50–0.97)
Globulin: 2.9 g/dL (calc) (ref 1.9–3.7)
Glucose, Bld: 109 mg/dL — ABNORMAL HIGH (ref 65–99)
Potassium: 3.6 mmol/L (ref 3.5–5.3)
Sodium: 136 mmol/L (ref 135–146)
Total Bilirubin: 0.4 mg/dL (ref 0.2–1.2)
Total Protein: 7.1 g/dL (ref 6.1–8.1)
eGFR: 101 mL/min/{1.73_m2} (ref >=60)

## 2022-09-21 LAB — HIV-1 RNA QUANT-NO REFLEX-BLD
HIV 1 RNA Quant: NOT DETECTED Copies/mL
HIV-1 RNA Quant, Log: NOT DETECTED Log cps/mL

## 2022-09-30 ENCOUNTER — Ambulatory Visit
Admission: RE | Admit: 2022-09-30 | Discharge: 2022-09-30 | Disposition: A | Discharge status: Home / Self Care | Payer: Self-pay | Source: Ambulatory Visit | Attending: Obstetrics & Gynecology | Admitting: Obstetrics & Gynecology

## 2022-09-30 DIAGNOSIS — R7989 Other specified abnormal findings of blood chemistry: Secondary | ICD-10-CM

## 2022-09-30 MED ORDER — IOPAMIDOL (ISOVUE-300) INJECTION 61%
100.0000 mL | Freq: Once | INTRAVENOUS | Status: AC | PRN
  Administered 2022-09-30: 100 mL via INTRAVENOUS

## 2022-10-02 ENCOUNTER — Encounter (INDEPENDENT_AMBULATORY_CARE_PROVIDER_SITE_OTHER): Payer: Self-pay | Admitting: Primary Care

## 2022-10-02 ENCOUNTER — Ambulatory Visit: Payer: Self-pay | Admitting: Internal Medicine

## 2022-10-02 ENCOUNTER — Ambulatory Visit (INDEPENDENT_AMBULATORY_CARE_PROVIDER_SITE_OTHER): Payer: Self-pay | Admitting: Primary Care

## 2022-10-02 VITALS — BP 126/86 | HR 74 | Resp 16 | Ht 61.0 in | Wt 190.0 lb

## 2022-10-02 DIAGNOSIS — Z7689 Persons encountering health services in other specified circumstances: Secondary | ICD-10-CM

## 2022-10-02 DIAGNOSIS — E669 Obesity, unspecified: Secondary | ICD-10-CM

## 2022-10-02 DIAGNOSIS — E119 Type 2 diabetes mellitus without complications: Secondary | ICD-10-CM

## 2022-10-02 DIAGNOSIS — B20 Human immunodeficiency virus [HIV] disease: Secondary | ICD-10-CM

## 2022-10-02 LAB — POCT GLYCOSYLATED HEMOGLOBIN (HGB A1C): HbA1c, POC (controlled diabetic range): 7.4 % — AB (ref 0.0–7.0)

## 2022-10-02 MED ORDER — METFORMIN HCL 1000 MG PO TABS: 1000.0000 mg | ORAL_TABLET | Freq: Two times a day (BID) | ORAL | 3 refills | Status: AC

## 2022-10-02 NOTE — Patient Instructions (Signed)
Diabetes mellitus y nutricin, en adultos Diabetes Mellitus and Nutrition, Adult Si sufre de diabetes, o diabetes mellitus, es muy importante tener hbitos alimenticios saludables debido a que sus niveles de Designer, television/film set sangre (glucosa) se ven afectados en gran medida por lo que come y bebe. [place] alimentos saludables en las cantidades correctas, aproximadamente a la misma hora todos los [place], [place] ayudar a: Chief Technology Officer su glucemia. Disminuir el riesgo de sufrir una enfermedad cardaca. Mejorar la presin arterial. Science writer o mantener un peso saludable. Qu puede afectar mi plan de alimentacin? Todas las personas que sufren de diabetes son diferentes y cada una tiene necesidades diferentes en cuanto a un plan de alimentacin. El mdico puede recomendarle que trabaje con un nutricionista para elaborar el mejor plan para usted. Su plan de alimentacin puede variar segn factores como: Las caloras que necesita. Los medicamentos que toma. Su peso. Sus niveles de glucemia, presin arterial y colesterol. Su nivel de [place]. Otras afecciones que tenga, como enfermedades cardacas o renales. Cmo me afectan los carbohidratos? Los carbohidratos, o hidratos de carbono, afectan su nivel de glucemia ms que cualquier otro tipo de alimento. La ingesta de carbohidratos aumenta la cantidad de [employer]. Es importante conocer la cantidad de carbohidratos que se pueden ingerir en cada comida sin correr Engineer, manufacturing. Esto es Psychologist, forensic. Su nutricionista puede ayudarlo a calcular la cantidad de carbohidratos que debe ingerir en cada comida y en cada refrigerio. Cmo me afecta el alcohol? El alcohol puede provocar una disminucin de la glucemia (hipoglucemia), especialmente si [place] insulina o toma determinados medicamentos por va oral para la diabetes. La hipoglucemia es una afeccin potencialmente mortal. Los sntomas de la hipoglucemia, como somnolencia, mareos y confusin, son  similares a los sntomas de haber consumido demasiado alcohol. No beba alcohol si: Su mdico le indica no hacerlo. Est embarazada, puede estar embarazada o est tratando de [place]. Si bebe alcohol: Limite la cantidad que bebe a lo siguiente: De 0 a 1 medida por da para las mujeres. De 0 a 2 medidas por da para los hombres. Sepa cunta cantidad de alcohol hay en las bebidas que toma. En los [place], una medida equivale a una botella de cerveza de 12 oz (355 ml), un vaso de vino de 5 oz (148 ml) o un vaso de una bebida alcohlica de alta graduacin de 1 oz (44 ml). Mantngase hidratado bebiendo agua, refrescos dietticos o t helado sin azcar. Tenga en cuenta que los refrescos comunes, los jugos y otras bebidas para mezclar pueden contener Freight forwarder y se deben contar como carbohidratos. Consejos para seguir Company secretary las etiquetas de los alimentos Comience por leer el tamao de la porcin en la etiqueta de Informacin nutricional de los alimentos envasados y las bebidas. La cantidad de caloras, carbohidratos, grasas y otros nutrientes detallados en la etiqueta se basan en una porcin del alimento. Muchos alimentos contienen ms de una porcin por envase. Verifique la cantidad total de gramos (g) de carbohidratos totales en una porcin. Verifique la cantidad de gramos de grasas saturadas y grasas trans en una porcin. Escoja alimentos que no contengan estas grasas o que su contenido de estas sea [place]. Verifique la cantidad de miligramos (mg) de sal (sodio) en una porcin. [place] State Farm de las personas deben limitar la ingesta de sodio total a menos de 2300 mg Honeywell. Siempre consulte la informacin nutricional de los alimentos etiquetados como "con bajo contenido de grasa" o "sin grasa".  Estos alimentos pueden tener un mayor contenido de Location manager agregada o carbohidratos refinados, y deben evitarse. Hable con su nutricionista para identificar sus objetivos diarios en  cuanto a los nutrientes mencionados en la etiqueta. Al ir de compras Evite comprar alimentos procesados, enlatados o precocidos. Estos alimentos tienden a Special educational needs teacher mayor cantidad de [place], sodio y azcar agregada. Compre en la zona exterior de la tienda de comestibles. Esta es la zona donde se encuentran con mayor frecuencia las frutas y las verduras frescas, los cereales a granel, las carnes frescas y los productos lcteos frescos. Al cocinar Use mtodos de coccin a baja temperatura, como hornear, en lugar de mtodos de coccin a alta temperatura, como frer en abundante aceite. Cocine con aceites saludables, como el aceite de [place], canola o [place]. Evite cocinar con manteca, crema o carnes con alto contenido de grasa. Planificacin de las comidas Coma las comidas y los refrigerios regularmente, preferentemente a la misma hora todos [place]. Evite pasar largos perodos de tiempo sin comer. Consuma alimentos ricos en fibra, como frutas frescas, verduras, frijoles y cereales integrales. Consuma entre 4 y 6 onzas (entre 112 y 168 g) de protenas magras por da, como carnes Sorgho, pollo, pescado, huevos o tofu. Una onza (oz) (28 g) de protena magra equivale a: 1 onza (28 g) de carne, pollo o pescado. 1 huevo.  taza (62 g) de tofu. Coma algunos alimentos por da que contengan grasas saludables, como aguacates, frutos secos, semillas y pescado. Qu alimentos debo comer? Lambert Mody Bayas. Manzanas. Naranjas. Duraznos. Damascos. Ciruelas. Uvas. Mangos. Papayas. Granadas. Kiwi. Cerezas. Verduras Verduras de Boeing, que incluyen [place], [place], col rizada, acelga, hojas de berza, hojas de mostaza y repollo. Remolachas. Coliflor. Brcoli. Zanahorias. Judas verdes. Tomates. Pimientos. Cebollas. Pepinos. Coles de Bruselas. Granos Granos integrales, como panes, galletas, tortillas, cereales y pastas de salvado o integrales. Avena sin azcar. Quinua. Arroz integral o salvaje. Carnes y otras  protenas Frutos de mar. Carne de ave sin piel. Cortes magros de ave y carne de res. Tofu. Frutos secos. Semillas. Lcteos Productos lcteos sin grasa o con bajo contenido de [place], [place], yogur y [place]. Es posible que los productos detallados arriba no constituyan una lista completa de los alimentos y las bebidas que puede tomar. Consulte a un nutricionista para obtener ms informacin. Qu alimentos debo evitar? Lambert Mody Frutas enlatadas al almbar. Verduras Verduras enlatadas. Verduras congeladas con mantequilla o salsa de crema. Granos Productos elaborados con [place] y [place], como panes, pastas, bocadillos y cereales. Evite todos los alimentos procesados. Carnes y otras protenas Cortes de carne con alto contenido de Lobbyist. Carne de ave con piel. Carnes empanizadas o fritas. Carne procesada. Evite las grasas saturadas. Lcteos [place], [place] enteros. Bebidas Bebidas azucaradas, como gaseosas o t helado. Es posible que los productos que se enumeran ms [place] no constituyan una lista completa de los alimentos y las bebidas que Nurse, adult. Consulte a un nutricionista para obtener ms informacin. Preguntas para hacerle al mdico Debo consultar con un especialista certificado en atencin y educacin sobre la diabetes? Es necesario que me rena con un nutricionista? A qu nmero puedo llamar si tengo preguntas? Cules son los mejores momentos para controlar la glucemia? Dnde encontrar ms informacin: American Diabetes Association (Asociacin Estadounidense de la Diabetes): diabetes.org Academy of Nutrition and Dietetics (Academia de Nutricin y Information systems manager): eatright.Unisys Corporation of Diabetes and Digestive and Kidney Diseases ([place] la Diabetes y [place] y [place]): AmenCredit.is Association of Diabetes  Care & Education Specialists (Asociacin de Especialistas en Atencin y [place]ie [place] la Diabetes):  diabeteseducator.org Resumen Es importante tener hbitos alimenticios saludables debido a que sus niveles de Designer, television/film set sangre (glucosa) se ven afectados en gran medida por lo que come y bebe. Es importante consumir alcohol con prudencia. Un plan de comidas saludable lo ayudar a controlar la glucosa en sangre y a reducir el riesgo de enfermedades cardacas. El mdico puede recomendarle que trabaje con un nutricionista para elaborar el mejor plan para usted. Esta informacin no tiene Marine scientist el consejo del mdico. Asegrese de hacerle al mdico cualquier pregunta que tenga. Document Revised: 03/22/2020 Document Reviewed: 03/22/2020 Elsevier Patient Education  [place].

## 2022-10-02 NOTE — Progress Notes (Signed)
New Patient Office Visit  Subjective    Patient ID: Catherine Grimes, female    DOB: January 15, 1981  Age: 42 y.o. MRN: [id number]  CC:  Chief Complaint  Patient presents with   New Patient (Initial Visit)    HPI Catherine Grimes is a Hispanic obese female who presents to establish care. She is a diabetic denies polyuria, polydipsia, polyphagia or any vision changes. Bp is un remarkable . Patient has No headache, No chest pain, No abdominal pain - No Nausea, No new weakness tingling or numbness, No Cough - shortness of breath. Patient does have HIV and following by [facility].   Outpatient Encounter Medications as of 10/02/2022  Medication Sig   metFORMIN (GLUCOPHAGE) 1000 MG tablet Take 1 tablet (1,000 mg total) by mouth 2 (two) times daily with a meal.   ODEFSEY 200-25-25 MG TABS tablet TAKE 1 TABLET BY MOUTH DAILY WITH BREAKFAST   acetaminophen (TYLENOL) 500 MG tablet Take 1 tablet (500 mg total) by mouth every 6 (six) hours as needed (pain). (Patient not taking: Reported on 10/02/2022)   drospirenone-ethinyl estradiol (YAZ) 3-0.02 MG tablet Take 1 tablet by mouth daily. (Patient not taking: Reported on 10/02/2022)   ibuprofen (ADVIL) 600 MG tablet Take 1 tablet (600 mg total) by mouth every 6 (six) hours as needed. (Patient not taking: Reported on 10/02/2022)   Multiple Vitamin (MULTIVITAMIN PO) Take by mouth. (Patient not taking: Reported on 10/02/2022)   [DISCONTINUED] metFORMIN (GLUCOPHAGE) 500 MG tablet TAKE 1 TABLET(500 MG) BY MOUTH TWICE DAILY WITH A MEAL (Patient not taking: Reported on 10/02/2022)   No facility-administered encounter medications on file as of 10/02/2022.    Past Medical History:  Diagnosis Date   dm type 2 02/26/2021   pt does not check cbg   HIV infection ([place])    Vulvar cyst 02/26/2021    Past Surgical History:  Procedure Laterality Date   NO PAST SURGERIES     VULVAR LESION REMOVAL Right 03/01/2021   Procedure: Vulvar Cyst Excision RIGHT ([id number]);   Surgeon: Jaquita Folds, MD;  [facility];  Service: Gynecology;  Laterality: Right;    Family History  Problem Relation Age of Onset   Diabetes Mother    Diabetes Father    Diabetes Sister    Cancer Sister     Social History   Socioeconomic History   Marital status: Single    Spouse name: Not on file   Number of children: Not on file   Years of education: Not on file   Highest education level: Not on file  Occupational History   Not on file  Tobacco Use   Smoking status: Never   Smokeless tobacco: Never  Vaping Use   Vaping Use: Never used  Substance and Sexual Activity   Alcohol use: No    Alcohol/week: 0.0 standard drinks of alcohol   Drug use: No   Sexual activity: Yes    Partners: Male    Birth control/protection: None  Other Topics Concern   Not on file  Social History Narrative   Not on file   Social Determinants of Health   Financial Resource Strain: Not on file  Food Insecurity: Food Insecurity Present (04/12/2020)   Hunger Vital Sign    Worried About Running Out of Food in the Last Year: Sometimes true    Ran Out of Food in the Last Year: Sometimes true  Transportation Needs: No Transportation Needs (04/12/2020)   [place] - Transportation  Lack of Transportation (Medical): No    Lack of Transportation (Non-Medical): No  Physical Activity: Not on file  Stress: Not on file  Social Connections: Not on file  Intimate Partner Violence: Not on file    ROS Comprehensive ROS Pertinent positive and negative noted in HPI     Objective    Blood Pressure 126/86   Pulse 74   Respiration 16   Height '5\' 1"'$  (1.549 m)   Weight 190 lb (86.2 kg)   Oxygen Saturation 96%   Body Mass Index 35.90 kg/m   Physical exam: General: Vital signs reviewed.  Patient is well-developed and well-nourished, obese female in no acute distress and cooperative with exam. Head: Normocephalic and atraumatic. Eyes: EOMI, conjunctivae normal, no  scleral icterus. Neck: Supple, trachea midline, normal ROM, no JVD, masses, thyromegaly, or carotid bruit present. Cardiovascular: RRR, S1 normal, S2 normal, no murmurs, gallops, or rubs. Pulmonary/Chest: Clear to auscultation bilaterally, no wheezes, rales, or rhonchi. Abdominal: Soft, non-tender, non-distended, BS +, no masses, organomegaly, or guarding present. Musculoskeletal: No joint deformities, erythema, or stiffness, ROM full and nontender. Extremities: No lower extremity edema bilaterally,  pulses symmetric and intact bilaterally. No cyanosis or clubbing. Neurological: A&O x3, Strength is normal Skin: Warm, dry and intact. No rashes or erythema. Psychiatric: Normal mood and affect. speech and behavior is normal. Cognition and memory are normal.  Assessment & Plan:  Catherine Grimes was seen today for new patient (initial visit).  Diagnoses and all orders for this visit:  Type 2 diabetes mellitus without complication, without long-term current use of insulin ([place]) - educated on lifestyle modifications, including but not limited to diet choices and adding exercise to daily routine.   -     POCT glycosylated hemoglobin (Hb A1C) 7.4  -     Microalbumin / creatinine urine ratio -    increased to  metFORMIN (GLUCOPHAGE) 1000 MG tablet; Take 1 tablet (1,000 mg total) by mouth 2 (two) times daily with a meal. -     CMP14+EGFR -     Lipid Panel  Obesity (BMI 30.0-34.9) Obesity is 30-39 indicating an excess in caloric intake or underlining conditions. This may lead to other co-morbidities. Educated on lifestyle modifications of diet and exercise which may reduce obesity.    Encounter to establish care  Human immunodeficiency virus (HIV) disease ([place]) Followed by [facility]      Kerin Perna, NP

## 2022-10-03 ENCOUNTER — Other Ambulatory Visit (INDEPENDENT_AMBULATORY_CARE_PROVIDER_SITE_OTHER): Payer: Self-pay | Admitting: Primary Care

## 2022-10-03 DIAGNOSIS — E782 Mixed hyperlipidemia: Secondary | ICD-10-CM

## 2022-10-03 LAB — CMP14+EGFR
ALT: 53 IU/L — ABNORMAL HIGH (ref 0–32)
AST: 31 IU/L (ref 0–40)
Albumin/Globulin Ratio: 1.6 (ref 1.2–2.2)
Albumin: 4.5 g/dL (ref 3.9–4.9)
Alkaline Phosphatase: 97 IU/L (ref 44–121)
BUN/Creatinine Ratio: 13 (ref 9–23)
BUN: 8 mg/dL (ref 6–20)
Bilirubin Total: 0.5 mg/dL (ref 0.0–1.2)
CO2: 22 mmol/L (ref 20–29)
Calcium: 9.2 mg/dL (ref 8.7–10.2)
Chloride: 99 mmol/L (ref 96–106)
Creatinine, Ser: 0.63 mg/dL (ref 0.57–1.00)
Globulin, Total: 2.8 g/dL (ref 1.5–4.5)
Glucose: 93 mg/dL (ref 70–99)
Potassium: 3.7 mmol/L (ref 3.5–5.2)
Sodium: 139 mmol/L (ref 134–144)
Total Protein: 7.3 g/dL (ref 6.0–8.5)
eGFR: 116 mL/min/{1.73_m2} (ref >59)

## 2022-10-03 LAB — LIPID PANEL
Chol/HDL Ratio: 3.9 ratio (ref 0.0–4.4)
Cholesterol, Total: 179 mg/dL (ref 100–199)
HDL: 46 mg/dL (ref >39)
LDL Chol Calc (NIH): 101 mg/dL — ABNORMAL HIGH (ref 0–99)
Triglycerides: 188 mg/dL — ABNORMAL HIGH (ref 0–149)
VLDL Cholesterol Cal: 32 mg/dL (ref 5–40)

## 2022-10-03 LAB — MICROALBUMIN / CREATININE URINE RATIO
Creatinine, Urine: 117.8 mg/dL
Microalb/Creat Ratio: 66 mg/g creat — ABNORMAL HIGH (ref 0–29)
Microalbumin, Urine: 77.9 ug/mL

## 2022-10-03 MED ORDER — PRAVASTATIN SODIUM 20 MG PO TABS: 20.0000 mg | ORAL_TABLET | Freq: Every day | ORAL | 3 refills | Status: AC

## 2022-10-07 ENCOUNTER — Telehealth: Payer: Self-pay

## 2022-10-07 DIAGNOSIS — K769 Liver disease, unspecified: Secondary | ICD-10-CM

## 2022-10-07 NOTE — Telephone Encounter (Signed)
Recommend referral to Internal Medicine to counsel on the liver findings:  Mild hepatic steatosis.  1 cm subcapsular hypervascular lesion in posterior right hepatic  lobe, which may represent a perfusion anomaly, although hepatic mass  cannot definitely be excluded. Abdomen MRI without and with contrast  is recommended for further characterization.

## 2022-10-08 ENCOUNTER — Encounter (INDEPENDENT_AMBULATORY_CARE_PROVIDER_SITE_OTHER): Payer: Self-pay

## 2022-10-08 NOTE — Telephone Encounter (Signed)
Amb referral order placed with [facility].  Routed to [place] to schedule.

## 2022-10-10 ENCOUNTER — Encounter: Payer: Self-pay | Admitting: Internal Medicine

## 2022-10-10 ENCOUNTER — Other Ambulatory Visit: Payer: Self-pay

## 2022-10-10 ENCOUNTER — Ambulatory Visit: Payer: Self-pay | Admitting: Internal Medicine

## 2022-10-10 VITALS — BP 118/79 | HR 76 | Temp 98.7°F | Resp 16 | Wt 190.2 lb

## 2022-10-10 DIAGNOSIS — B20 Human immunodeficiency virus [HIV] disease: Secondary | ICD-10-CM

## 2022-10-10 DIAGNOSIS — Z23 Encounter for immunization: Secondary | ICD-10-CM

## 2022-10-10 NOTE — Progress Notes (Signed)
[facility] for Infectious Disease   HPI: Catherine Grimes is a 42 y.o. female female Spanish-speaking HIV on [place] emigrated from [place] in [place].  Translator was used.  Reports 100% adherence to ART She reports she has been followed by gynecology for pelvic possible polycystic disease.  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 Attempting ot get pregnant Date of diagnosis 2012 ART exposure Odefsey+ Past OIs Risk factors: Unsure, diagnosed at a [facility] in last 17months 1, in the last 12 months.  Anal sex no Oral sex,no Vaginal penile sex, contraception no   Social: Occupation: Auction cars, Dance movement psychotherapist.   Housing: house Support: Married one year ago, he know about her HIV status. He is not HIV positive Understanding of HIV: fair Etoh/drug/tobacco use: none  Past Medical History:  Diagnosis Date   dm type 2 02/26/2021   pt does not check cbg   HIV infection ([place])    Vulvar cyst 02/26/2021    Past Surgical History:  Procedure Laterality Date   NO PAST SURGERIES     VULVAR LESION REMOVAL Right 03/01/2021   Procedure: Vulvar Cyst Excision RIGHT (11426);  Surgeon: Jaquita Folds, MD;  [facility];  Service: Gynecology;  Laterality: Right;    Family History  Problem Relation Age of Onset   Diabetes Mother    Diabetes Father    Diabetes Sister    Cancer Sister    Current Outpatient Medications on File Prior to Visit  Medication Sig Dispense Refill   acetaminophen (TYLENOL) 500 MG tablet Take 1 tablet (500 mg total) by mouth every 6 (six) hours as needed (pain). (Patient not taking: Reported on 10/02/2022) 30 tablet 0   drospirenone-ethinyl estradiol (YAZ) 3-0.02 MG tablet Take 1 tablet by mouth daily.  (Patient not taking: Reported on 10/02/2022) 84 tablet 4   ibuprofen (ADVIL) 600 MG tablet Take 1 tablet (600 mg total) by mouth every 6 (six) hours as needed. (Patient not taking: Reported on 10/02/2022) 30 tablet 0   metFORMIN (GLUCOPHAGE) 1000 MG tablet Take 1 tablet (1,000 mg total) by mouth 2 (two) times daily with a meal. 180 tablet 3   Multiple Vitamin (MULTIVITAMIN PO) Take by mouth. (Patient not taking: Reported on 10/02/2022)     ODEFSEY 200-25-25 MG TABS tablet TAKE 1 TABLET BY MOUTH DAILY WITH BREAKFAST 30 tablet 6   pravastatin (PRAVACHOL) 20 MG tablet Take 1 tablet (20 mg total) by mouth daily. 90 tablet 3   No current facility-administered medications on file prior to visit.    No Known Allergies    Lab Results HIV 1 RNA Quant (Copies/mL)  Date Value  09/18/2022 Not Detected  03/11/2022 Not Detected  06/06/2021 Not Detected   CD4 T Cell Abs (/uL)  Date Value  09/18/2022 1,107  03/11/2022 1,226  06/06/2021 1,076   No results found for: "HIV1GENOSEQ" Lab Results  Component Value Date   WBC 7.3 09/18/2022   HGB 14.0 09/18/2022   HCT 39.8 09/18/2022   MCV 84.7 09/18/2022   PLT 277 09/18/2022    Lab Results  Component Value Date   CREATININE 0.63 10/02/2022   BUN 8 10/02/2022   NA 139 10/02/2022   K 3.7 10/02/2022   CL 99 10/02/2022   CO2 22 10/02/2022  Lab Results  Component Value Date   ALT 53 (H) 10/02/2022   AST 31 10/02/2022   ALKPHOS 97 10/02/2022   BILITOT 0.5 10/02/2022    Lab Results  Component Value Date   CHOL 179 10/02/2022   TRIG 188 (H) 10/02/2022   HDL 46 10/02/2022   LDLCALC 101 (H) 10/02/2022   Lab Results  Component Value Date   HAV POS (A) 12/12/2009   Lab Results  Component Value Date   HEPBSAG NEGATIVE 08/27/2011   HEPBSAB REACTIVE (A) 04/04/2022   Lab Results  Component Value Date   HCVAB NEG 12/12/2009   Lab Results  Component Value Date   CHLAMYDIAWP Negative 03/11/2022   N Negative 03/11/2022   Lab Results   Component Value Date   GCPROBEAPT NEGATIVE 03/01/2013   No results found for: "QUANTGOLD"  Assessment/Plan #HIV/Asymptomatic #Spanish speaking -interpretor present -CD4 1226, VL ND on 03/11/22 -Continue Odefsey(kept on current regimen as pt planso n getting pregnant) -F/U in 6 months   #Vaccination COVID x2, needs booster Flu-today 10/10/22 PCV -UTD, needs PCV 20 on 05/14/2023 Meningitis UTD HepA AB+ on 12/12/09 HEpB Ab series x 2 , AAB+ on 04/04/22 Tdap today 10/10/22       # DM on metformin -On metformin, managed by primary care  #Health maintenance -Quantiferon negative on 04/04/22 -RPR NR on 04/04/22 -HCV NR on 04/04/22 -GC urine negative on 8/14     #Dysplasia screen female -followed by [facility]  -she has polycystic ovarian disease, followed by gyn       Laurice Record, [facility]ious Disease [place]   I have personally spent 41 minutes involved in face-to-face and non-face-to-face activities for this patient on the day of the visit. Professional time spent includes the following activities: Preparing to see the patient (review of tests), Obtaining and/or reviewing separately obtained history (admission/discharge record), Performing a medically appropriate examination and/or evaluation , Ordering medications/tests/procedures, referring and communicating with other health care professionals, Documenting clinical information in the EMR, Independently interpreting results (not separately reported), Communicating results to the patient/family/caregiver, Counseling and educating the patient/family/caregiver and Care coordination (not separately reported).

## 2022-12-13 ENCOUNTER — Other Ambulatory Visit: Payer: Self-pay | Admitting: Internal Medicine

## 2022-12-13 DIAGNOSIS — B20 Human immunodeficiency virus [HIV] disease: Secondary | ICD-10-CM

## 2022-12-16 ENCOUNTER — Other Ambulatory Visit: Payer: Self-pay

## 2022-12-16 DIAGNOSIS — B20 Human immunodeficiency virus [HIV] disease: Secondary | ICD-10-CM

## 2022-12-16 MED ORDER — ODEFSEY 200-25-25 MG PO TABS: 1.0000 | ORAL_TABLET | Freq: Every day | ORAL | 4 refills | Status: DC

## 2023-01-01 ENCOUNTER — Ambulatory Visit (INDEPENDENT_AMBULATORY_CARE_PROVIDER_SITE_OTHER): Payer: Self-pay | Admitting: Primary Care

## 2023-02-05 ENCOUNTER — Ambulatory Visit: Payer: Self-pay

## 2023-03-19 ENCOUNTER — Other Ambulatory Visit: Payer: Self-pay

## 2023-03-19 DIAGNOSIS — B20 Human immunodeficiency virus [HIV] disease: Secondary | ICD-10-CM

## 2023-03-19 DIAGNOSIS — Z113 Encounter for screening for infections with a predominantly sexual mode of transmission: Secondary | ICD-10-CM

## 2023-03-20 ENCOUNTER — Other Ambulatory Visit: Payer: Self-pay

## 2023-03-20 ENCOUNTER — Ambulatory Visit: Payer: Self-pay

## 2023-03-20 DIAGNOSIS — B20 Human immunodeficiency virus [HIV] disease: Secondary | ICD-10-CM

## 2023-03-20 DIAGNOSIS — Z113 Encounter for screening for infections with a predominantly sexual mode of transmission: Secondary | ICD-10-CM

## 2023-03-21 LAB — T-HELPER CELL (CD4) - (RCID CLINIC ONLY)
CD4 % Helper T Cell: 48 % (ref 33–65)
CD4 T Cell Abs: 1120 /uL (ref 400–1790)

## 2023-03-22 LAB — COMPLETE METABOLIC PANEL WITH GFR
AG Ratio: 1.3 (calc) (ref 1.0–2.5)
ALT: 74 U/L — ABNORMAL HIGH (ref 6–29)
AST: 39 U/L — ABNORMAL HIGH (ref 10–30)
Albumin: 4.3 g/dL (ref 3.6–5.1)
Alkaline phosphatase (APISO): 130 U/L — ABNORMAL HIGH (ref 31–125)
BUN: 10 mg/dL (ref 7–25)
CO2: 24 mmol/L (ref 20–32)
Calcium: 9.7 mg/dL (ref 8.6–10.2)
Chloride: 99 mmol/L (ref 98–110)
Creat: 0.59 mg/dL (ref 0.50–0.99)
Globulin: 3.2 g/dL (calc) (ref 1.9–3.7)
Glucose, Bld: 253 mg/dL — ABNORMAL HIGH (ref 65–139)
Potassium: 3.9 mmol/L (ref 3.5–5.3)
Sodium: 134 mmol/L — ABNORMAL LOW (ref 135–146)
Total Bilirubin: 0.3 mg/dL (ref 0.2–1.2)
Total Protein: 7.5 g/dL (ref 6.1–8.1)
eGFR: 117 mL/min/{1.73_m2} (ref >=60)

## 2023-03-22 LAB — CBC WITH DIFFERENTIAL/PLATELET
Absolute Monocytes: 402 {cells}/uL (ref 200–950)
Basophils Absolute: 60 {cells}/uL (ref 0–200)
Basophils Relative: 0.9 %
Eosinophils Absolute: 147 {cells}/uL (ref 15–500)
Eosinophils Relative: 2.2 %
HCT: 42.7 % (ref 35.0–45.0)
Hemoglobin: 14.9 g/dL (ref 11.7–15.5)
Lymphs Abs: 2318 {cells}/uL (ref 850–3900)
MCH: 29.5 pg (ref 27.0–33.0)
MCHC: 34.9 g/dL (ref 32.0–36.0)
MCV: 84.6 fL (ref 80.0–100.0)
MPV: 11.1 fL (ref 7.5–12.5)
Monocytes Relative: 6 %
Neutro Abs: 3772 {cells}/uL (ref 1500–7800)
Neutrophils Relative %: 56.3 %
Platelets: 271 10*3/uL (ref 140–400)
RBC: 5.05 10*6/uL (ref 3.80–5.10)
RDW: 12.9 % (ref 11.0–15.0)
Total Lymphocyte: 34.6 %
WBC: 6.7 10*3/uL (ref 3.8–10.8)

## 2023-03-22 LAB — RPR: RPR Ser Ql: NONREACTIVE

## 2023-03-22 LAB — HIV-1 RNA QUANT-NO REFLEX-BLD
HIV 1 RNA Quant: <20 {copies}/mL — ABNORMAL HIGH
HIV-1 RNA Quant, Log: <1.3 {Log_copies}/mL — ABNORMAL HIGH

## 2023-03-24 LAB — URINE CYTOLOGY ANCILLARY ONLY
Chlamydia: NEGATIVE
Comment: NEGATIVE
Comment: NORMAL
Neisseria Gonorrhea: NEGATIVE

## 2023-04-01 NOTE — Progress Notes (Signed)
Appt 04/02/23

## 2023-04-02 ENCOUNTER — Encounter: Payer: Self-pay | Admitting: Internal Medicine

## 2023-04-02 ENCOUNTER — Ambulatory Visit (INDEPENDENT_AMBULATORY_CARE_PROVIDER_SITE_OTHER): Payer: Self-pay | Admitting: Internal Medicine

## 2023-04-02 ENCOUNTER — Other Ambulatory Visit: Payer: Self-pay

## 2023-04-02 VITALS — BP 127/75 | HR 73 | Temp 98.6°F | Wt 194.0 lb

## 2023-04-02 DIAGNOSIS — Z23 Encounter for immunization: Secondary | ICD-10-CM

## 2023-04-02 DIAGNOSIS — B20 Human immunodeficiency virus [HIV] disease: Secondary | ICD-10-CM

## 2023-04-02 DIAGNOSIS — Z21 Asymptomatic human immunodeficiency virus [HIV] infection status: Secondary | ICD-10-CM

## 2023-04-02 MED ORDER — ODEFSEY 200-25-25 MG PO TABS
1.0000 | ORAL_TABLET | Freq: Every day | ORAL | 6 refills | Status: DC
Start: 2023-04-02 — End: 2023-11-12

## 2023-04-02 NOTE — Progress Notes (Signed)
[facility]     HPI: Catherine Grimes is a 42 y.o. female  female Spanish-speaking(interpretor used) HIV on [place] emigrated from [place] in 2001.  Translator was used.  Reports 100% adherence to ART She reports she has been followed by gynecology for pelvic possible polycystic disease.  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 Attempting ot get pregnant Date of diagnosis 2012 ART exposure Odefsey+ Past OIs Risk factors: Unsure, diagnosed at [facility] in last 2months 1, in the last 12 months.  Anal sex no Oral sex,no Vaginal penile sex, contraception no   Social: Occupation: Auction cars, Engineering geologist.   Housing: house Support: Married one year ago, he know about her HIV status. He is not HIV positive Understanding of HIV: fair Etoh/drug/tobacco use: none  Past Medical History:  Diagnosis Date   dm type 2 02/26/2021   pt does not check cbg   HIV infection (HCC)    Vulvar cyst 02/26/2021    Past Surgical History:  Procedure Laterality Date   NO PAST SURGERIES     VULVAR LESION REMOVAL Right 03/01/2021   Procedure: Vulvar Cyst Excision RIGHT (11426);  Surgeon: Marguerita Beards, MD;  Location: [facility];  Service: Gynecology;  Laterality: Right;    Family History  Problem Relation Age of Onset   Diabetes Mother    Diabetes Father    Diabetes Sister    Cancer Sister    Current Outpatient Medications on File Prior to Visit  Medication Sig Dispense Refill   acetaminophen (TYLENOL) 500 MG tablet Take 1 tablet (500 mg total) by mouth every 6 (six) hours as needed (pain). (Patient not taking: Reported on 10/02/2022) 30 tablet 0   drospirenone-ethinyl estradiol (YAZ) 3-0.02 MG tablet Take 1  tablet by mouth daily. (Patient not taking: Reported on 10/02/2022) 84 tablet 4   emtricitabine-rilpivir-tenofovir AF (ODEFSEY) 200-25-25 MG TABS tablet Take 1 tablet by mouth daily with breakfast. 30 tablet 4   ibuprofen (ADVIL) 600 MG tablet Take 1 tablet (600 mg total) by mouth every 6 (six) hours as needed. (Patient not taking: Reported on 10/02/2022) 30 tablet 0   metFORMIN (GLUCOPHAGE) 1000 MG tablet Take 1 tablet (1,000 mg total) by mouth 2 (two) times daily with a meal. 180 tablet 3   Multiple Vitamin (MULTIVITAMIN PO) Take by mouth. (Patient not taking: Reported on 10/02/2022)     pravastatin (PRAVACHOL) 20 MG tablet Take 1 tablet (20 mg total) by mouth daily. (Patient not taking: Reported on 10/10/2022) 90 tablet 3   No current facility-administered medications on file prior to visit.    No Known Allergies    Lab Results HIV 1 RNA Quant (Copies/mL)  Date Value  03/20/2023 <20 (H)  09/18/2022 Not Detected  03/11/2022 Not Detected   CD4 T Cell Abs (/uL)  Date Value  03/20/2023 1,120  09/18/2022 1,107  03/11/2022 1,226   No results found for: "HIV1GENOSEQ" Lab Results  Component Value Date   WBC 6.7 03/20/2023   HGB 14.9 03/20/2023   HCT 42.7 03/20/2023   MCV 84.6 03/20/2023   PLT 271 03/20/2023    Lab Results  Component Value Date   CREATININE 0.59 03/20/2023   BUN 10 03/20/2023   NA 134 (L) 03/20/2023  K 3.9 03/20/2023   CL 99 03/20/2023   CO2 24 03/20/2023   Lab Results  Component Value Date   ALT 74 (H) 03/20/2023   AST 39 (H) 03/20/2023   ALKPHOS 97 10/02/2022   BILITOT 0.3 03/20/2023    Lab Results  Component Value Date   CHOL 179 10/02/2022   TRIG 188 (H) 10/02/2022   HDL 46 10/02/2022   LDLCALC 101 (H) 10/02/2022   Lab Results  Component Value Date   HAV POS (A) 12/12/2009   Lab Results  Component Value Date   HEPBSAG NEGATIVE 08/27/2011   HEPBSAB REACTIVE (A) 04/04/2022   Lab Results  Component Value Date   HCVAB NEG 12/12/2009   Lab  Results  Component Value Date   CHLAMYDIAWP Negative 03/20/2023   N Negative 03/20/2023   Lab Results  Component Value Date   GCPROBEAPT NEGATIVE 03/01/2013   No results found for: "QUANTGOLD"  Assessment/Plan #HIV/Asymptomatic -[id number], VLnd, on 03/20/23 #Spanish speaking -interpretor present -CD4 1226, VL ND on 03/11/22 -Continue Odefsey(kept on current regimen as pt planso n getting pregnant) -F/U in 6 months   #Vaccination COVID x2, needs booster Flu-today  PCV -UTD, needs PCV 20 on 05/14/2023 Meningitis UTD HepA AB+ on 12/12/09 HEpB Ab series x 2 , AAB+ on 04/04/22 Tdap today       10/10/22       # DM on metformin -On metformin, managed by primary care   #Health maintenance -Quantiferon negative on 04/04/22->NV -RPR NR 03/20/23 -HCV NR on 04/04/22->NV -GC urine negative on 03/20/23     #Dysplasia screen female -followed by [facility]  -she has polycystic ovarian disease, followed by gyn  Catherine Earthly, MD [facility] I have personally spent 50 minutes involved in face-to-face and non-face-to-face activities for this patient on the day of the visit. Professional time spent includes the following activities: Preparing to see the patient (review of tests), Obtaining and/or reviewing separately obtained history (admission/discharge record), Performing a medically appropriate examination and/or evaluation , Ordering medications/tests/procedures, referring and communicating with other health care professionals, Documenting clinical information in the EMR, Independently interpreting results (not separately reported), Communicating results to the patient/family/caregiver, Counseling and educating the patient/family/caregiver and Care coordination (not separately reported).

## 2023-10-06 ENCOUNTER — Telehealth: Payer: Self-pay

## 2023-10-06 NOTE — Telephone Encounter (Signed)
 error

## 2023-10-07 ENCOUNTER — Other Ambulatory Visit: Payer: Self-pay

## 2023-10-07 DIAGNOSIS — B20 Human immunodeficiency virus [HIV] disease: Secondary | ICD-10-CM

## 2023-10-07 DIAGNOSIS — Z113 Encounter for screening for infections with a predominantly sexual mode of transmission: Secondary | ICD-10-CM

## 2023-10-07 DIAGNOSIS — Z79899 Other long term (current) drug therapy: Secondary | ICD-10-CM

## 2023-10-15 ENCOUNTER — Other Ambulatory Visit: Payer: Self-pay

## 2023-10-29 ENCOUNTER — Other Ambulatory Visit: Payer: Self-pay

## 2023-10-29 ENCOUNTER — Encounter: Payer: Self-pay | Admitting: Internal Medicine

## 2023-10-29 DIAGNOSIS — B20 Human immunodeficiency virus [HIV] disease: Secondary | ICD-10-CM

## 2023-10-29 DIAGNOSIS — Z79899 Other long term (current) drug therapy: Secondary | ICD-10-CM

## 2023-10-29 DIAGNOSIS — Z113 Encounter for screening for infections with a predominantly sexual mode of transmission: Secondary | ICD-10-CM

## 2023-10-29 NOTE — Addendum Note (Signed)
 Addended by: Tressa Busman T on: 10/29/2023 03:45 PM   Modules accepted: Orders

## 2023-10-30 LAB — C. TRACHOMATIS/N. GONORRHOEAE RNA
C. trachomatis RNA, TMA: NOT DETECTED
N. gonorrhoeae RNA, TMA: NOT DETECTED

## 2023-10-31 LAB — COMPLETE METABOLIC PANEL WITHOUT GFR
AG Ratio: 1.7 (calc) (ref 1.0–2.5)
ALT: 38 U/L — ABNORMAL HIGH (ref 6–29)
AST: 25 U/L (ref 10–30)
Albumin: 4.3 g/dL (ref 3.6–5.1)
Alkaline phosphatase (APISO): 106 U/L (ref 31–125)
BUN: 7 mg/dL (ref 7–25)
CO2: 22 mmol/L (ref 20–32)
Calcium: 9.5 mg/dL (ref 8.6–10.2)
Chloride: 102 mmol/L (ref 98–110)
Creat: 0.57 mg/dL (ref 0.50–0.99)
Globulin: 2.5 g/dL (ref 1.9–3.7)
Glucose, Bld: 215 mg/dL — ABNORMAL HIGH (ref 65–99)
Potassium: 3.6 mmol/L (ref 3.5–5.3)
Sodium: 135 mmol/L (ref 135–146)
Total Bilirubin: 0.4 mg/dL (ref 0.2–1.2)
Total Protein: 6.8 g/dL (ref 6.1–8.1)

## 2023-10-31 LAB — CBC WITH DIFFERENTIAL/PLATELET
Absolute Lymphocytes: 2477 {cells}/uL (ref 850–3900)
Absolute Monocytes: 367 {cells}/uL (ref 200–950)
Basophils Absolute: 43 {cells}/uL (ref 0–200)
Basophils Relative: 0.6 %
Eosinophils Absolute: 223 {cells}/uL (ref 15–500)
Eosinophils Relative: 3.1 %
HCT: 41.5 % (ref 35.0–45.0)
Hemoglobin: 14.1 g/dL (ref 11.7–15.5)
MCH: 29 pg (ref 27.0–33.0)
MCHC: 34 g/dL (ref 32.0–36.0)
MCV: 85.4 fL (ref 80.0–100.0)
MPV: 10.8 fL (ref 7.5–12.5)
Monocytes Relative: 5.1 %
Neutro Abs: 4090 {cells}/uL (ref 1500–7800)
Neutrophils Relative %: 56.8 %
Platelets: 289 10*3/uL (ref 140–400)
RBC: 4.86 10*6/uL (ref 3.80–5.10)
RDW: 12.6 % (ref 11.0–15.0)
Total Lymphocyte: 34.4 %
WBC: 7.2 10*3/uL (ref 3.8–10.8)

## 2023-10-31 LAB — LIPID PANEL
Cholesterol: 183 mg/dL (ref <200)
HDL: 49 mg/dL — ABNORMAL LOW (ref >=50)
LDL Cholesterol (Calc): 88 mg/dL
Non-HDL Cholesterol (Calc): 134 mg/dL — ABNORMAL HIGH (ref <130)
Total CHOL/HDL Ratio: 3.7 (calc) (ref <5.0)
Triglycerides: 344 mg/dL — ABNORMAL HIGH (ref <150)

## 2023-10-31 LAB — T-HELPER CELLS (CD4) COUNT (NOT AT ARMC)
Absolute CD4: 1196 {cells}/uL (ref 490–1740)
CD4 T Helper %: 50 % (ref 30–61)
Total lymphocyte count: 2401 {cells}/uL (ref 850–3900)

## 2023-10-31 LAB — RPR: RPR Ser Ql: NONREACTIVE

## 2023-10-31 LAB — HIV-1 RNA QUANT-NO REFLEX-BLD
HIV 1 RNA Quant: NOT DETECTED {copies}/mL
HIV-1 RNA Quant, Log: NOT DETECTED {Log_copies}/mL

## 2023-11-12 ENCOUNTER — Ambulatory Visit (INDEPENDENT_AMBULATORY_CARE_PROVIDER_SITE_OTHER): Payer: Self-pay | Admitting: Internal Medicine

## 2023-11-12 ENCOUNTER — Other Ambulatory Visit: Payer: Self-pay

## 2023-11-12 ENCOUNTER — Encounter: Payer: Self-pay | Admitting: Internal Medicine

## 2023-11-12 DIAGNOSIS — B20 Human immunodeficiency virus [HIV] disease: Secondary | ICD-10-CM

## 2023-11-12 DIAGNOSIS — Z21 Asymptomatic human immunodeficiency virus [HIV] infection status: Secondary | ICD-10-CM

## 2023-11-12 MED ORDER — ODEFSEY 200-25-25 MG PO TABS: 1.0000 | ORAL_TABLET | Freq: Every day | ORAL | 6 refills | Status: DC

## 2023-11-12 NOTE — Progress Notes (Signed)
 [facility]     [id number] Catherine Grimes is a 43 y.o. female  female Spanish-speaking(interpretor used) HIV on Odefsey  emigrated from [place] in 2001.  Translator was used.  Reports 100% adherence to ART She reports she has been followed by gynecology for pelvic possible polycystic disease.  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  Today 10/10/22: one missed dose since last visit. Attempting ot get pregnant Date of diagnosis 2012 ART exposure Odefsey + Past OIs Risk factors: Unsure, diagnosed [facility] in last 2months 1, in the last 12 months.  Anal sex no Oral sex,no Vaginal penile sex, contraception no   Social: Occupation: Auction cars, Engineering geologist.   Housing: house Support: Married one year ago, he know about her HIV status. He is not HIV positive Understanding of HIV: fair Etoh/drug/tobacco use: none  Past Medical History:  Diagnosis Date   dm type 2 02/26/2021   pt does not check cbg   HIV infection (HCC)    Vulvar cyst 02/26/2021    Past Surgical History:  Procedure Laterality Date   NO PAST SURGERIES     VULVAR LESION REMOVAL Right 03/01/2021   Procedure: Vulvar Cyst Excision RIGHT (11426);  Surgeon: Arma Lamp, MD;  Location: [facility];  Service: Gynecology;  Laterality: Right;    Family History  Problem Relation Age of Onset   Diabetes Mother    Diabetes Father    Diabetes Sister    Cancer Sister    Current Outpatient Medications on File Prior to Visit  Medication Sig Dispense Refill   acetaminophen  (TYLENOL ) 500 MG tablet Take 1 tablet (500 mg total) by mouth every 6 (six) hours as needed (pain). 30 tablet 0   emtricitabine -rilpivir-tenofovir  AF (ODEFSEY ) 200-25-25 MG TABS tablet Take 1  tablet by mouth daily with breakfast. 30 tablet 6   ibuprofen  (ADVIL ) 600 MG tablet Take 1 tablet (600 mg total) by mouth every 6 (six) hours as needed. 30 tablet 0   drospirenone -ethinyl estradiol  (YAZ) 3-0.02 MG tablet Take 1 tablet by mouth daily. (Patient not taking: Reported on 11/12/2023) 84 tablet 4   metFORMIN  (GLUCOPHAGE ) 1000 MG tablet Take 1 tablet (1,000 mg total) by mouth 2 (two) times daily with a meal. (Patient not taking: Reported on 11/12/2023) 180 tablet 3   Multiple Vitamin (MULTIVITAMIN PO) Take by mouth. (Patient not taking: Reported on 11/12/2023)     pravastatin  (PRAVACHOL ) 20 MG tablet Take 1 tablet (20 mg total) by mouth daily. (Patient not taking: Reported on 11/12/2023) 90 tablet 3   No current facility-administered medications on file prior to visit.    No Known Allergies    Lab Results HIV 1 RNA Quant  Date Value  10/29/2023 NOT DETECTED copies/mL  03/20/2023 <20 Copies/mL (H)  09/18/2022 Not Detected Copies/mL   CD4 T Cell Abs (/uL)  Date Value  03/20/2023 1,120  09/18/2022 1,107  03/11/2022 1,226   No results found for: "HIV1GENOSEQ" Lab Results  Component Value Date   WBC 7.2 10/29/2023   HGB 14.1 10/29/2023   HCT 41.5 10/29/2023   MCV 85.4 10/29/2023   PLT 289 10/29/2023    Lab Results  Component Value Date   CREATININE 0.57 10/29/2023   BUN 7 10/29/2023   NA 135 10/29/2023  K 3.6 10/29/2023   CL 102 10/29/2023   CO2 22 10/29/2023   Lab Results  Component Value Date   ALT 38 (H) 10/29/2023   AST 25 10/29/2023   ALKPHOS 97 10/02/2022   BILITOT 0.4 10/29/2023    Lab Results  Component Value Date   CHOL 183 10/29/2023   TRIG 344 (H) 10/29/2023   HDL 49 (L) 10/29/2023   LDLCALC 88 10/29/2023   Lab Results  Component Value Date   HAV POS (A) 12/12/2009   Lab Results  Component Value Date   HEPBSAG NEGATIVE 08/27/2011   HEPBSAB REACTIVE (A) 04/04/2022   Lab Results  Component Value Date   HCVAB NEG 12/12/2009   Lab Results   Component Value Date   CHLAMYDIAWP Negative 03/20/2023   N Negative 03/20/2023   Lab Results  Component Value Date   GCPROBEAPT NEGATIVE 03/01/2013   No results found for: "QUANTGOLD"  Assessment/Plan #HIV/Asymptomatic -VL nD 10/29/23   #Spanish speaking -interpretor present -Continue Odefsey (kept on current regimen as pt plans on getting pregnant) -F/U in 6 months   #Vaccination COVID x2, needs booster Flu-today  PCV -UTD, needs PCV 20 on 05/14/2023 Meningitis UTD HepA AB+ on 12/12/09 HEpB Ab series x 2 , AAB+ on 04/04/22 Tdap today       10/10/22       # DM on metformin  -On metformin , managed by primary care   #Health maintenance -Quantiferon negative on 04/04/22->NV -RPR NR 03/20/23 -HCV NR on 04/04/22->NV -GC urine negative on 10/29/23     #Dysplasia screen female -followed by Son[facility] Hodgkins  -she has polycystic ovarian disease, followed by gyn    Orlie Bjornstad, MD [facility]  I have personally spent 42 minutes involved in face-to-face and non-face-to-face activities for this patient on the day of the visit. Professional time spent includes the following activities: Preparing to see the patient (review of tests), Obtaining and/or reviewing separately obtained history (admission/discharge record), Performing a medically appropriate examination and/or evaluation , Ordering medications/tests/procedures, referring and communicating with other health care professionals, Documenting clinical information in the EMR, Independently interpreting results (not separately reported), Communicating results to the patient/family/caregiver, Counseling and educating the patient/family/caregiver and Care coordination (not separately reported).

## 2023-11-12 NOTE — Patient Instructions (Addendum)
 Follow-up with gyn for pap Lavoie, Marie-Lyne, MD [phone]

## 2023-12-09 NOTE — Progress Notes (Signed)
 The 10-year ASCVD risk score (Arnett DK, et al., 2019) is: 1.1%   Values used to calculate the score:     Age: 43 years     Sex: Female     Is Non-Hispanic African American: No     Diabetic: Yes     Tobacco smoker: No     Systolic Blood Pressure: 126 mmHg     Is BP treated: No     HDL Cholesterol: 49 mg/dL     Total Cholesterol: 183 mg/dL  Arlon Bergamo, BSN, RN

## 2024-02-11 ENCOUNTER — Ambulatory Visit: Payer: Self-pay

## 2024-02-11 ENCOUNTER — Other Ambulatory Visit: Payer: Self-pay

## 2024-04-27 ENCOUNTER — Other Ambulatory Visit: Payer: Self-pay

## 2024-04-27 DIAGNOSIS — Z113 Encounter for screening for infections with a predominantly sexual mode of transmission: Secondary | ICD-10-CM

## 2024-04-27 DIAGNOSIS — Z79899 Other long term (current) drug therapy: Secondary | ICD-10-CM

## 2024-04-27 DIAGNOSIS — B20 Human immunodeficiency virus [HIV] disease: Secondary | ICD-10-CM

## 2024-04-28 ENCOUNTER — Other Ambulatory Visit: Payer: Self-pay

## 2024-04-28 DIAGNOSIS — Z113 Encounter for screening for infections with a predominantly sexual mode of transmission: Secondary | ICD-10-CM

## 2024-04-28 DIAGNOSIS — B20 Human immunodeficiency virus [HIV] disease: Secondary | ICD-10-CM

## 2024-04-29 LAB — T-HELPER CELL (CD4) - (RCID CLINIC ONLY)
CD4 % Helper T Cell: 50 % (ref 33–65)
CD4 T Cell Abs: 1061 /uL (ref 400–1790)

## 2024-04-29 LAB — URINE CYTOLOGY ANCILLARY ONLY
Chlamydia: NEGATIVE
Comment: NEGATIVE
Comment: NORMAL
Neisseria Gonorrhea: NEGATIVE

## 2024-04-30 LAB — CBC WITH DIFFERENTIAL/PLATELET
Absolute Lymphocytes: 2457 {cells}/uL (ref 850–3900)
Absolute Monocytes: 420 {cells}/uL (ref 200–950)
Basophils Absolute: 63 {cells}/uL (ref 0–200)
Basophils Relative: 0.9 %
Eosinophils Absolute: 161 {cells}/uL (ref 15–500)
Eosinophils Relative: 2.3 %
HCT: 40.2 % (ref 35.0–45.0)
Hemoglobin: 13.7 g/dL (ref 11.7–15.5)
MCH: 30.6 pg (ref 27.0–33.0)
MCHC: 34.1 g/dL (ref 32.0–36.0)
MCV: 89.9 fL (ref 80.0–100.0)
MPV: 10.5 fL (ref 7.5–12.5)
Monocytes Relative: 6 %
Neutro Abs: 3899 {cells}/uL (ref 1500–7800)
Neutrophils Relative %: 55.7 %
Platelets: 286 Thousand/uL (ref 140–400)
RBC: 4.47 Million/uL (ref 3.80–5.10)
RDW: 12.3 % (ref 11.0–15.0)
Total Lymphocyte: 35.1 %
WBC: 7 Thousand/uL (ref 3.8–10.8)

## 2024-04-30 LAB — COMPLETE METABOLIC PANEL WITHOUT GFR
AG Ratio: 1.6 (calc) (ref 1.0–2.5)
ALT: 34 U/L — ABNORMAL HIGH (ref 6–29)
AST: 22 U/L (ref 10–30)
Albumin: 4.2 g/dL (ref 3.6–5.1)
Alkaline phosphatase (APISO): 92 U/L (ref 31–125)
BUN: 11 mg/dL (ref 7–25)
CO2: 24 mmol/L (ref 20–32)
Calcium: 9.1 mg/dL (ref 8.6–10.2)
Chloride: 102 mmol/L (ref 98–110)
Creat: 0.58 mg/dL (ref 0.50–0.99)
Globulin: 2.6 g/dL (ref 1.9–3.7)
Glucose, Bld: 185 mg/dL — ABNORMAL HIGH (ref 65–99)
Potassium: 3.8 mmol/L (ref 3.5–5.3)
Sodium: 134 mmol/L — ABNORMAL LOW (ref 135–146)
Total Bilirubin: 0.4 mg/dL (ref 0.2–1.2)
Total Protein: 6.8 g/dL (ref 6.1–8.1)

## 2024-04-30 LAB — HIV-1 RNA QUANT-NO REFLEX-BLD
HIV 1 RNA Quant: NOT DETECTED {copies}/mL
HIV-1 RNA Quant, Log: NOT DETECTED {Log_copies}/mL

## 2024-04-30 LAB — RPR: RPR Ser Ql: NONREACTIVE

## 2024-05-12 ENCOUNTER — Ambulatory Visit: Payer: Self-pay | Admitting: Internal Medicine

## 2024-05-19 ENCOUNTER — Ambulatory Visit (INDEPENDENT_AMBULATORY_CARE_PROVIDER_SITE_OTHER): Payer: Self-pay | Admitting: Internal Medicine

## 2024-05-19 ENCOUNTER — Other Ambulatory Visit: Payer: Self-pay

## 2024-05-19 ENCOUNTER — Encounter: Payer: Self-pay | Admitting: Internal Medicine

## 2024-05-19 VITALS — BP 169/109 | HR 83 | Temp 98.8°F | Ht 60.0 in | Wt 199.0 lb

## 2024-05-19 DIAGNOSIS — Z23 Encounter for immunization: Secondary | ICD-10-CM

## 2024-05-19 DIAGNOSIS — Z21 Asymptomatic human immunodeficiency virus [HIV] infection status: Secondary | ICD-10-CM

## 2024-05-19 DIAGNOSIS — B20 Human immunodeficiency virus [HIV] disease: Secondary | ICD-10-CM

## 2024-05-19 MED ORDER — ODEFSEY 200-25-25 MG PO TABS
1.0000 | ORAL_TABLET | Freq: Every day | ORAL | 6 refills | Status: AC
Start: 2024-05-19 — End: ?

## 2024-05-19 NOTE — Progress Notes (Signed)
 [facility]     HPI: Catherine Grimes is a 43 y.o.   female Spanish-speaking(interpretor used) HIV on Odefsey  emigrated from [place] in 2001.  Translator was used.  Reports 100% adherence to ART She reports she has been followed by gynecology for pelvic possible polycystic disease.  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  10/10/22: one missed dose since last visit. Attempting ot get pregnant Today: sexually acitve with husband. No missed doses of art.   Date of diagnosis 2012 ART exposure Odefsey + Past OIs Risk factors: Unsure, [facility] in last 2months 1, in the last 12 months.  Anal sex no Oral sex,no Vaginal penile sex, contraception no   Social: Occupation: Auction cars, engineering geologist.   Housing: house Support: Married one year ago, he know about her HIV status. He is not HIV positive Understanding of HIV: fair Etoh/drug/tobacco use: none  Past Medical History:  Diagnosis Date   dm type 2 02/26/2021   pt does not check cbg   HIV infection (HCC)    Vulvar cyst 02/26/2021    Past Surgical History:  Procedure Laterality Date   NO PAST SURGERIES     VULVAR LESION REMOVAL Right 03/01/2021   Procedure: Vulvar Cyst Excision RIGHT (11426);  Surgeon: Marilynne Rosaline SAILOR, MD;  Location: [facility];  Service: Gynecology;  Laterality: Right;    Family History  Problem Relation Age of Onset   Diabetes Mother    Diabetes Father    Diabetes Sister    Cancer Sister    Current Outpatient Medications on File Prior to Visit  Medication Sig Dispense Refill   acetaminophen  (TYLENOL ) 500 MG tablet Take 1 tablet (500 mg total) by mouth every 6 (six) hours as needed (pain). 30 tablet 0    emtricitabine -rilpivir-tenofovir  AF (ODEFSEY ) 200-25-25 MG TABS tablet Take 1 tablet by mouth daily with breakfast. 30 tablet 6   ibuprofen  (ADVIL ) 600 MG tablet Take 1 tablet (600 mg total) by mouth every 6 (six) hours as needed. 30 tablet 0   drospirenone -ethinyl estradiol  (YAZ) 3-0.02 MG tablet Take 1 tablet by mouth daily. (Patient not taking: Reported on 05/19/2024) 84 tablet 4   metFORMIN  (GLUCOPHAGE ) 1000 MG tablet Take 1 tablet (1,000 mg total) by mouth 2 (two) times daily with a meal. (Patient not taking: Reported on 05/19/2024) 180 tablet 3   Multiple Vitamin (MULTIVITAMIN PO) Take by mouth. (Patient not taking: Reported on 05/19/2024)     pravastatin  (PRAVACHOL ) 20 MG tablet Take 1 tablet (20 mg total) by mouth daily. (Patient not taking: Reported on 05/19/2024) 90 tablet 3   No current facility-administered medications on file prior to visit.    No Known Allergies    Lab Results HIV 1 RNA Quant  Date Value  04/28/2024 NOT DETECTED copies/mL  10/29/2023 NOT DETECTED copies/mL  03/20/2023 <20 Copies/mL (H)   CD4 T Cell Abs (/uL)  Date Value  04/28/2024 1,061  03/20/2023 1,120  09/18/2022 1,107   No results found for: HIV1GENOSEQ Lab Results  Component Value Date   WBC 7.0 04/28/2024   HGB 13.7 04/28/2024   HCT 40.2 04/28/2024   MCV 89.9 04/28/2024   PLT 286 04/28/2024    Lab Results  Component Value Date   CREATININE 0.58 04/28/2024  BUN 11 04/28/2024   NA 134 (L) 04/28/2024   K 3.8 04/28/2024   CL 102 04/28/2024   CO2 24 04/28/2024   Lab Results  Component Value Date   ALT 34 (H) 04/28/2024   AST 22 04/28/2024   ALKPHOS 97 10/02/2022   BILITOT 0.4 04/28/2024    Lab Results  Component Value Date   CHOL 183 10/29/2023   TRIG 344 (H) 10/29/2023   HDL 49 (L) 10/29/2023   LDLCALC 88 10/29/2023   Lab Results  Component Value Date   HAV POS (A) 12/12/2009   Lab Results  Component Value Date   HEPBSAG NEGATIVE 08/27/2011   HEPBSAB REACTIVE (A)  04/04/2022   Lab Results  Component Value Date   HCVAB NEG 12/12/2009   Lab Results  Component Value Date   CHLAMYDIAWP Negative 04/28/2024   N Negative 04/28/2024   Lab Results  Component Value Date   GCPROBEAPT NEGATIVE 03/01/2013   No results found for: QUANTGOLD  Assessment/Plan  #HIV/Asymptomatic #Spanish speaking -interpretor present -cd4 1k, VL nD 04/28/24 -Continue Odefsey (kept on current regimen as pt plans on getting pregnant) -F/U in 6 months   #Vaccination COVID declned Flu-today  PCV -20 needs Meningitis UTD HepA AB+ on 12/12/09 HEpB Ab series x 2 , AAB+ on 04/04/22 Tdap       10/10/22       # DM on metformin  -On metformin , managed by primary care   #Health maintenance -Quantiferon negative 10/29/23 -RPR NR 04/28/24 -HCV NR on 04/04/22->NV -GC urine negative on 04/28/24 -on pravastain  The 10-year ASCVD risk score (Arnett DK, et al., 2019) is: 1.2%   Values used to calculate the score:     Age: 5 years     Clincally relevant sex: Female     Is Non-Hispanic African American: No     Diabetic: Yes     Tobacco smoker: No     Systolic Blood Pressure: 126 mmHg     Is BP treated: No     HDL Cholesterol: 49 mg/dL     Total Cholesterol: 183 mg/dL      #Dysplasia screen female -followed by Gyn  -she has polycystic ovarian disease, followed by gyn    Loney Stank, MD [facility]   I have personally spent 41 minutes involved in face-to-face and non-face-to-face activities for this patient on the day of the visit. Professional time spent includes the following activities: Preparing to see the patient (review of tests), Obtaining and/or reviewing separately obtained history (admission/discharge record), Performing a medically appropriate examination and/or evaluation , Ordering medications/tests/procedures, referring and communicating with other health care professionals, Documenting clinical information  in the EMR, Independently interpreting results (not separately reported), Communicating results to the patient/family/caregiver, Counseling and educating the patient/family/caregiver and Care coordination (not separately reported).

## 2024-05-19 NOTE — Patient Instructions (Signed)
 Lab visit one week prior to followup

## 2024-11-03 ENCOUNTER — Other Ambulatory Visit: Payer: Self-pay

## 2024-11-17 ENCOUNTER — Encounter: Payer: Self-pay | Admitting: Internal Medicine
